# Patient Record
Sex: Female | Born: 1980
Health system: Southern US, Community
[De-identification: ages and names within clinical notes are randomized; demographics above are authoritative.]

## PROBLEM LIST (undated history)

## (undated) DIAGNOSIS — R519 Headache, unspecified: Secondary | ICD-10-CM

## (undated) DIAGNOSIS — F329 Major depressive disorder, single episode, unspecified: Secondary | ICD-10-CM

## (undated) DIAGNOSIS — F419 Anxiety disorder, unspecified: Secondary | ICD-10-CM

## (undated) DIAGNOSIS — N83209 Unspecified ovarian cyst, unspecified side: Secondary | ICD-10-CM

## (undated) DIAGNOSIS — K602 Anal fissure, unspecified: Secondary | ICD-10-CM

## (undated) DIAGNOSIS — R112 Nausea with vomiting, unspecified: Secondary | ICD-10-CM

## (undated) DIAGNOSIS — I7300 Raynaud's syndrome without gangrene: Secondary | ICD-10-CM

## (undated) DIAGNOSIS — Z9889 Other specified postprocedural states: Secondary | ICD-10-CM

## (undated) DIAGNOSIS — K219 Gastro-esophageal reflux disease without esophagitis: Secondary | ICD-10-CM

## (undated) DIAGNOSIS — R51 Headache: Secondary | ICD-10-CM

## (undated) DIAGNOSIS — K922 Gastrointestinal hemorrhage, unspecified: Secondary | ICD-10-CM

## (undated) DIAGNOSIS — D759 Disease of blood and blood-forming organs, unspecified: Secondary | ICD-10-CM

## (undated) DIAGNOSIS — F32A Depression, unspecified: Secondary | ICD-10-CM

## (undated) DIAGNOSIS — U071 COVID-19: Secondary | ICD-10-CM

## (undated) DIAGNOSIS — D649 Anemia, unspecified: Secondary | ICD-10-CM

## (undated) DIAGNOSIS — K5090 Crohn's disease, unspecified, without complications: Secondary | ICD-10-CM

## (undated) DIAGNOSIS — Z8489 Family history of other specified conditions: Secondary | ICD-10-CM

## (undated) DIAGNOSIS — D6859 Other primary thrombophilia: Secondary | ICD-10-CM

## (undated) HISTORY — PX: ILEOCECETOMY: SHX5857

## (undated) HISTORY — PX: ESOPHAGOGASTRODUODENOSCOPY ENDOSCOPY: SHX5814

## (undated) HISTORY — DX: Anemia, unspecified: D64.9

## (undated) HISTORY — DX: Major depressive disorder, single episode, unspecified: F32.9

## (undated) HISTORY — DX: Depression, unspecified: F32.A

## (undated) HISTORY — DX: Crohn's disease, unspecified, without complications: K50.90

## (undated) HISTORY — PX: WISDOM TOOTH EXTRACTION: SHX21

## (undated) HISTORY — DX: COVID-19: U07.1

## (undated) HISTORY — DX: Raynaud's syndrome without gangrene: I73.00

## (undated) HISTORY — DX: Anal fissure, unspecified: K60.2

## (undated) HISTORY — DX: Anxiety disorder, unspecified: F41.9

## (undated) HISTORY — DX: Gastrointestinal hemorrhage, unspecified: K92.2

## (undated) HISTORY — PX: COLONOSCOPY: SHX174

## (undated) HISTORY — DX: Unspecified ovarian cyst, unspecified side: N83.209

---

## 2002-04-15 ENCOUNTER — Emergency Department (HOSPITAL_COMMUNITY): Admission: EM | Admit: 2002-04-15 | Discharge: 2002-04-15 | Payer: Self-pay | Admitting: Emergency Medicine

## 2004-03-18 ENCOUNTER — Other Ambulatory Visit: Admission: RE | Admit: 2004-03-18 | Discharge: 2004-03-18 | Payer: Self-pay | Admitting: Obstetrics and Gynecology

## 2004-08-02 ENCOUNTER — Emergency Department (HOSPITAL_COMMUNITY): Admission: EM | Admit: 2004-08-02 | Discharge: 2004-08-02 | Payer: Self-pay | Admitting: Emergency Medicine

## 2005-03-04 ENCOUNTER — Encounter: Admission: RE | Admit: 2005-03-04 | Discharge: 2005-03-04 | Payer: Self-pay | Admitting: Internal Medicine

## 2005-03-17 ENCOUNTER — Emergency Department (HOSPITAL_COMMUNITY): Admission: EM | Admit: 2005-03-17 | Discharge: 2005-03-17 | Payer: Self-pay | Admitting: Emergency Medicine

## 2005-03-31 ENCOUNTER — Ambulatory Visit: Payer: Self-pay | Admitting: Internal Medicine

## 2005-04-19 ENCOUNTER — Ambulatory Visit: Payer: Self-pay | Admitting: Internal Medicine

## 2005-04-19 ENCOUNTER — Encounter (INDEPENDENT_AMBULATORY_CARE_PROVIDER_SITE_OTHER): Payer: Self-pay | Admitting: Specialist

## 2005-04-20 ENCOUNTER — Ambulatory Visit (HOSPITAL_COMMUNITY): Admission: RE | Admit: 2005-04-20 | Discharge: 2005-04-20 | Payer: Self-pay | Admitting: Internal Medicine

## 2005-05-05 ENCOUNTER — Ambulatory Visit: Payer: Self-pay | Admitting: Internal Medicine

## 2005-05-17 ENCOUNTER — Other Ambulatory Visit: Admission: RE | Admit: 2005-05-17 | Discharge: 2005-05-17 | Payer: Self-pay | Admitting: Obstetrics and Gynecology

## 2005-06-01 ENCOUNTER — Ambulatory Visit: Payer: Self-pay | Admitting: Internal Medicine

## 2005-07-13 ENCOUNTER — Ambulatory Visit: Payer: Self-pay | Admitting: Internal Medicine

## 2005-08-02 ENCOUNTER — Encounter (HOSPITAL_COMMUNITY): Admission: RE | Admit: 2005-08-02 | Discharge: 2005-09-08 | Payer: Self-pay | Admitting: Internal Medicine

## 2005-12-13 ENCOUNTER — Emergency Department (HOSPITAL_COMMUNITY): Admission: EM | Admit: 2005-12-13 | Discharge: 2005-12-14 | Payer: Self-pay | Admitting: Emergency Medicine

## 2006-06-23 ENCOUNTER — Inpatient Hospital Stay (HOSPITAL_COMMUNITY): Admission: AD | Admit: 2006-06-23 | Discharge: 2006-06-23 | Payer: Self-pay | Admitting: Obstetrics and Gynecology

## 2006-07-07 ENCOUNTER — Inpatient Hospital Stay (HOSPITAL_COMMUNITY): Admission: AD | Admit: 2006-07-07 | Discharge: 2006-07-07 | Payer: Self-pay | Admitting: Obstetrics and Gynecology

## 2006-07-11 ENCOUNTER — Inpatient Hospital Stay (HOSPITAL_COMMUNITY): Admission: AD | Admit: 2006-07-11 | Discharge: 2006-07-11 | Payer: Self-pay | Admitting: Obstetrics and Gynecology

## 2006-07-26 ENCOUNTER — Inpatient Hospital Stay (HOSPITAL_COMMUNITY): Admission: AD | Admit: 2006-07-26 | Discharge: 2006-07-26 | Payer: Self-pay | Admitting: *Deleted

## 2006-07-29 ENCOUNTER — Inpatient Hospital Stay (HOSPITAL_COMMUNITY): Admission: AD | Admit: 2006-07-29 | Discharge: 2006-07-29 | Payer: Self-pay | Admitting: *Deleted

## 2006-07-31 ENCOUNTER — Inpatient Hospital Stay (HOSPITAL_COMMUNITY): Admission: AD | Admit: 2006-07-31 | Discharge: 2006-07-31 | Payer: Self-pay | Admitting: Obstetrics and Gynecology

## 2006-08-08 ENCOUNTER — Inpatient Hospital Stay (HOSPITAL_COMMUNITY): Admission: AD | Admit: 2006-08-08 | Discharge: 2006-08-12 | Payer: Self-pay | Admitting: Obstetrics and Gynecology

## 2006-08-10 ENCOUNTER — Encounter (INDEPENDENT_AMBULATORY_CARE_PROVIDER_SITE_OTHER): Payer: Self-pay | Admitting: *Deleted

## 2006-11-22 ENCOUNTER — Ambulatory Visit: Payer: Self-pay | Admitting: Internal Medicine

## 2006-11-22 LAB — CONVERTED CEMR LAB
ALT: 22 units/L (ref 0–35)
AST: 18 units/L (ref 0–37)
Albumin: 3.5 g/dL (ref 3.5–5.2)
Alkaline Phosphatase: 86 units/L (ref 39–117)
BUN: 9 mg/dL (ref 6–23)
Basophils Absolute: 0.1 10*3/uL (ref 0.0–0.1)
Basophils Relative: 0.7 % (ref 0.0–1.0)
Bilirubin, Direct: 0.1 mg/dL (ref 0.0–0.3)
CO2: 31 meq/L (ref 19–32)
Calcium: 8.7 mg/dL (ref 8.4–10.5)
Chloride: 100 meq/L (ref 96–112)
Creatinine, Ser: 0.9 mg/dL (ref 0.4–1.2)
Eosinophils Absolute: 0.3 10*3/uL (ref 0.0–0.6)
Eosinophils Relative: 4.1 % (ref 0.0–5.0)
GFR calc Af Amer: 98 mL/min
GFR calc non Af Amer: 81 mL/min
Glucose, Bld: 83 mg/dL (ref 70–99)
HCT: 40.6 % (ref 36.0–46.0)
Hemoglobin: 13.6 g/dL (ref 12.0–15.0)
Lymphocytes Relative: 35.3 % (ref 12.0–46.0)
MCHC: 33.6 g/dL (ref 30.0–36.0)
MCV: 80.3 fL (ref 78.0–100.0)
Monocytes Absolute: 0.6 10*3/uL (ref 0.2–0.7)
Monocytes Relative: 7.1 % (ref 3.0–11.0)
Neutro Abs: 4.4 10*3/uL (ref 1.4–7.7)
Neutrophils Relative %: 52.8 % (ref 43.0–77.0)
Platelets: 363 10*3/uL (ref 150–400)
Potassium: 4.3 meq/L (ref 3.5–5.1)
RBC: 5.05 M/uL (ref 3.87–5.11)
RDW: 13.7 % (ref 11.5–14.6)
Sed Rate: 18 mm/hr (ref 0–25)
Sodium: 142 meq/L (ref 135–145)
Total Bilirubin: 0.5 mg/dL (ref 0.3–1.2)
Total Protein: 6.7 g/dL (ref 6.0–8.3)
WBC: 8.3 10*3/uL (ref 4.5–10.5)

## 2007-01-15 ENCOUNTER — Ambulatory Visit: Payer: Self-pay | Admitting: Internal Medicine

## 2007-01-19 ENCOUNTER — Encounter: Admission: RE | Admit: 2007-01-19 | Discharge: 2007-01-19 | Payer: Self-pay | Admitting: Internal Medicine

## 2007-09-03 DIAGNOSIS — Z8719 Personal history of other diseases of the digestive system: Secondary | ICD-10-CM | POA: Insufficient documentation

## 2007-09-03 DIAGNOSIS — D649 Anemia, unspecified: Secondary | ICD-10-CM | POA: Insufficient documentation

## 2007-09-03 DIAGNOSIS — F3289 Other specified depressive episodes: Secondary | ICD-10-CM | POA: Insufficient documentation

## 2007-09-03 DIAGNOSIS — N83209 Unspecified ovarian cyst, unspecified side: Secondary | ICD-10-CM | POA: Insufficient documentation

## 2007-09-03 DIAGNOSIS — F329 Major depressive disorder, single episode, unspecified: Secondary | ICD-10-CM | POA: Insufficient documentation

## 2007-09-03 DIAGNOSIS — J45909 Unspecified asthma, uncomplicated: Secondary | ICD-10-CM | POA: Insufficient documentation

## 2007-10-01 ENCOUNTER — Emergency Department (HOSPITAL_COMMUNITY): Admission: EM | Admit: 2007-10-01 | Discharge: 2007-10-01 | Payer: Self-pay | Admitting: Emergency Medicine

## 2007-11-26 ENCOUNTER — Telehealth: Payer: Self-pay | Admitting: Internal Medicine

## 2007-12-17 DIAGNOSIS — R197 Diarrhea, unspecified: Secondary | ICD-10-CM | POA: Insufficient documentation

## 2007-12-20 ENCOUNTER — Ambulatory Visit: Payer: Self-pay | Admitting: Internal Medicine

## 2007-12-21 LAB — CONVERTED CEMR LAB
Basophils Absolute: 0 10*3/uL (ref 0.0–0.1)
Basophils Relative: 0.5 % (ref 0.0–3.0)
Eosinophils Absolute: 0.1 10*3/uL (ref 0.0–0.7)
Eosinophils Relative: 1 % (ref 0.0–5.0)
HCT: 36.5 % (ref 36.0–46.0)
Hemoglobin: 12.5 g/dL (ref 12.0–15.0)
Iron: 20 ug/dL — ABNORMAL LOW (ref 42–145)
Lymphocytes Relative: 31.2 % (ref 12.0–46.0)
MCHC: 34.1 g/dL (ref 30.0–36.0)
MCV: 77.6 fL — ABNORMAL LOW (ref 78.0–100.0)
Monocytes Absolute: 0.9 10*3/uL (ref 0.1–1.0)
Monocytes Relative: 10.3 % (ref 3.0–12.0)
Neutro Abs: 4.8 10*3/uL (ref 1.4–7.7)
Neutrophils Relative %: 57 % (ref 43.0–77.0)
Platelets: 423 10*3/uL — ABNORMAL HIGH (ref 150–400)
RBC: 4.71 M/uL (ref 3.87–5.11)
RDW: 14 % (ref 11.5–14.6)
Saturation Ratios: 5.2 % — ABNORMAL LOW (ref 20.0–50.0)
Sed Rate: 37 mm/hr — ABNORMAL HIGH (ref 0–22)
Transferrin: 275.4 mg/dL (ref >=212.0)
Vitamin B-12: 229 pg/mL (ref 211–911)
WBC: 8.4 10*3/uL (ref 4.5–10.5)

## 2007-12-25 ENCOUNTER — Ambulatory Visit: Payer: Self-pay | Admitting: Internal Medicine

## 2008-01-03 ENCOUNTER — Telehealth: Payer: Self-pay | Admitting: Internal Medicine

## 2008-01-04 ENCOUNTER — Telehealth: Payer: Self-pay | Admitting: Internal Medicine

## 2008-01-25 ENCOUNTER — Ambulatory Visit: Payer: Self-pay | Admitting: Internal Medicine

## 2008-02-25 ENCOUNTER — Ambulatory Visit: Payer: Self-pay | Admitting: Internal Medicine

## 2008-03-26 ENCOUNTER — Ambulatory Visit: Payer: Self-pay | Admitting: Internal Medicine

## 2008-03-27 ENCOUNTER — Telehealth: Payer: Self-pay | Admitting: Internal Medicine

## 2008-04-28 ENCOUNTER — Ambulatory Visit: Payer: Self-pay | Admitting: Internal Medicine

## 2008-05-28 ENCOUNTER — Ambulatory Visit: Payer: Self-pay | Admitting: Internal Medicine

## 2008-06-09 ENCOUNTER — Ambulatory Visit: Payer: Self-pay | Admitting: Internal Medicine

## 2008-06-10 LAB — CONVERTED CEMR LAB
Basophils Absolute: 0 10*3/uL (ref 0.0–0.1)
Basophils Relative: 0.4 % (ref 0.0–3.0)
Eosinophils Absolute: 0.1 10*3/uL (ref 0.0–0.7)
Eosinophils Relative: 2.6 % (ref 0.0–5.0)
HCT: 37.8 % (ref 36.0–46.0)
Hemoglobin: 12.9 g/dL (ref 12.0–15.0)
Iron: 30 ug/dL — ABNORMAL LOW (ref 42–145)
Lymphocytes Relative: 29.6 % (ref 12.0–46.0)
MCHC: 34 g/dL (ref 30.0–36.0)
MCV: 79.5 fL (ref 78.0–100.0)
Monocytes Absolute: 0.6 10*3/uL (ref 0.1–1.0)
Monocytes Relative: 10 % (ref 3.0–12.0)
Neutro Abs: 3.2 10*3/uL (ref 1.4–7.7)
Neutrophils Relative %: 57.4 % (ref 43.0–77.0)
Platelets: 264 10*3/uL (ref 150–400)
RBC: 4.76 M/uL (ref 3.87–5.11)
RDW: 15.1 % — ABNORMAL HIGH (ref 11.5–14.6)
Saturation Ratios: 7.9 % — ABNORMAL LOW (ref 20.0–50.0)
Sed Rate: 20 mm/hr (ref 0–22)
Transferrin: 270.6 mg/dL (ref >=212.0)
Vitamin B-12: 380 pg/mL (ref 211–911)
WBC: 5.6 10*3/uL (ref 4.5–10.5)

## 2008-06-11 ENCOUNTER — Telehealth: Payer: Self-pay | Admitting: Internal Medicine

## 2008-06-18 ENCOUNTER — Ambulatory Visit: Payer: Self-pay | Admitting: Internal Medicine

## 2008-09-17 ENCOUNTER — Telehealth: Payer: Self-pay | Admitting: Internal Medicine

## 2009-01-01 ENCOUNTER — Encounter: Payer: Self-pay | Admitting: Internal Medicine

## 2009-06-19 ENCOUNTER — Telehealth: Payer: Self-pay | Admitting: Internal Medicine

## 2009-06-22 ENCOUNTER — Ambulatory Visit: Payer: Self-pay | Admitting: Internal Medicine

## 2009-06-22 DIAGNOSIS — K602 Anal fissure, unspecified: Secondary | ICD-10-CM | POA: Insufficient documentation

## 2009-06-22 LAB — CONVERTED CEMR LAB
ALT: 17 units/L (ref 0–35)
AST: 18 units/L (ref 0–37)
Albumin: 3.7 g/dL (ref 3.5–5.2)
Alkaline Phosphatase: 78 units/L (ref 39–117)
BUN: 4 mg/dL — ABNORMAL LOW (ref 6–23)
Basophils Absolute: 0 10*3/uL (ref 0.0–0.1)
Basophils Relative: 0.4 % (ref 0.0–3.0)
CO2: 27 meq/L (ref 19–32)
Calcium: 8.9 mg/dL (ref 8.4–10.5)
Chloride: 105 meq/L (ref 96–112)
Creatinine, Ser: 0.7 mg/dL (ref 0.4–1.2)
Eosinophils Absolute: 0.1 10*3/uL (ref 0.0–0.7)
Eosinophils Relative: 1.6 % (ref 0.0–5.0)
GFR calc non Af Amer: 105.75 mL/min (ref >60)
Glucose, Bld: 84 mg/dL (ref 70–99)
HCT: 40.5 % (ref 36.0–46.0)
Hemoglobin: 13.4 g/dL (ref 12.0–15.0)
Iron: 29 ug/dL — ABNORMAL LOW (ref 42–145)
Lymphocytes Relative: 25.4 % (ref 12.0–46.0)
Lymphs Abs: 1.9 10*3/uL (ref 0.7–4.0)
MCHC: 33.1 g/dL (ref 30.0–36.0)
MCV: 80.1 fL (ref 78.0–100.0)
Monocytes Absolute: 0.5 10*3/uL (ref 0.1–1.0)
Monocytes Relative: 6.5 % (ref 3.0–12.0)
Neutro Abs: 4.8 10*3/uL (ref 1.4–7.7)
Neutrophils Relative %: 66.1 % (ref 43.0–77.0)
Platelets: 335 10*3/uL (ref 150.0–400.0)
Potassium: 3.8 meq/L (ref 3.5–5.1)
RBC: 5.05 M/uL (ref 3.87–5.11)
RDW: 14.5 % (ref 11.5–14.6)
Saturation Ratios: 8.5 % — ABNORMAL LOW (ref 20.0–50.0)
Sed Rate: 39 mm/hr — ABNORMAL HIGH (ref 0–22)
Sodium: 139 meq/L (ref 135–145)
Total Bilirubin: 0.6 mg/dL (ref 0.3–1.2)
Total Protein: 7.7 g/dL (ref 6.0–8.3)
Transferrin: 242.6 mg/dL (ref 212.0–360.0)
Vitamin B-12: 333 pg/mL (ref 211–911)
WBC: 7.3 10*3/uL (ref 4.5–10.5)

## 2009-07-07 ENCOUNTER — Telehealth: Payer: Self-pay | Admitting: Internal Medicine

## 2009-07-20 ENCOUNTER — Telehealth: Payer: Self-pay | Admitting: Internal Medicine

## 2009-08-18 ENCOUNTER — Telehealth: Payer: Self-pay | Admitting: Internal Medicine

## 2009-09-24 ENCOUNTER — Telehealth: Payer: Self-pay | Admitting: Internal Medicine

## 2010-04-24 ENCOUNTER — Inpatient Hospital Stay (HOSPITAL_COMMUNITY): Admission: AD | Admit: 2010-04-24 | Discharge: 2010-04-25 | Payer: Self-pay | Admitting: Obstetrics and Gynecology

## 2010-04-25 ENCOUNTER — Inpatient Hospital Stay (HOSPITAL_COMMUNITY)
Admission: AD | Admit: 2010-04-25 | Discharge: 2010-04-26 | Payer: Self-pay | Source: Home / Self Care | Admitting: Obstetrics and Gynecology

## 2010-06-19 ENCOUNTER — Encounter: Payer: Self-pay | Admitting: Internal Medicine

## 2010-06-29 NOTE — Assessment & Plan Note (Signed)
Summary: LAB F/U.Marland KitchenMarland KitchenAS.       (11:15AM APPT)    History of Present Illness Visit Type: follow up Primary GI MD: [name] MD Primary Provider: Martha Clan, M.D. Chief Complaint: Rectal bleeding, BRB after BM History of Present Illness:   This is a 30 year old white female with Crohn's colitis diagnosed in 2006. Her last colonoscopy in November 2006 showed  active Crohn's disease at the ileocecal valve. Random biopsies of the normal-appearing colon showed granulomas. Patient has had a recent flare up of rectal bleeding and diarrhea which has now responded to Entocort 9 mg a day. She has been on it for one week and the bleeding and diarrhea have improved. In the past, the patient was reluctant to go on 6 MP because of potential side effects. She continues on Pentasa 250 mg 8 tablets a day. She has severe iron deficiency with an iron saturation of 7% but a normal hemoglobin of 12.9 and hematocrit 37.8 on 06/09/08. Patient is intolerant to oral iron and can take only prenatal vitamins and iron. She had an allergic reaction to her iron dextran infusion.   GI Review of Systems    Reports nausea.      Denies abdominal pain, acid reflux, belching, bloating, chest pain, dysphagia with liquids, dysphagia with solids, heartburn, loss of appetite, vomiting, vomiting blood, weight loss, and  weight gain.      Reports diarrhea, hemorrhoids, and  rectal bleeding.     Denies anal fissure, black tarry stools, change in bowel habit, constipation, diverticulosis, fecal incontinence, heme positive stool, irritable bowel syndrome, jaundice, light color stool, liver problems, and  rectal pain.     Prior Medications Reviewed Using: Patient Recall  Updated Prior Medication List: ENTOCORT EC 3 MG  CP24 (BUDESONIDE) Take 3 capsules by mouth once daily PENTASA 250 MG  CR-CAPS (MESALAMINE) 8 tablets by mouth once daily BENTYL 10 MG  CAPS (DICYCLOMINE HCL) 1 tablet up to 3 once daily as needed  Current Allergies  (reviewed today): ! SULFA ! IRON DEXTRAN COMPLEX (IRON DEXTRAN)  Past Medical History:    Reviewed history from 09/03/2007 and no changes required:       Current Problems:        OVARIAN CYST (ICD-620.2)       ANEMIA (ICD-285.9)       ASTHMA (ICD-493.90)       DEPRESSION (ICD-311)       CROHN'S DISEASE, HX OF (ICD-V12.70)         Past Surgical History:    Reviewed history from 12/17/2007 and no changes required:       unremarkable   Family History:    Reviewed history from 12/17/2007 and no changes required:       Family History of Colon Cancer: Uncle       Family History of Heart Disease: Grandfather  Social History:    Reviewed history from 12/20/2007 and no changes required:       Occupation: Librarian, academic       Alcohol Use - no       Illicit Drug Use - no       Patient has never smoked.        Daily Caffeine Use    Review of Systems       Pertinent positive and negative review of systems were noted in the above HPI. All other ROS was otherwise negative.    Vital Signs:  Patient Profile:   30 Years Old Female Height:  62 inches Weight:      118 pounds BMI:     21.66 BSA:     1.53 Pulse rate:   68 / minute Pulse rhythm:   regular BP sitting:   102 / 74  (left arm) Cuff size:   regular  Vitals Entered By: Teresita Madura CMA (June 18, 2008 11:23 AM)                  Physical Exam  General:     alert, oriented. in no distress. Lungs:     Clear throughout to auscultation. Heart:     Regular rate and rhythm; no murmurs, rubs or bruits. Abdomen:     soft abdomen with no tenderness, normoactive bowel sounds. she has an umbilical ring and  tattoo in the right lower quadrant. Rectal:     normal rectal tone. Anoscopic exam shows normal rectal mucosa and no hemorrhoids. Stool is Hemoccult negative and there is no obvious source of bleeding.    Impression & Recommendations:  Problem # 1:  CROHN'S DISEASE, HX OF (ICD-V12.70) Crohn's disease  of the ileocecal valve and the colon responding to Entocort 9 mg a day. Patient seems to be steroid-dependent. She will continue on 9 mg a day for 8 weeks, 6 mg a day for a week, 30 mg a day for 8 weeks. She will be finished by July 2010. We will then consider starting her on 6 mercaptopurine. Another option would be biologicals. She needs to continue her Pentasa and iron supplements.  Problem # 2:  ANEMIA (ICD-285.9) 7% iron saturation. Patient is intolerant to oral and intravenous iron. She will double up on her prenatal vitamins. I would like her to consider birth-control pills for menorrhagia  to minimize the blood loss. She is a patient at  [facility].   Patient Instructions: 1)  patient will ask her gynecologist to consider treating her menorrhagia 2)  Continue prenatal vitamins twice a day. 3)  Continue Pentasa 250 mg 1 g twice a day. 4)  Continue on Entocort 9 mg a day and taper as per schedule. 5)  Office visit 6 months. 6)  Consider 6-mercaptopurine. 7)  Copy Sent To:Rocco Pauls, Dr. Cyndie Mull

## 2010-06-29 NOTE — Assessment & Plan Note (Signed)
Summary: B12 INJECTION...AS.  Nurse Visit    Prior Medications: ENTOCORT EC 3 MG  CP24 (BUDESONIDE) Take 3 capsules by mouth once daily PENTASA 250 MG  CR-CAPS (MESALAMINE) 8 tablets by mouth once daily BENTYL 10 MG  CAPS (DICYCLOMINE HCL) 1 tablet up to 3 once daily as needed PREDNISONE 10 MG  TABS (PREDNISONE) take three tabs by mouth once daily for two weeks METRONIDAZOLE 250 MG  TABS (METRONIDAZOLE) take one by mouth three times a day Current Allergies: ! SULFA ! IRON DEXTRAN COMPLEX (IRON DEXTRAN)    Medication Administration  Injection # 1:    Medication: Vit B12 1000 mcg    Diagnosis: ANEMIA (ICD-285.9)    Route: IM    Site: L deltoid    Exp Date: 10/28/2009    Lot #: [id number]    Mfr: [employer] Regent    Comments: Next B12 scheduled for 03/26/08 at 8:30am    Patient tolerated injection without complications    Given by: Christie Nottingham CMA (February 25, 2008 8:37 AM)  Orders Added: 1)  Vit B12 1000 mcg [email or web address]    ]

## 2010-06-29 NOTE — Progress Notes (Signed)
Summary: TRIAGE-Abdominal Pain   Phone Note Call from Patient Call back at Home Phone [phone]   Caller: Patient Summary of Call: patient complains of right side abdominal pain that started yesterday. She is also preg. but she does not feel that it is related to her preg she is trying to decide if she should to the ER. She just saw the OB this AM and told her about the pains.  Initial call taken by: Harlow Mares CMA Duncan Dull),  September 24, 2009 12:13 PM  Follow-up for Phone Call        Since yesterday pt. c/o intermittent RUQ pain. Pain is worse when she eats. Some nausea/vomited this morning, thinks this may be from pregnancy. Cold/shaking chills. Bentyl helps somewhat. OBGYN seen this morning, no new orders.  Pt. also c/o severe constipation from the Zofran.  DR.Angeletta Goelz PLEASE ADVISE  Follow-up by: Laureen Ochs LPN,  September 24, 2009 12:21 PM  Additional Follow-up for Phone Call Additional follow up Details #1::        Spoke with the pt, she left work, RLQ pain since yesteday. Has been on more fiber for the "baby" , [redacted] wks pregnant, also constipated from Zofran. We discussed it, ? Entecort, will restart 9 mg/day. She has some at home. Additional Follow-up by: Hart Carwin MD,  September 24, 2009 1:19 PM

## 2010-06-29 NOTE — Assessment & Plan Note (Signed)
Summary: b12inj  Nurse Visit    Medication Administration  Injection # 1:    Medication: Vit B12 1000 mcg    Diagnosis: ANEMIA (ICD-285.9)    Route: IM    Site: R deltoid    Exp Date: 10/2009    Lot #: [id number]    Mfr: [employer] Regent    Comments: Next injection scheduled for 02/25/08    Patient tolerated injection without complications    Given by: Francee Piccolo CMA (January 25, 2008 8:45 AM)  Orders Added: 1)  Vit B12 1000 mcg [email or web address]    ]

## 2010-06-29 NOTE — Progress Notes (Signed)
Summary: TRIAGE--Abd Pain   Phone Note Call from Patient Call back at Home Phone [phone] Call back at Work Phone [phone] Call back at Try work first please   Call For: Dr Juanda Chance Summary of Call: Really bad stomach pains x3days not getting better with the medicines she has. Gets nauseaus with everything she eats. Initial call taken by: Leanor Kail [facility],  September 17, 2008 8:06 AM  Follow-up for Phone Call        Pt. having abd. pain, "my entire stomache cramps up" Usually happens during or following a meal. Also severe nausea, may last up to 2 hours. Denies fever, constipation, diarrhea, blood,black stools. Takes Pentasa 8 tabs daily, Bentyl 10mg  three times a day. Had tapered off Entocort, but resumed it yesterday. Pt. states her Mother and sister had these same symptoms and ended up having their gallbladders removed. DR.Christyl Osentoski PLEASE ADVISE Follow-up by: Laureen Ochs LPN,  September 17, 2008 8:37 AM  Additional Follow-up for Phone Call Additional follow up Details #1::        Upper abd. sono 12/2006 was negative, so I don't think it is her gall bladder. I agree with Entecort 3 mg daily. for several weeks      Appended Document: TRIAGE--Abd Pain Above MD order reviewed w/pt.  She will continue above meds., follow a soft,bland diet and callback as needed.

## 2010-06-29 NOTE — Progress Notes (Signed)
Summary: TRIAGE  Medications Added ANUSOL-HC 2.5 %  CREA (HYDROCORTISONE) Apply to rectum up to 4 times daily-as needed.       Phone Note Call from Patient Call back at Home Phone [phone]   Caller: Patient Call For: Juanda Chance Reason for Call: Talk to Nurse Summary of Call: Patient states that she has hemorroids and would like an rx called in to [facility] [address] in [place]. Initial call taken by: Tawni Levy,  June 19, 2009 8:07 AM  Follow-up for Phone Call        Message left for patient to callback.Laureen Ochs LPN  June 19, 2009 9:01 AM   Pt. c/o  external hemorrhoids for three days, they are painful, bleeding. States this is not a Crohn's flare up.   1) Anusol HC cream to rectum QID as needed 2) Sitz bath QID 3)See Dr.Cristalle Rohm on 06-22-09 ar 10:15am 4) If symptoms become worse call back immediately or go to ER.  Follow-up by: Laureen Ochs LPN,  June 19, 2009 2:48 PM  Additional Follow-up for Phone Call Additional follow up Details #1::        reviewed and agree Additional Follow-up by: Hart Carwin MD,  June 19, 2009 9:40 PM    New/Updated Medications: ANUSOL-HC 2.5 %  CREA (HYDROCORTISONE) Apply to rectum up to 4 times daily-as needed. Prescriptions: ANUSOL-HC 2.5 %  CREA (HYDROCORTISONE) Apply to rectum up to 4 times daily-as needed.  #1 x 1   Entered by:   Laureen Ochs LPN   Authorized by:   Hart Carwin MD   Signed by:   Laureen Ochs LPN on 09/81/1914   Method used:   Electronically to        [facility] [address] (retail)       [address] [place], [place]  [address]       Ph: [phone]       Fax: [phone]   RxID:   [id number]

## 2010-06-29 NOTE — Procedures (Signed)
Summary: COLON PATH   Colonoscopy  Procedure date:  04/24/2005  Findings:      Location:  [facility].    SP Surgical Pathology - STATUS: Final             By: SMIR MD , Jessica Priest           Perform Date: 21Nov06 00:01  Ordered By: Juanda Chance MD , Ulysees Robarts M            Ordered Date: 21Nov06 21:36  Facility: [facility]                               Department: CPATH  Service Report Text  [facility]   [address] [address]   [place], [place] [address]   Telephone [phone] or [phone] Fax [phone]    REPORT OF SURGICAL PATHOLOGY    Case #: [id number]   Patient Name: Sophia Beard, Sophia Beard   PID: [id number]   Pathologist: Havery Moros, MD   DOB/Age [birth year] (Age: 30) Gender: F   Date Taken: 04/19/2005   Date Received: 04/19/2005    FINAL DIAGNOSIS    ***MICROSCOPIC EXAMINATION AND DIAGNOSIS***    1. ILEOCECAL VALVE, BIOPSY: ACTIVE INFLAMMATION, SEE COMMENT    2. COLON, RANDOM BIOPSIES: BENIGN COLONIC MUCOSA WITH A SMALL   GRANULOMA, SEE COMMENT    COMMENT   1. Sections of the ileocecal valve show active inflammation in   the form of increased numbers of neutrophils in the lamina   propria and epithelium associated with cryptitis and focal crypt   abscesses. There is a small focus displaying multinucleated giant   cell possibly representing a small granuloma.    2. The sections show benign colonic mucosa with lack of   significant inflammation. Nonetheless there is a small focus of   epithelioid granuloma present in the lamina propria. Other areas   display loose aggregates of histiocytes.    Given the combined features, the findings favor Crohn' s disease.   However, clinical and endoscopic correlation is strongly   recommended. (BNS:kcv 04-20-05)    kv   Date Reported: 04/20/2005 Havery Moros, MD   *** Electronically Signed Out By BNS ***    Clinical information   (+) heme in stools / abdominal pain   1,2. R/O Crohn' s disease  (tw)    specimen(s) obtained   1: Ileocecal valve, biopsy   2: Colon, biopsy, random    Gross Description   1. Received in formalin are tan, soft tissue fragments that are   submitted in toto. Number: multiple   Size: 0.1 to 0.4 cm, 1 block submitted.    2. Received in formalin are tan, soft tissue fragments that are   submitted in toto. Number: multiple   Size: 0.4 to 1.1 cm, 1 block submitted. (lc:tw 04/19/05)

## 2010-06-29 NOTE — Progress Notes (Signed)
Summary: ABD PAIN   Phone Note Call from Patient Call back at Home Phone [phone]   Call For: BR Doc Mandala Reason for Call: Talk to Nurse Summary of Call: HAVING RALLY BAD ABD PAIN X4 DAYS GETTING WORSE. TAKING ALL HER MEDS-PENTASA ENTICORT DICICLOMINE NOT REALLY HELPING  Follow-up for Phone Call        last couple days really bad cramping all the way across after eating, taking all meds and not helping. Rectal pain when BMS, alot of diarrhea 3-4 times a day. blood on tissue when wiping. Pt states that she is taking Bentyl three times a day but it is not helping at all. no fever alot of cramping in mid abd from left to right. Follow-up by: Harlow Mares CMA,  January 03, 2008 11:15 AM  Additional Follow-up for Phone Call Additional follow up Details #1::        Stop Entecort, and start Prednisone 30mg /day for 2 weeks, then call, Please, also call in Flagyl 250 mg three times a day x 1 week,# 21 Additional Follow-up by: Hart Carwin MD,  January 03, 2008 5:32 PM        Appended Document: ABD PAIN see next phone note.Marland Kitchen all addressed with the pt.

## 2010-06-29 NOTE — Assessment & Plan Note (Signed)
Summary: b12 injection  Nurse Visit    Prior Medications: ENTOCORT EC 3 MG  CP24 (BUDESONIDE) Take 3 capsules by mouth once daily PENTASA 250 MG  CR-CAPS (MESALAMINE) 8 tablets by mouth once daily BENTYL 10 MG  CAPS (DICYCLOMINE HCL) 1 tablet up to 3 once daily as needed PREDNISONE 10 MG  TABS (PREDNISONE) take three tabs by mouth once daily for two weeks METRONIDAZOLE 250 MG  TABS (METRONIDAZOLE) take one by mouth three times a day Current Allergies: ! SULFA ! IRON DEXTRAN COMPLEX (IRON DEXTRAN)    Medication Administration  Injection # 1:    Medication: Vit B12 1000 mcg    Diagnosis: ANEMIA (ICD-285.9)    Route: IM    Site: R deltoid    Exp Date: 8/11    Lot #: [id number]    Mfr: [employer] Regent    Patient tolerated injection without complications    Given by: Hortense Ramal CMA (May 28, 2008 10:34 AM)  Orders Added: 1)  Vit B12 1000 mcg [email or web address]    ]

## 2010-06-29 NOTE — Assessment & Plan Note (Signed)
Summary: PAINFUL,BLEEDING EXTERNAL HEMORRHOIDS             DEBORAH    History of Present Illness Visit Type: Follow-up Visit Primary GI MD: [name] MD Primary Provider: Martha Clan, M.D. Chief Complaint: Patient complains of rectal pain and BRB associated with hemorroids. She has been having this problems for about one week. She has also been having some diarrhea. Patient stopped Pentasa about 6 months ago.   History of Present Illness:   This is a 30 year old white female with Crohn's colitis diagnosed in 2006. Her last colonoscopy in November 2006 showed active Crohn's disease at the ileocecal valve. Random biopsies of the normal-appearing colon showed granulomas. She has severe iron deficiency with her last iron saturation being 7% with a normal hemoglobin of 12.9 and hematocrit 37.8 on 06/09/08. Patient is intolerant to oral iron and can take only prenatal vitamins and iron. She had an allergic reaction to her iron dextran infusion. She has been having multiple arthralgias involving her wrists, elbows, knees and ankles. She has an appointment with her rheumatologist in March. There has been increasing diarrhea and crampy abdominal pain. She denies any rectal bleeding. Her last flareup of Crohn's disease was in April 2010. Her last colonoscopy was in 2006 and showed active Crohn's disease at the ileocecal valve and granulomas throughout the colon. She is interested in getting pregnant again and is asking for approval as far as her medications are concerned.   GI Review of Systems      Denies abdominal pain, acid reflux, belching, bloating, chest pain, dysphagia with liquids, dysphagia with solids, heartburn, loss of appetite, nausea, vomiting, vomiting blood, weight loss, and  weight gain.      Reports diarrhea, hemorrhoids, rectal bleeding, and  rectal pain.     Denies anal fissure, black tarry stools, change in bowel habit, constipation, diverticulosis, fecal incontinence, heme positive  stool, irritable bowel syndrome, jaundice, light color stool, and  liver problems.    Current Medications (verified): 1)  Bentyl 10 Mg  Caps (Dicyclomine Hcl) .Marland Kitchen.. 1 Tablet Up To 3 Once Daily As Needed 2)  Anusol-Hc 2.5 %  Crea (Hydrocortisone) .... Apply To Rectum Up To 4 Times Daily-As Needed. 3)  Prenatal Plus/iron 27-1 Mg Tabs (Prenatal Vit-Fe Fumarate-Fa) .... Take One By Mouth Once Daily 4)  Ibuprofen 200 Mg Tabs (Ibuprofen) .... Take One By Mouth As Needed  Allergies (verified): 1)  ! Sulfa 2)  ! Iron Dextran Complex (Iron Dextran)  Past History:  Past Medical History: Reviewed history from 09/03/2007 and no changes required. Current Problems:  OVARIAN CYST (ICD-620.2) ANEMIA (ICD-285.9) ASTHMA (ICD-493.90) DEPRESSION (ICD-311) CROHN'S DISEASE, HX OF (ICD-V12.70)  Past Surgical History: Reviewed history from 12/17/2007 and no changes required. unremarkable  Family History: Family History of Colon Cancer: paternal Uncle Family History of Heart Disease: paternal Grandfather  Social History: Occupation: Librarian, academic Alcohol Use - no Illicit Drug Use - no Patient has never smoked.  Daily Caffeine Use 2 cans per day  Review of Systems       The patient complains of arthritis/joint pain, menstrual pain, muscle pains/cramps, skin rash, and swelling of feet/legs.  The patient denies allergy/sinus, anemia, anxiety-new, back pain, blood in urine, breast changes/lumps, change in vision, confusion, cough, coughing up blood, depression-new, fainting, fatigue, fever, headaches-new, hearing problems, heart murmur, heart rhythm changes, itching, night sweats, nosebleeds, pregnancy symptoms, shortness of breath, sleeping problems, sore throat, swollen lymph glands, thirst - excessive , urination - excessive ,  urination changes/pain, urine leakage, vision changes, and voice change.         Pertinent positive and negative review of systems were noted in the above HPI. All other ROS  was otherwise negative.   Vital Signs:  Patient profile:   30 year old female Height:      62 inches Weight:      120.8 pounds BMI:     22.17 Pulse rate:   89 / minute Pulse rhythm:   regular BP sitting:   110 / 66  (left arm) Cuff size:   regular  Vitals Entered By: Harlow Mares CMA Duncan Dull) (June 22, 2009 10:47 AM)  Physical Exam  General:  Well developed, well nourished, no acute distress. Eyes:  PERRLA, no icterus. Mouth:  healed aphthous ulcers. Neck:  Supple; no masses or thyromegaly. Lungs:  Clear throughout to auscultation. Heart:  Regular rate and rhythm; no murmurs, rubs,  or bruits. Abdomen:  soft abdomen but epigastric tenderness and normoactive bowel sounds. No distention. The right lower quadrant, left lower quadrant on normal. Rectal:  rectal and anoscopic exam reveals short  anal fissure at  3 o'clock, 1 cm mlong  with no active bleeding. Small internal hemorrhoids not active. Stool is Hemoccult negative. Extremities:  no edema no abnormalities of the joints. Skin:  Intact without significant lesions or rashes. Psych:  Alert and cooperative. Normal mood and affect.   Impression & Recommendations:  Problem # 1:  CROHN'S DISEASE, HX OF (ICD-V12.70) Patient hs Crohn's colitis of the ileocecal valve confirmed by a colonoscopy in 2006. Now, she is having an exacerbation of arthritis. She has had GI symptoms as well as aphthous stomatitis. We will restart Entocort 9 mg daily and consider adding Pentasa. We will obtain lab tests today as well. She has an appointment with DR Peggye Form  in March. In terms of her getting pregnant, none of the medications that she is taking are contraindicated for conception. I will try to taper down her steroids if she does become pregnant. Orders: TLB-CBC Platelet - w/Differential (85025-CBCD) TLB-CMP (Comprehensive Metabolic Pnl) (80053-COMP) TLB-Sedimentation Rate (ESR) (85652-ESR) TLB-IBC Pnl (Iron/FE;Transferrin)  (83550-IBC) TLB-B12, Serum-Total ONLY (98119-J47)  Problem # 2:  ANAL FISSURE (ICD-565.0) This may be part of her Crohn's disease. We will continue Anusol 2.5% cream 2-3 times a day.  Problem # 3:  ANEMIA (ICD-285.9) check CBC today Orders: TLB-IBC Pnl (Iron/FE;Transferrin) (83550-IBC) TLB-B12, Serum-Total ONLY (82956-O13)  Patient Instructions: 1)  refill Bentyl 10 mg 3 times a day. 2)  Entocort 9 mg daily x 4 weeks 6 mg daily x 4 weeks then 30 mg daily x 4 weeks. 3)  CBC, iron studies, B12, sedimentation rate and metabolic panel. 4)  Consider adding Pentasa. 5)  Copy sent to : Dr D.Clelia Croft Prescriptions: BENTYL 10 MG  CAPS (DICYCLOMINE HCL) 1 tablet up to 3 tablets by mouth daily as needed  #90 x 1   Entered by:   Hortense Ramal CMA (AAMA)   Authorized by:   Hart Carwin MD   Signed by:   Hortense Ramal CMA (AAMA) on 06/22/2009   Method used:   Electronically to        [facility] [phone][address] (retail)       [address] [place], [place]  [address]       Ph: [phone]       Fax: [phone]   RxID:   [id number] ENTOCORT EC 3 MG XR24H-CAP (BUDESONIDE) Take  3 tablets po qd x 4 weeks. Then, take 2 tablets po qd x 4 weeks. Then, take 1 tablet po once qd x 4 weeks.  #180 x 1   Entered by:   Hortense Ramal CMA (AAMA)   Authorized by:   Hart Carwin MD   Signed by:   Hortense Ramal CMA (AAMA) on 06/22/2009   Method used:   Electronically to        [facility] [phone] (retail)       [address] [place], [place]  [address]       Ph: [phone]       Fax: [phone]   RxID:   [id number]

## 2010-06-29 NOTE — Progress Notes (Signed)
Summary: TRIAGE-Diarrhea   Phone Note Call from Patient Call back at Home Phone [phone]   Caller: Patient Reason for Call: Acute Illness Details for Reason: prob Summary of Call: HAVING PROBLEMS WITH DIARRHEA. IMMODIUM NOT WORKING. -PLEASE ADVISE. Initial call taken by: Zackery Barefoot,  November 26, 2007 3:29 PM  Follow-up for Phone Call        Mesage left for patient to callback. Laureen Ochs LPN  November 26, 2007 3:42 PM           Appended Document: TRIAGE-Diarrhea (Pt. has Crohn's.)  X3 weeks--watery stools,frequency, urgency.occ. abd.pain.  No blood, no mucus, no fever. Using Immodium 2-3 x daily, Bentyl on occasion.Had stopped Pentasa, restarted it "a couple days ago", now taking it #4 tabs two times a day. 1) Soft,bland diet. No spicy,greasy,fried foods. Avoid salads and raw fruit/veggies. Advance diet as tolerated. 2) take Pentasa exactly as directed by Dr.Fabyan Loughmiller 3) take Bentyl three times a day for 5 days, then back to as needed. 4) Immodium 2 QAM and as needed throughout the day. 5) I will call pt., if new orders, after MD reviews.   Appended Document: TRIAGE-Diarrhea I have spoken to the patient, she has been having diarrhea now for several wks, even at night. She has been back on Pentasa 8/day and antispasmodis.    Please call in Entecort 3mg , # 90 3 by mouth once daily x 4 weeks Please make appointm about 4 wkks she will call if not better in 2 weeks  Appended Document: TRIAGE-Diarrhea Med. to pharmacy. Appt. scheduled for 12-20-07 at 2pm. Pt. will callback if no better.

## 2010-06-29 NOTE — Procedures (Signed)
Summary: COLON  Gastroenterology   Imported By: Donneta Romberg 09/04/2007 16:15:28  _____________________________________________________________________  External Attachment:    Type:   Image     Comment:   External Document

## 2010-06-29 NOTE — Progress Notes (Signed)
Summary: f-up  Medications Added PREDNISONE 10 MG  TABS (PREDNISONE) take three tabs by mouth once daily for two weeks METRONIDAZOLE 250 MG  TABS (METRONIDAZOLE) take one by mouth three times a day       Phone Note Call from Patient Call back at Home Phone [phone]   Caller: Patient Call For: Talecia Sherlin Reason for Call: Talk to Nurse Details for Reason: f-up Summary of Call: AWAITING on call back from nurse re: yesterdays ph conversation Initial call taken by: Guadlupe [facility],  January 04, 2008 8:16 AM  Follow-up for Phone Call        rxs sent pt aware, she will call back if not better Follow-up by: Harlow Mares CMA,  January 04, 2008 11:45 AM    New/Updated Medications: PREDNISONE 10 MG  TABS (PREDNISONE) take three tabs by mouth once daily for two weeks METRONIDAZOLE 250 MG  TABS (METRONIDAZOLE) take one by mouth three times a day   Prescriptions: METRONIDAZOLE 250 MG  TABS (METRONIDAZOLE) take one by mouth three times a day  #21 x 0   Entered by:   Harlow Mares CMA   Authorized by:   Hart Carwin MD   Signed by:   Harlow Mares CMA on 01/04/2008   Method used:   Electronically sent to ...       [facility] [address] [phone]*       [address]       [place], [place]  [address]       Ph: [phone] or [phone]       Fax: [phone]   RxID:   [id number] PREDNISONE 10 MG  TABS (PREDNISONE) take three tabs by mouth once daily for two weeks  #50 x 0   Entered by:   Harlow Mares CMA   Authorized by:   Hart Carwin MD   Signed by:   Harlow Mares CMA on 01/04/2008   Method used:   Electronically sent to ...       [facility] [address] [phone]*       [address]       [place], [place]  [address]       Ph: [phone] or [phone]       Fax: [phone]   RxID:   [id number]

## 2010-06-29 NOTE — Assessment & Plan Note (Signed)
Summary: #1 of 6 B12 injections. Pt. will schedule next one at this ap...  Nurse Visit    Prior Medications: ENTOCORT EC 3 MG  CP24 (BUDESONIDE) Take 3 capsules by mouth once daily PENTASA 250 MG  CR-CAPS (MESALAMINE) 8 tablets by mouth once daily BENTYL 10 MG  CAPS (DICYCLOMINE HCL) 1 tablet up to 3 once daily as needed Current Allergies: ! SULFA ! IRON DEXTRAN COMPLEX (IRON DEXTRAN)    Medication Administration  Injection # 1:    Medication: Vit B12 1000 mcg    Diagnosis: ANEMIA (ICD-285.9)    Route: IM    Site: L deltoid    Exp Date: 05/30/2009    Lot #: [id number]    Mfr: American Regent    Patient tolerated injection without complications    Given by: Chales Abrahams CMA (December 25, 2007 8:40 AM)  Orders Added: 1)  Vit B12 1000 mcg [email or web address]    ]

## 2010-06-29 NOTE — Progress Notes (Signed)
Summary: RECTAL BLEEDING   Phone Note Outgoing Call   Call placed by: Laureen Ochs LPN,  June 11, 2008 8:14 AM Call placed to: Patient Summary of Call: I called pt. to advise her to keep her appointmnet with Dr.Cathy Ropp on 06-25-08. She now mentions that she has rectal bleeding daily, even when she just voids. Also c/o mild rectal pain. Denies diarrhea,abd.pain,fever,n/v. I have moved her appointment up to 06-18-08 at 11:15am. DR.Refujio Haymer  PLEASE ADVISE. Initial call taken by: Laureen Ochs LPN,  June 11, 2008 8:17 AM  Follow-up for Phone Call        Please start Entecort 9 mg/day. Follow-up by: Hart Carwin MD,  June 11, 2008 9:05 PM  Additional Follow-up for Phone Call Additional follow up Details #1::        Above MD order reviewed with Morrie Sheldon. She will keep her appointment and c/b as needed. Additional Follow-up by: Laureen Ochs LPN,  June 12, 2008 8:21 AM      Prescriptions: ENTOCORT EC 3 MG  CP24 (BUDESONIDE) Take 3 capsules by mouth once daily  #90 x 3   Entered by:   Laureen Ochs LPN   Authorized by:   Hart Carwin MD   Signed by:   Laureen Ochs LPN on 16/02/9603   Method used:   Electronically to        [facility] [address] [phone]* (retail)       [address]       [place], [place]  [address]       Ph: [phone] or [phone]       Fax: [phone]   RxID:   [id number]

## 2010-06-29 NOTE — Assessment & Plan Note (Signed)
Summary: (2PM APPT) F/U FROM TRIAGE ON 11-26-07   [facility]  Medications Added PENTASA 250 MG  CR-CAPS (MESALAMINE) 8 tablets by mouth once daily BENTYL 10 MG  CAPS (DICYCLOMINE HCL) 1 tablet up to 3 once daily as needed        History of Present Illness Visit Type: follow up Primary GI MD: [name] MD Primary Provider: Martha Clan, M.D. Chief Complaint: f/u from excessive diarrhea 2 wks ago  History of Present Illness:   30 year old white female with Crohn's disease of the terminal ileum diagnosed in 1996, positive IBD markers. History of B12 deficiency and iron deficiency. She had a mild exacerbation of her Crohn's disease which started about 4 weeks ago. We have started her on Entocort 9 mg a day and increased her Pentasa to 1g twice a day. She reports being about 80% improved. She denies rectal bleeding, diarrhea or abdominal pain. Last colonoscopy November 06 establishes the diagnosis of Crohn's disease.   GI Review of Systems    Reports weight loss.   Weight loss of 4 pounds over 6 wks.   Denies abdominal pain, acid reflux, belching, bloating, chest pain, dysphagia with liquids, dysphagia with solids, heartburn, loss of appetite, nausea, vomiting, vomiting blood, and  weight gain.      Reports diarrhea.     Denies anal fissure, black tarry stools, change in bowel habit, constipation, diverticulosis, fecal incontinence, heme positive stool, hemorrhoids, irritable bowel syndrome, jaundice, light color stool, liver problems, rectal bleeding, and  rectal pain.     Prior Medications Reviewed Using: Patient Recall  Updated Prior Medication List: ENTOCORT EC 3 MG  CP24 (BUDESONIDE) Take 3 capsules by mouth once daily PENTASA 250 MG  CR-CAPS (MESALAMINE) 8 tablets by mouth once daily BENTYL 10 MG  CAPS (DICYCLOMINE HCL) 1 tablet up to 3 once daily as needed  Current Allergies (reviewed today): ! SULFA ! IRON DEXTRAN COMPLEX (IRON DEXTRAN)  Past Medical History:    Reviewed  history from 09/03/2007 and no changes required:       Current Problems:        OVARIAN CYST (ICD-620.2)       ANEMIA (ICD-285.9)       ASTHMA (ICD-493.90)       DEPRESSION (ICD-311)       CROHN'S DISEASE, HX OF (ICD-V12.70)         Past Surgical History:    Reviewed history from 12/17/2007 and no changes required:       unremarkable   Family History:    Reviewed history from 12/17/2007 and no changes required:       Family History of Colon Cancer: Uncle       Family History of Heart Disease: Grandfather  Social History:    Reviewed history from 12/17/2007 and no changes required:       Occupation: Librarian, academic       Alcohol Use - no       Illicit Drug Use - no       Patient has never smoked.        Daily Caffeine Use   Risk Factors:  Tobacco use:  never Caffeine use:  3 drinks per day   Review of Systems       The patient complains of cough and muscle pains/cramps.     Vital Signs:  Patient Profile:   30 Years Old Female Height:     62 inches Weight:      112 pounds BMI:  20.56 BSA:     1.50 Pulse rate:   100 / minute Pulse rhythm:   regular BP sitting:   112 / 74  (right arm)  Vitals Entered By: Milford Cage CMA (December 20, 2007 1:58 PM)                  Physical Exam  General:     Well developed, well nourished, no acute distress. Neck:     Supple; no masses or thyromegaly. Lungs:     Clear throughout to auscultation. Heart:     Regular rate and rhythm; no murmurs, rubs,  or bruits. Abdomen:     abdomen was soft flat and relaxed with mild tenderness in right lower quadrant and right middle quadrant without palpable mass, bowel sounds were normal active, left lower and upper quadrants were unremarkable Rectal:     normal perianal area normal rectal tone showed Hemoccult negative stool Extremities:     No clubbing, cyanosis, edema or deformities noted. Neurologic:     Alert and  oriented x4;  grossly normal  neurologically.    Impression & Recommendations:  Problem # 1:  CROHN'S DISEASE, HX OF (ICD-V12.70) Crohn's disease of the  terminal ileum since  6/ 2006 status post recent exacerbation with improvement  as a result of Entocort and increased dose of Pentasa. We will continue the current regimen for another 8 weeks and then slowly taper of the Entocort to 6 mg a day for 4 weeks 3 mg a day for 4 weeks and then discontinue., we will obtain  blood tests today, CBC, sed.rate,B12,Iron studies  Other Orders: TLB-CBC Platelet - w/Differential (85025-CBCD) TLB-IBC Pnl (Iron/FE;Transferrin) (83550-IBC) TLB-B12, Serum-Total ONLY (44034-V42) TLB-Sedimentation Rate (ESR) (85651-ESR)   Patient Instructions: 1)  continue Entocort 9 mg p.o. q.d. until end of September, 6 mg p.o. q.d. until end of October of, Entocort 3 mg p.o. q.d. until end of November 2)  Today CBC sedimentation rate iron studies and B12 3)  Continue Pentasa 2 g a day in divided doses 4)  Office visit 3 months 5)  Copy Sent To:Dr Doug  .Clelia Croft    ]

## 2010-06-29 NOTE — Assessment & Plan Note (Signed)
Summary: monthly b12/lk  Nurse Visit    Prior Medications: ENTOCORT EC 3 MG  CP24 (BUDESONIDE) Take 3 capsules by mouth once daily PENTASA 250 MG  CR-CAPS (MESALAMINE) 8 tablets by mouth once daily BENTYL 10 MG  CAPS (DICYCLOMINE HCL) 1 tablet up to 3 once daily as needed PREDNISONE 10 MG  TABS (PREDNISONE) take three tabs by mouth once daily for two weeks METRONIDAZOLE 250 MG  TABS (METRONIDAZOLE) take one by mouth three times a day Current Allergies: ! SULFA ! IRON DEXTRAN COMPLEX (IRON DEXTRAN)    Medication Administration  Injection # 1:    Medication: Vit B12 1000 mcg    Diagnosis: ANEMIA (ICD-285.9)    Route: IM    Site: L deltoid    Exp Date: 10/28/2009    Lot #: [id number]    Mfr: American Regent    Patient tolerated injection without complications    Given by: Milford Cage CMA (April 28, 2008 8:36 AM)  Orders Added: 1)  Vit B12 1000 mcg [email or web address]    ]

## 2010-06-29 NOTE — Progress Notes (Signed)
Summary: Triage   Phone Note Call from Patient Call back at Home Phone [phone]   Caller: Patient Call For: Dr. Juanda Chance Reason for Call: Talk to Nurse Summary of Call: Pt is calling about decreasing her Entocort. She just found out she is pregnant. Initial call taken by: Karna Christmas,  August 18, 2009 4:39 PM  Follow-up for Phone Call        DR.Massimo Hartland PLEASE ADVISE  Follow-up by: Laureen Ochs LPN,  August 18, 2009 4:42 PM  Additional Follow-up for Phone Call Additional follow up Details #1::        spoke with pt. She will reduce Entecort to 6mg  once daily x 1 week, then 3 mg once daily x 1 week. Will call us as needed. Additional Follow-up by: Hart Carwin MD,  August 18, 2009 5:39 PM

## 2010-06-29 NOTE — Assessment & Plan Note (Signed)
Summary: monthly b12  Nurse Visit    Prior Medications: ENTOCORT EC 3 MG  CP24 (BUDESONIDE) Take 3 capsules by mouth once daily PENTASA 250 MG  CR-CAPS (MESALAMINE) 8 tablets by mouth once daily BENTYL 10 MG  CAPS (DICYCLOMINE HCL) 1 tablet up to 3 once daily as needed PREDNISONE 10 MG  TABS (PREDNISONE) take three tabs by mouth once daily for two weeks METRONIDAZOLE 250 MG  TABS (METRONIDAZOLE) take one by mouth three times a day Current Allergies: ! SULFA ! IRON DEXTRAN COMPLEX (IRON DEXTRAN)    Medication Administration  Injection # 1:    Medication: Vit B12 1000 mcg    Diagnosis: ANEMIA (ICD-285.9)    Route: IM    Site: R deltoid    Exp Date: 12/28/2009    Lot #: [id number]    Mfr: American Regent    Patient tolerated injection without complications    Given by: Harlow Mares CMA (March 26, 2008 8:35 AM)  Orders Added: 1)  Vit B12 1000 mcg [email or web address]    ]

## 2010-06-29 NOTE — Progress Notes (Signed)
Summary: TRIAGE-Abd.pain, bleeding  Medications Added PREDNISONE 10 MG  TABS (PREDNISONE) Take 3 tabs (30mg ) by mouth one time daily for 14 days, then decrease to 2 1/2 tabs (25mg ) daily for 14 days. VICODIN 5-500 MG TABS (HYDROCODONE-ACETAMINOPHEN) Take 1/2 to 1 tab by mouth every 6 hours as needed for pain.       Phone Note Call from Patient Call back at [facility] Phone [phone]   Caller: Patient Call For: Dr. Juanda Chance Reason for Call: Talk to Nurse Summary of Call: Pt. is having bad abdominal pain. Pt is taking Bentyl and it does not seem to be helping. Initial call taken by: Karna Christmas,  July 07, 2009 10:54 AM  Follow-up for Phone Call        Pt. has Crohn's Colitis.  She began with constant lower  abd. pain yesterday, is getting worse. Increased rectal bleeding. Bentyl three times a day is not helping. She also takes Pentasa 4 two times a day and Entocort 9mg  daily. Denies fever, n/v.  DR.Curtina Grills PLEASE ADVISE  Follow-up by: Laureen Ochs LPN,  July 07, 2009 11:31 AM  Additional Follow-up for Phone Call Additional follow up Details #1::        I have spoken to the pt. She is having a flare up of colitis. Please send to her Pharmacy Prednisone 10 mg, # 100, 3 by mouth once daily x t weeks then2 1/2 by mouth dq x 2 weeks. She will stop her Entecort. and continue Pentasa 8/day. Please send Vicodin # 20, 1/2 or 1 tab  by mouth q 6 hrs as needed adb.pain,no refill, OV 3-4 weeks. Additional Follow-up by: Hart Carwin MD,  July 07, 2009 1:06 PM    Additional Follow-up for Phone Call Additional follow up Details #2::    Above MD orders reviewed with patient. Meds to pharmacy. OV scheduled for 07-28-09 at 10:15am. Pt. instructed to call back as needed.  Follow-up by: Laureen Ochs LPN,  July 07, 2009 1:16 PM  New/Updated Medications: PREDNISONE 10 MG  TABS (PREDNISONE) Take 3 tabs (30mg ) by mouth one time daily for 14 days, then decrease to 2 1/2 tabs (25mg )  daily for 14 days. VICODIN 5-500 MG TABS (HYDROCODONE-ACETAMINOPHEN) Take 1/2 to 1 tab by mouth every 6 hours as needed for pain. Prescriptions: VICODIN 5-500 MG TABS (HYDROCODONE-ACETAMINOPHEN) Take 1/2 to 1 tab by mouth every 6 hours as needed for pain.  #20 x 0   Entered by:   Laureen Ochs LPN   Authorized by:   Hart Carwin MD   Signed by:   Laureen Ochs LPN on 09/81/1914   Method used:   Telephoned to ...       [facility] [address]* (retail)       [address] [place], [place]  [address]       Ph: [phone]       Fax: [phone]   RxID:   [id number] PREDNISONE 10 MG  TABS (PREDNISONE) Take 3 tabs (30mg ) by mouth one time daily for 14 days, then decrease to 2 1/2 tabs (25mg ) daily for 14 days.  #100 x 1   Entered by:   Laureen Ochs LPN   Authorized by:   Hart Carwin MD   Signed by:   Laureen Ochs LPN on 40/34/7425   Method used:   Electronically to        [facility] [address] [phone]* (retail)  [address] [place], [place]  [address]       Ph: [phone]       Fax: [phone]   RxID:   [id number]

## 2010-06-29 NOTE — Progress Notes (Signed)
Summary: TRIAGE   Phone Note Call from Patient Call back at Home Phone [phone] Call back at Work Phone [phone]   Call For: Dr Juanda Chance Summary of Call:  Prednisone: was not able to take it so she is back on Enticort.  Initial call taken by: Leanor Kail [facility],  July 20, 2009 12:40 PM  Follow-up for Phone Call        See triage note dated 07-07-09. Pt. has stopped Prednisone, she couldn't handle the side effects--hot flashes and "It made me crazy, I was fussing at everybody at work and my husband"  She restarted Entocort at 9mg  daily on Saturday. Pain has decreased, sees blood in every other BM, has approx. 4 BM's daily, stools are loose. Denies fever or n/v.  Does she need to stay on Entocort or does Dr.Lux Skilton want her to try something else? Follow-up by: Laureen Ochs LPN,  July 20, 2009 3:58 PM  Additional Follow-up for Phone Call Additional follow up Details #1::        I left a message for the pt to call me back, I prefer for her to take prednisone 20 mg once daily rather  than the Entecort which works only in the right colon.Cosider Imuran, Methotrexate, but not possible if she wants to get pregnant Additional Follow-up by: Hart Carwin MD,  July 20, 2009 7:56 PM    Additional Follow-up for Phone Call Additional follow up Details #2::    Above MD orders reviewed with patient. Pt. wants to speak with Dr.Phylliss Strege.  DR.Elnoria Livingston--Please call her at [phone].   Follow-up by: Laureen Ochs LPN,  July 21, 2009 8:26 AM  Additional Follow-up for Phone Call Additional follow up Details #3:: Details for Additional Follow-up Action Taken: I have spoken to Troy. She is back on Entecort and actually feeling better. She will continue 9mg /dayx 4 weeks then call with update.Shr will reschedule from March 2 to end of March

## 2010-06-29 NOTE — Progress Notes (Signed)
Summary: TRIAGE-ABD. PAIN/DIARRHEA   Phone Note Call from Patient Call back at Home Phone [phone]   Call For: Dr Juanda Chance Reason for Call: Talk to Nurse Summary of Call: 2 hrs. ago pt. ate at [employer]. The bread was moldy, she had about 3 bites before she noticed. Now she has diarrhea and severe abd. pain.  Follow-up for Phone Call        Pt. felt fine until she ate part of a moldy sandwich 2 hours ago. Now she c/o watery stools, #3 since lunch, severe mid-abd. pain and nausea. Has taken a Bentyl, some relief from the pain. 1) clear Liquids for 24 hours 2) Continue Bentyl as directed 3) Immodium as needed 4) If symptoms become worse call back immediately or go to ER. 5) I will call pt., if new orders, after MD reviews.   Follow-up by: Laureen Ochs LPN,  March 27, 2008 2:44 PM  Additional Follow-up for Phone Call Additional follow up Details #1::        I agree, no new recomm. Additional Follow-up by: Hart Carwin MD,  March 27, 2008 11:28 PM

## 2010-08-10 LAB — CBC
HCT: 36 % (ref 36.0–46.0)
HCT: 39.3 % (ref 36.0–46.0)
Hemoglobin: 11.7 g/dL — ABNORMAL LOW (ref 12.0–15.0)
Hemoglobin: 12.9 g/dL (ref 12.0–15.0)
MCH: 26.8 pg (ref 26.0–34.0)
MCH: 27.1 pg (ref 26.0–34.0)
MCHC: 32.4 g/dL (ref 30.0–36.0)
MCHC: 32.7 g/dL (ref 30.0–36.0)
MCV: 82.1 fL (ref 78.0–100.0)
MCV: 83.5 fL (ref 78.0–100.0)
Platelets: 222 10*3/uL (ref 150–400)
Platelets: 228 10*3/uL (ref 150–400)
RBC: 4.32 MIL/uL (ref 3.87–5.11)
RBC: 4.79 MIL/uL (ref 3.87–5.11)
RDW: 15.8 % — ABNORMAL HIGH (ref 11.5–15.5)
RDW: 16.3 % — ABNORMAL HIGH (ref 11.5–15.5)
WBC: 10.9 10*3/uL — ABNORMAL HIGH (ref 4.0–10.5)
WBC: 8.4 10*3/uL (ref 4.0–10.5)

## 2010-08-10 LAB — RPR: RPR Ser Ql: NONREACTIVE

## 2010-10-12 NOTE — Assessment & Plan Note (Signed)
[facility]                         GASTROENTEROLOGY OFFICE NOTE   ASHAYLA, SUBIA                        MRN:          [id number]  DATE:01/15/2007                            DOB:          07-Dec-1980    Ms. Killion is a very nice 30 year old white female with history of Crohn's  disease, status post delivery of her first child 5 months ago.  She had  exacerbation of abdominal pain shortly after delivery.  We have  increased her to take Entocort 9 mg daily and Pentasa 1 gram twice a  day.  She was having abdominal post-prandial pain until about a week ago  associated with occasional diarrhea.  The pain subsided about a week  ago.  It has responded to bland diet.  She has been intentionally losing  weight, trying to get to her pre-pregnancy weight which was 125 pounds.  She has managed to lose additional 6 pounds since last visit in June to  currently 132 pounds.  She denies any fever or rectal bleeding.   MEDICATIONS:  1. Pentasa 250 mg four tablets p.o. b.i.d.  2. Entocort 3 mg three tablets p.o. daily.  3. Dicyclomine 10 mg p.o. b.i.d., patient has not taken any for the      past 10 days.   PHYSICAL EXAMINATION:  Blood pressure 116/68, pulse 82, weight 132  pounds.  She was alert, oriented, no distress.  LUNGS:  Clear to auscultation.  COR:  With normal S1, normal S2.  ABDOMEN:  Soft, very tender under the right costal margin at the  gallbladder area.  Left upper quadrant and both lower quadrants were  unremarkable.  There was no distention, no CVA tenderness.   Patient gives family history of gallbladder disease in her sister who  had cholecystectomy at age of 56 and her mother who also had a  cholecystectomy in her 71s.   IMPRESSION:  A 30 year old female with Crohn's disease, now with right  upper quadrant abdominal tenderness suggestive of biliary dysfunction.  She has a family history of gallbladder disease.  Rule out exacerbation  of  Crohn's disease, which actually seems to be doing better now on  Entocort and Pentasa.   PLAN:  1. Low-fat diet.  2. Upper abdominal ultrasound.  3. Decrease Entocort to 6 mg p.o. daily for 4 weeks, then 3 mg p.o.      daily x4 weeks, then discontinue.  4. Continue Pentasa as ordered.  Depending on the results of the      ultrasound we will make final disposition.     Hedwig Morton. Juanda Chance, MD  Electronically Signed    DMB/MedQ  DD: 01/15/2007  DT: 01/15/2007  Job #: [id number]   cc:   Kari Baars, M.D.

## 2010-10-12 NOTE — Assessment & Plan Note (Signed)
[facility]                         GASTROENTEROLOGY OFFICE NOTE   FALON, HUESCA                        MRN:          [id number]  DATE:11/22/2006                            DOB:          [birth year]    Ms. Sophia Beard is a 30 year old white female with Crohn's disease of the  terminal ileum.  She delivered through normal vaginal delivery 15 weeks  ago.  Her pregnancy was unremarkable but within weeks of delivering her  symptoms of Crohn's disease returned, mostly crampy abdominal pain,  diarrhea, and mostly simply bleeding.  She is having about two or three  loose stools a day, one or two a night.  Her weight has decreased but  she is not yet at the prepregnancy weight.  She has been eating small  meals because of urgent diarrhea following meals.  Her prepregnancy  medications included 6-mercaptopurine, Pentasa eight tablets a day, and  Entocort 9 mg daily.  Her medications were discontinued at the beginning  of the pregnancy and she really has not resumed them until 2 weeks ago  when we called her in some Pentasa and she is taking 2 g in two divided  doses a day.  She ran out of her dicyclomine.  She is here today to  followup on her Crohn's disease.   PHYSICAL EXAMINATION:  VITAL SIGNS:  Blood pressure 108/78, pulse 80 and  weight 138 pounds.  GENERAL:  She was alert, oriented, no distress.  LUNGS:  Clear to auscultation.  CORONARY:  Somewhat rapid, S1, S2.  ABDOMEN:  Soft but tender in the right lower quadrant.  There was no  distention, no mass, no rebound.  RECTAL:  Normal rectal tone with no tenderness on digital exam.  Stool  was hemoccult negative.  The patient was on her period.   IMPRESSION:  A 30 year old white female with exacerbation of Crohn's  disease following vaginal delivery.  She has mild symptoms of mostly  crampy abdominal pain and rectal bleeding.   PLAN:  1. Continue Pentasa at 1 g twice a day.  2. Entocort 9 mg daily.  3.  Dicyclomine 10 mg three times a day p.r.n. cramps.  4. CBC, sed rate, and CMET today.   I will see her in 6-8 weeks.  Consider resuming 6-MP.  Consider  increasing her Pentasa to 4 g a day.     Sophia Morton. Juanda Chance, MD  Electronically Signed   DMB/MedQ  DD: 11/22/2006  DT: 11/23/2006  Job #: [id number]   cc:   Kari Baars, M.D.

## 2010-11-01 ENCOUNTER — Ambulatory Visit (INDEPENDENT_AMBULATORY_CARE_PROVIDER_SITE_OTHER): Payer: BC Managed Care – PPO | Admitting: Internal Medicine

## 2010-11-01 ENCOUNTER — Encounter: Payer: Self-pay | Admitting: Internal Medicine

## 2010-11-01 ENCOUNTER — Telehealth: Payer: Self-pay | Admitting: Internal Medicine

## 2010-11-01 ENCOUNTER — Emergency Department (HOSPITAL_COMMUNITY)
Admission: EM | Admit: 2010-11-01 | Discharge: 2010-11-01 | Disposition: A | Discharge status: Home / Self Care | Payer: BC Managed Care – PPO | Attending: Emergency Medicine | Admitting: Emergency Medicine

## 2010-11-01 VITALS — BP 110/74 | HR 72 | Ht 62.0 in | Wt 128.2 lb

## 2010-11-01 DIAGNOSIS — R110 Nausea: Secondary | ICD-10-CM | POA: Insufficient documentation

## 2010-11-01 DIAGNOSIS — R1011 Right upper quadrant pain: Secondary | ICD-10-CM

## 2010-11-01 DIAGNOSIS — K5090 Crohn's disease, unspecified, without complications: Secondary | ICD-10-CM

## 2010-11-01 DIAGNOSIS — R10819 Abdominal tenderness, unspecified site: Secondary | ICD-10-CM | POA: Insufficient documentation

## 2010-11-01 DIAGNOSIS — R1031 Right lower quadrant pain: Secondary | ICD-10-CM | POA: Insufficient documentation

## 2010-11-01 DIAGNOSIS — J45909 Unspecified asthma, uncomplicated: Secondary | ICD-10-CM | POA: Insufficient documentation

## 2010-11-01 LAB — URINALYSIS, ROUTINE W REFLEX MICROSCOPIC
Bilirubin Urine: NEGATIVE
Glucose, UA: NEGATIVE mg/dL
Hgb urine dipstick: NEGATIVE
Ketones, ur: NEGATIVE mg/dL
Nitrite: NEGATIVE
Protein, ur: NEGATIVE mg/dL
Specific Gravity, Urine: 1.004 — ABNORMAL LOW (ref 1.005–1.030)
Urobilinogen, UA: 0.2 mg/dL (ref 0.0–1.0)
pH: 6 (ref 5.0–8.0)

## 2010-11-01 LAB — CBC
HCT: 40.1 % (ref 36.0–46.0)
Hemoglobin: 13.4 g/dL (ref 12.0–15.0)
MCH: 28.2 pg (ref 26.0–34.0)
MCHC: 33.4 g/dL (ref 30.0–36.0)
MCV: 84.2 fL (ref 78.0–100.0)
Platelets: 350 10*3/uL (ref 150–400)
RBC: 4.76 MIL/uL (ref 3.87–5.11)
RDW: 12.4 % (ref 11.5–15.5)
WBC: 6.1 10*3/uL (ref 4.0–10.5)

## 2010-11-01 LAB — DIFFERENTIAL
Basophils Absolute: 0 10*3/uL (ref 0.0–0.1)
Basophils Relative: 1 % (ref 0–1)
Eosinophils Absolute: 0.2 10*3/uL (ref 0.0–0.7)
Eosinophils Relative: 2 % (ref 0–5)
Lymphocytes Relative: 32 % (ref 12–46)
Lymphs Abs: 2 10*3/uL (ref 0.7–4.0)
Monocytes Absolute: 0.4 10*3/uL (ref 0.1–1.0)
Monocytes Relative: 6 % (ref 3–12)
Neutro Abs: 3.6 10*3/uL (ref 1.7–7.7)
Neutrophils Relative %: 59 % (ref 43–77)

## 2010-11-01 LAB — COMPREHENSIVE METABOLIC PANEL
ALT: 20 U/L (ref 0–35)
AST: 20 U/L (ref 0–37)
Albumin: 3 g/dL — ABNORMAL LOW (ref 3.5–5.2)
Alkaline Phosphatase: 95 U/L (ref 39–117)
BUN: 7 mg/dL (ref 6–23)
CO2: 27 mEq/L (ref 19–32)
Calcium: 8.5 mg/dL (ref 8.4–10.5)
Chloride: 104 mEq/L (ref 96–112)
Creatinine, Ser: 0.7 mg/dL (ref 0.4–1.2)
GFR calc Af Amer: >60 mL/min (ref >60)
GFR calc non Af Amer: >60 mL/min (ref >60)
Glucose, Bld: 95 mg/dL (ref 70–99)
Potassium: 3.6 mEq/L (ref 3.5–5.1)
Sodium: 138 mEq/L (ref 135–145)
Total Bilirubin: 0.1 mg/dL — ABNORMAL LOW (ref 0.3–1.2)
Total Protein: 6.5 g/dL (ref 6.0–8.3)

## 2010-11-01 LAB — POCT PREGNANCY, URINE: Preg Test, Ur: NEGATIVE

## 2010-11-01 LAB — LIPASE, BLOOD: Lipase: 29 U/L (ref 11–59)

## 2010-11-01 MED ORDER — CIPROFLOXACIN HCL 250 MG PO TABS: 250.0000 mg | ORAL_TABLET | Freq: Two times a day (BID) | ORAL | Status: AC

## 2010-11-01 NOTE — Telephone Encounter (Signed)
Patient calling to report severe right upper abdominal pain last night. States she had right pain above the belly button below her ribs. The pain lasted for 4-5 hours and patient went to the ER. She thought it was her gallbladder because it did not feel like her Crohns. She states they just ran labs and told her to call Dr. Juanda Chance. Denies bleeding. States she does have some mouth sores but no other problems. Patient scheduled to see Dr. Juanda Chance at 3:00 PM today.

## 2010-11-01 NOTE — Patient Instructions (Addendum)
You have been scheduled for an abdominal ultrasound at [facility] on 11/18/10 at 9:15 am. Please arrive 15 minutes prior to your appointment time for registration. Make certain not to have anything to eat or drink 6 hours prior to your test. Should your abdominal ultrasound be negative, you will have a HIDA scan with CCK. We have sent a prescription to your pharmacy for Cipro 250 mg. You should take 1 tablet by mouth twice daily x 5 days.

## 2010-11-01 NOTE — Progress Notes (Signed)
Sophia Beard 12-Feb-1981 MRN [id number]     History of Present Illness:  This is a 30 year old white female who is status post her second childbirth 7 months ago. She has a history of Crohn's disease diagnosed in 2006. Her last colonoscopy in November 2006 showed active Crohn's disease at the ileal cecal valve. Random biopsies of the normal appearing colon showed granulomas. She has a history of iron deficiency and an allergy to oral Iron. She was seen in the emergency room last night with acute right upper quadrant abdominal pain. An upper abdominal ultrasound for a similar attack in 2008 was normal. She  denies having diarrhea until today when she had 3 loose stools. She took Zantac.  In the emergency room, her sedimentation rate was 39 and her liver function tests were normal. There is a family history of gallbladder disease in her sister and her mother who had a calculus cholecystitis. In the past, her Crohn's disease has been treated with Entocort 9 mg daily and was tapered later.   Past Medical History  Diagnosis Date  . Ovarian cyst   . Anemia   . Asthma   . Depression   . Crohn disease   . Anal fissure    History reviewed. No pertinent past surgical history.  reports that she has never smoked. She does not have any smokeless tobacco history on file. She reports that she does not drink alcohol or use illicit drugs. family history includes Colon cancer in her paternal uncle and Heart disease in her paternal grandfather. Allergies  Allergen Reactions  . Iron Dextran   . Sulfonamide Derivatives         Review of Systems: Admit stool nausea but no vomiting. No dysphagia or odynophagia. 3 loose stools today, negative for rectal bleeding or weight loss  The remainder of the 10  point ROS is negative except as outlined in H&P   Physical Exam: General appearance  Well developed, in no distress. Eyes- non icteric. HEENT nontraumatic, normocephalic. Mouth no lesions, tongue  papillated, no cheilosis. Neck supple without adenopathy, thyroid not enlarged, no carotid bruits, no JVD. Lungs Clear to auscultation bilaterally. Cor normal S1 normal S2, regular rhythm , no murmur,  quiet precordium. Abdomen tender right upper quadrant in right middle quadrant. No rebound. No CVA tenderness. Liver edge at costal margin. Bowel sounds are normal. There is no distention.  Rectal: Stool Hemoccult negative. Extremities no pedal edema. Skin no lesions. Neurological alert and oriented x 3. Psychological normal mood and affect.  Assessment and Plan:  Problem #1 Acute right upper quadrant abdominal pain of unclear etiology. This could be a biliary attack or flareup of Crohn's disease. Biliary colic is more likely because of the location and the sudden onset of it. She has a family history of gallbladder disease. The tenderness is mostly in the right upper quadrant. We will obtain an upper abdominal ultrasound and HIDA scan with CCK if necessary. She will start a low-fat diet.  Problem #2 Crohn's disease at the ileo- cecal valve, as of last colonoscopy 2006. We will complete biliary studies first. If they are negative, we will consider restarting Entocort. We will start her on Cipro 250 mg by mouth twice a day for 5 days.   11/01/2010 [name]

## 2010-11-18 ENCOUNTER — Encounter (HOSPITAL_COMMUNITY): Payer: Self-pay

## 2010-11-18 ENCOUNTER — Encounter (HOSPITAL_COMMUNITY)
Admission: RE | Admit: 2010-11-18 | Discharge: 2010-11-18 | Disposition: A | Discharge status: Home / Self Care | Payer: BC Managed Care – PPO | Source: Ambulatory Visit | Attending: Internal Medicine | Admitting: Internal Medicine

## 2010-11-18 ENCOUNTER — Telehealth: Payer: Self-pay | Admitting: *Deleted

## 2010-11-18 DIAGNOSIS — R110 Nausea: Secondary | ICD-10-CM | POA: Insufficient documentation

## 2010-11-18 DIAGNOSIS — K5090 Crohn's disease, unspecified, without complications: Secondary | ICD-10-CM | POA: Insufficient documentation

## 2010-11-18 DIAGNOSIS — R1011 Right upper quadrant pain: Secondary | ICD-10-CM

## 2010-11-18 MED ORDER — TECHNETIUM TC 99M MEBROFENIN IV KIT
5.3000 | PACK | Freq: Once | INTRAVENOUS | Status: AC | PRN
  Administered 2010-11-18: 5.3 via INTRAVENOUS

## 2010-11-18 MED ORDER — SINCALIDE 5 MCG IJ SOLR: 1.1000 ug | Freq: Once | INTRAMUSCULAR | Status: DC

## 2010-11-18 NOTE — Telephone Encounter (Signed)
Message copied by Daphine Deutscher on Thu Nov 18, 2010  3:05 PM ------      Message from: Hart Carwin      Created: Thu Nov 18, 2010  2:34 PM       Please call pt with normal abd. Ultrasound of the liver and gall bladder, the HIDA scan is lower limits of norma EF 37% ( normal> 30% ). I don't think it was ger gall bladder, hope she is doing better.

## 2010-11-18 NOTE — Telephone Encounter (Signed)
Patient given results as per Dr. Juanda Chance. States she is doing much better.

## 2010-11-18 NOTE — Telephone Encounter (Signed)
Left a message for patient to call me. 

## 2010-11-25 ENCOUNTER — Telehealth: Payer: Self-pay | Admitting: *Deleted

## 2010-11-25 NOTE — Telephone Encounter (Signed)
Message copied by Richardson Chiquito on Thu Nov 25, 2010  1:42 PM ------      Message from: Hart Carwin      Created: Thu Nov 25, 2010 12:44 PM      Regarding: keep appoingtment       She ought to keep her appointment      ----- Message -----         From: Vernia Buff, CMA         Sent: 11/25/2010  10:51 AM           To: Hart Carwin, MD            Dr Juanda ChanceMorrie Sheldon is on your schedule for 12/02/10 for abdominal pain. I called to ask her if this was an old appointment (as she scheduled the visit on 11/01/10 but we saw her in the office that day). Patient states that it was an old appointment but she thought Dr Juanda Chance may want to see her again so she didn't know if she should cancel or not. She states that she is doing somewhat better although she is still not well. She has started her steroids again and wanted you to be aware of that. Do you want to see her in the office again or is she okay to cancel?

## 2010-12-02 ENCOUNTER — Encounter: Payer: Self-pay | Admitting: Internal Medicine

## 2010-12-02 ENCOUNTER — Ambulatory Visit (INDEPENDENT_AMBULATORY_CARE_PROVIDER_SITE_OTHER): Payer: BC Managed Care – PPO | Admitting: Internal Medicine

## 2010-12-02 VITALS — BP 98/66 | HR 80 | Ht 62.0 in | Wt 127.0 lb

## 2010-12-02 DIAGNOSIS — R1031 Right lower quadrant pain: Secondary | ICD-10-CM

## 2010-12-02 DIAGNOSIS — K5000 Crohn's disease of small intestine without complications: Secondary | ICD-10-CM

## 2010-12-02 DIAGNOSIS — K5090 Crohn's disease, unspecified, without complications: Secondary | ICD-10-CM

## 2010-12-02 MED ORDER — DICYCLOMINE HCL 10 MG PO CAPS: 10.0000 mg | ORAL_CAPSULE | Freq: Three times a day (TID) | ORAL | Status: DC | PRN

## 2010-12-02 NOTE — Patient Instructions (Signed)
We have sent the following medications to your pharmacy for you to pick up at your convenience: Bentyl 10 mg. Take 1 tablet by mouth three times daily as needed. Follow up with Dr Juanda Chance in 4-6 weeks. Dr Buren Kos

## 2010-12-02 NOTE — Progress Notes (Signed)
Sophia Beard 1980-07-15 MRN [id number]    History of Present Illness:  This is a 30 year old white female 7 months post delivery of her second child. She has a flareup of Crohn's disease. The initial diagnosis was made in 2006. Her last colonoscopy in November 2006 showed active Crohn's colitis at the ileocecal valve. Random biopsies of the normal-appearing colon mucosa showed granulomas. She has been having right upper and right lower quadrant abdominal pain which is only slightly improved with Entocort 9 mg daily x 4 weeks. She has a history of iron deficiency anemia and an allergy to oral iron. An upper abdominal ultrasound for evaluation of right upper quadrant pain in June 2012 was normal. A HIDA scan showed a normal ejection fraction at 37% which is at the lower limits of normal. Patient experienced pain with injection of CCK. On ultrasound, her gallbladder wall was 1.9 mm and the common bile duct was 5 mm. She does have a 10 cm spleen. She denies having diarrhea or rectal bleeding. She has had increased gastroesophageal reflux.   Past Medical History  Diagnosis Date  . Ovarian cyst   . Anemia   . Asthma   . Depression   . Crohn disease   . Anal fissure    No past surgical history on file.  reports that she has never smoked. She does not have any smokeless tobacco history on file. She reports that she does not drink alcohol or use illicit drugs. family history includes Colon cancer in her paternal uncle and Heart disease in her paternal grandfather. Allergies  Allergen Reactions  . Iron Dextran   . Sulfonamide Derivatives         Review of Systems: Positive for acid reflux. Positive for abdominal pain and occasional diarrhea. Negative for rectal bleeding  The remainder of the 10  point ROS is negative except as outlined in H&P   Physical Exam: General appearance  Well developed, in no distress. Eyes- non icteric. HEENT nontraumatic, normocephalic. Mouth no lesions,  tongue papillated, no cheilosis. Neck supple without adenopathy, thyroid not enlarged, no carotid bruits, no JVD. Lungs Clear to auscultation bilaterally. Cor normal S1 normal S2, regular rhythm , no murmur,  quiet precordium. Abdomen soft, very tender in right upper middle and lower quadrant. No rebound or mass. The rest of the abdomen was normal. Rectal: Not done. Extremities no pedal edema. Skin no lesions. Neurological alert and oriented x 3. Psychological normal mood and affect.  Assessment and Plan:  Problem #1 Ongoing abdominal pain which has somewhat improved on Entocort, most likely related to active Crohn's disease at the ileocecal valve. A HIDA scan shows a normal ejection fraction of 37% which is at the lower limits of normal. There is a family history of gallbladder disease. We may consider a surgical consultation. However, I would first treat her for an additional 4 weeks with Entocort 9 mg daily and if symptoms continue, proceed with CT scan of the abdomen to visualize the terminal ileum and rule out partial small bowel obstruction. I would also consider switching to systemic steroids. She is reluctant to use prednisone due to side effects. We may also need to consider repeating a colonoscopy to assess for activity of the Crohn's disease. I will see her in 4-6 weeks.   12/02/2010 [name]

## 2011-01-03 ENCOUNTER — Encounter: Payer: Self-pay | Admitting: Internal Medicine

## 2011-01-03 ENCOUNTER — Ambulatory Visit (INDEPENDENT_AMBULATORY_CARE_PROVIDER_SITE_OTHER)
Admission: RE | Admit: 2011-01-03 | Discharge: 2011-01-03 | Disposition: A | Discharge status: Home / Self Care | Payer: BC Managed Care – PPO | Source: Ambulatory Visit | Attending: Internal Medicine | Admitting: Internal Medicine

## 2011-01-03 ENCOUNTER — Ambulatory Visit (INDEPENDENT_AMBULATORY_CARE_PROVIDER_SITE_OTHER): Payer: BC Managed Care – PPO | Admitting: Internal Medicine

## 2011-01-03 VITALS — BP 110/76 | HR 88 | Ht 62.0 in | Wt 126.0 lb

## 2011-01-03 DIAGNOSIS — K5000 Crohn's disease of small intestine without complications: Secondary | ICD-10-CM

## 2011-01-03 DIAGNOSIS — R197 Diarrhea, unspecified: Secondary | ICD-10-CM

## 2011-01-03 DIAGNOSIS — K5010 Crohn's disease of large intestine without complications: Secondary | ICD-10-CM

## 2011-01-03 MED ORDER — IOHEXOL 300 MG/ML  SOLN
80.0000 mL | Freq: Once | INTRAMUSCULAR | Status: AC | PRN
  Administered 2011-01-03: 80 mL via INTRAVENOUS

## 2011-01-03 NOTE — Patient Instructions (Addendum)
You have been scheduled for a CT scan of the abdomen and pelvis at [facility] ([address]---this is in the same building as [facility]).   You are scheduled on  01/03/11 at 3:30 pm. You should arrive 15 minutes prior to your appointment time for registration. Please follow the written instructions below on the day of your exam:  1) Do not eat or drink anything after 11;30 am (4 hours prior to your test) 2) You have been given 2 bottles of oral contrast to drink. The solution may taste better if refrigerated, but do NOT add ice or any other liquid to this solution. Shake             well before drinking.  Drink 1 bottle of contrast @ 1:30 pm (2 hours prior to your exam)  Drink 1 bottle of contrast @ 2:30 pm (1 hour prior to your exam)  You may take any medications as prescribed with a small amount of water except for the following: Metformin, Glucophage, Glucovance, Avandamet, Riomet, Fortamet, Actoplus Met, Janumet, Glumetza or Metaglip. The above medications must be held the day of the exam and 48 hours after the exam.  The purpose of you drinking the oral contrast is to aid in the visualization of your intestinal tract. The contrast solution may cause some diarrhea. Before your exam is started, you will be given a small amount of fluid to drink. Depending on your individual set of symptoms, you may also receive an intravenous injection of x-ray contrast/dye.  If you have any questions regarding your exam or if you need to reschedule, you may call the CT department at [phone] between the hours of 8:00 am and 5:00 pm, Monday-Friday.  ________________________________________________________________________ We have sent the following medications to your pharmacy for you to pick up at your convenience: Asacol H. Take 2 tablets by mouth twice daily. Prednisone 10 mg. Take 3 tablets by mouth once daily. Please follow up with Dr Juanda Chance in 4 weeks.   Dr Shelda Altes

## 2011-01-03 NOTE — Progress Notes (Signed)
Sophia Beard 07-27-1980 MRN [id number]     History of Present Illness:  This is a 30 year old white female with Crohn's disease of the terminal ileum and colon. She had an exacerbation after the delivery of her second child in January 2012. The flareup started about 2 months after the delivery. Her last colonoscopy in November 2006 showed active Crohn's disease of the ileocecal valve. Random biopsies of the normal appearing colon showed granulomas. She has had right lower quadrant abdominal pain and also has had upper abdominal pain. She has not responded to Entocort 9 mg daily for the past 4 weeks. She has iron deficiency anemia and intolerance to oral iron. An upper abdominal ultrasound in June 2012 showed a normal common bile duct and normal 10 cm spleen. A HIDA scan showed 37% ejection fraction. She had pain with infusion of CCK. Her mother and sister had gallbladder disease. She describes diarrhea and abdominal pain within an hour of eating.   Past Medical History  Diagnosis Date  . Ovarian cyst   . Anemia   . Asthma   . Depression   . Crohn disease   . Anal fissure    No past surgical history on file.  reports that she has never smoked. She has never used smokeless tobacco. She reports that she does not drink alcohol or use illicit drugs. family history includes Colon cancer in her paternal uncle and Heart disease in her paternal grandfather. Allergies  Allergen Reactions  . Iron Dextran   . Sulfonamide Derivatives         Review of Systems: Denies acid reflux. Shortness of breath or chest pain. Weight has been stable  The remainder of the 10  point ROS is negative except as outlined in H&P   Physical Exam: General appearance  Well developed, in no distress. Eyes- non icteric. HEENT nontraumatic, normocephalic. Mouth no lesions, tongue papillated, no cheilosis. Neck supple without adenopathy, thyroid not enlarged, no carotid bruits, no JVD. Lungs Clear to auscultation  bilaterally. Cor normal S1 normal S2, regular rhythm , no murmur,  quiet precordium. Abdomen diffusely tender abdomen mostly in the epigastrium and left upper quadrant and across the transverse colon. Mild tenderness in right lower quadrant. No tenderness in left lower quadrant. Rectal: Not done. Extremities no pedal edema. Skin no lesions. Neurological alert and oriented x 3. Psychological normal mood and affect.  Assessment and Plan:  Problem #1 Crohn's disease of the ileocecal valve and colon not responsive to Entocort. We will start prednisone 30 mg daily and Asacol HD 1600 mg twice a day. She will stay on a low-residue diet. We will obtain a CT scan of the abdomen and pelvis; she will also continue on dicyclomine. I will see her in 4 weeks. She will calll [place] in one week with an update. We have discussed the possibility of biologicals, or immunomodulators. We have discussed possibly doing a colonoscopy as well but opted for a CT scan first.   01/03/2011 [name]

## 2011-01-04 ENCOUNTER — Telehealth: Payer: Self-pay | Admitting: *Deleted

## 2011-01-04 NOTE — Telephone Encounter (Signed)
Message copied by Daphine Deutscher on Tue Jan 04, 2011  9:36 AM ------      Message from: Hart Carwin      Created: Mon Jan 03, 2011 10:39 PM       Please call pt with results which show active Crohn's disease in TI, she needs to take her Prednisone as we decided today.  Let her call us with update in 2 weeks.

## 2011-01-04 NOTE — Telephone Encounter (Signed)
Patient given results and recommendations. She will call with an update in 2 weeks

## 2011-01-04 NOTE — Telephone Encounter (Signed)
LMTCB

## 2011-01-17 ENCOUNTER — Telehealth: Payer: Self-pay | Admitting: Internal Medicine

## 2011-01-17 MED ORDER — MESALAMINE 800 MG PO TBEC: DELAYED_RELEASE_TABLET | ORAL | Status: DC

## 2011-01-17 NOTE — Telephone Encounter (Signed)
rx sent to pharmacy. I have left a message for patient and have apologized for the inconvenience.

## 2011-02-04 ENCOUNTER — Ambulatory Visit: Payer: BC Managed Care – PPO | Admitting: Internal Medicine

## 2012-03-06 IMAGING — US US ABDOMEN COMPLETE
1 series · 13 of 25 positions shown · non-contrast
Comparison: 01/19/2007 study.

CLINICAL DATA: History of abdominal pain.  History of Crohn's
disease.  History of nausea.

ABDOMINAL ULTRASOUND COMPLETE

[Series 1: us abdomen complete · 0.30mm/px · 13 of 87 slices shown]
[im 1/87]
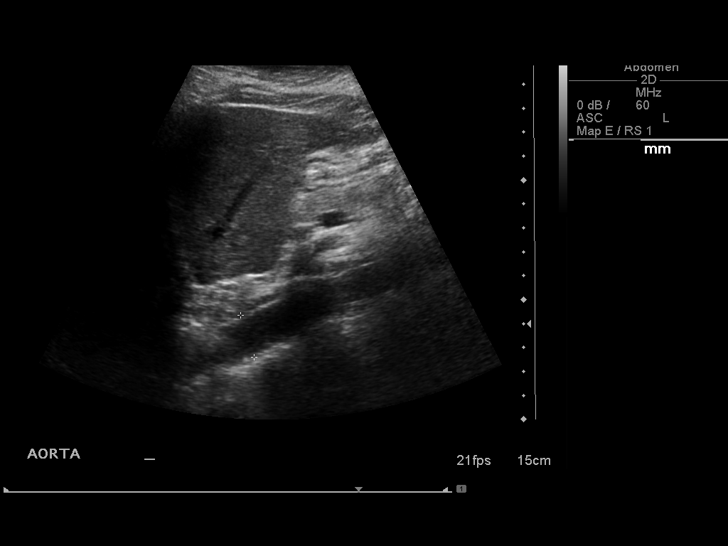
[im 8/87]
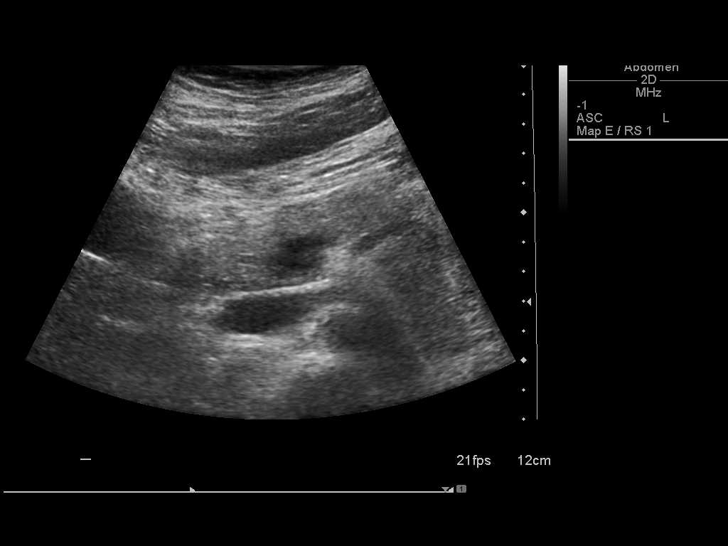
[im 15/87]
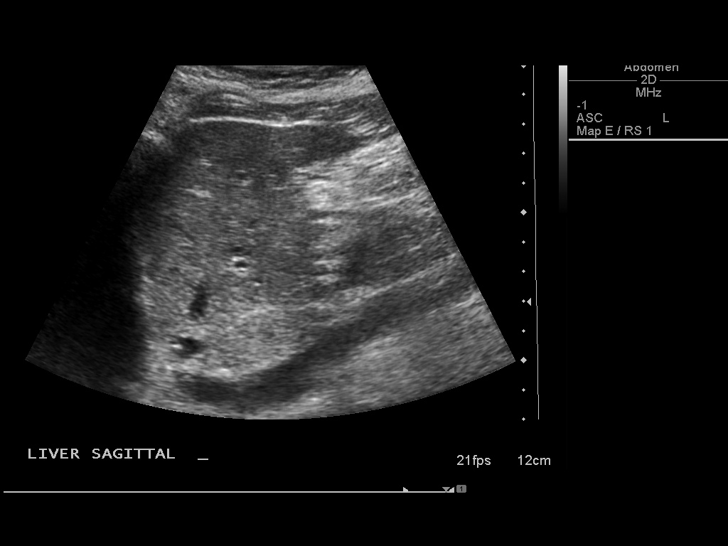
[im 22/87]
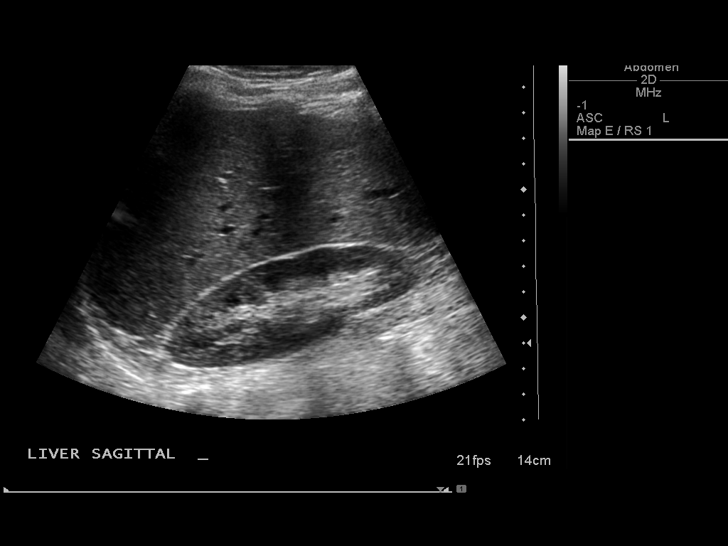
[im 29/87]
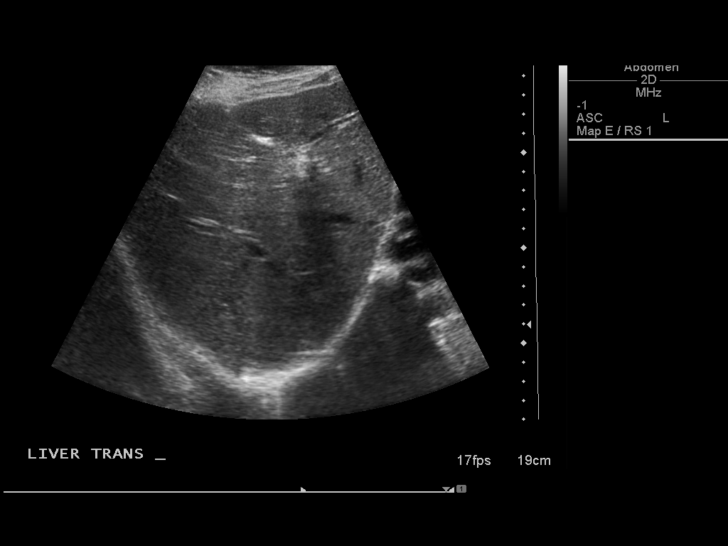
[im 36/87]
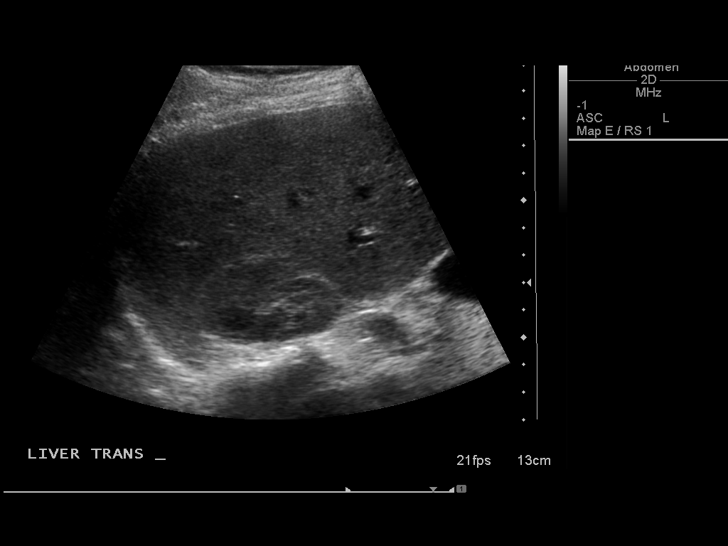
[im 44/87]
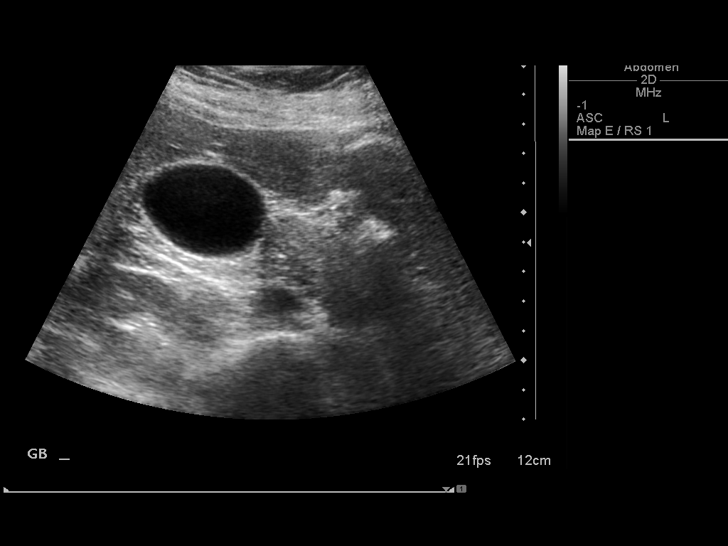
[im 51/87]
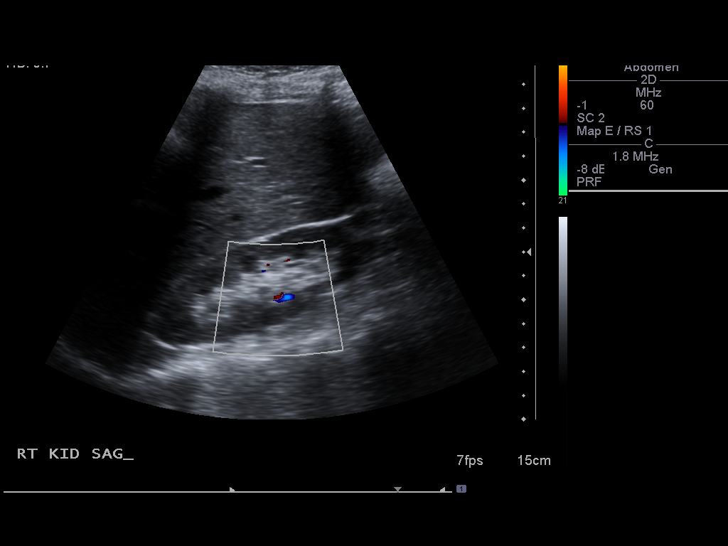
[im 58/87]
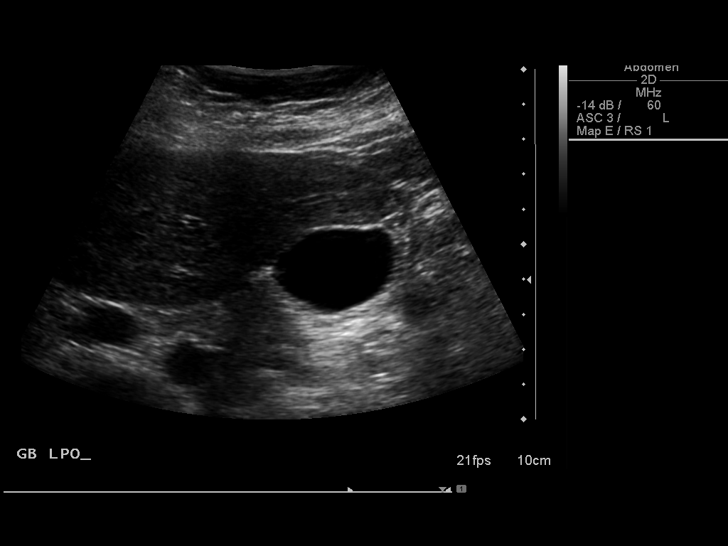
[im 65/87]
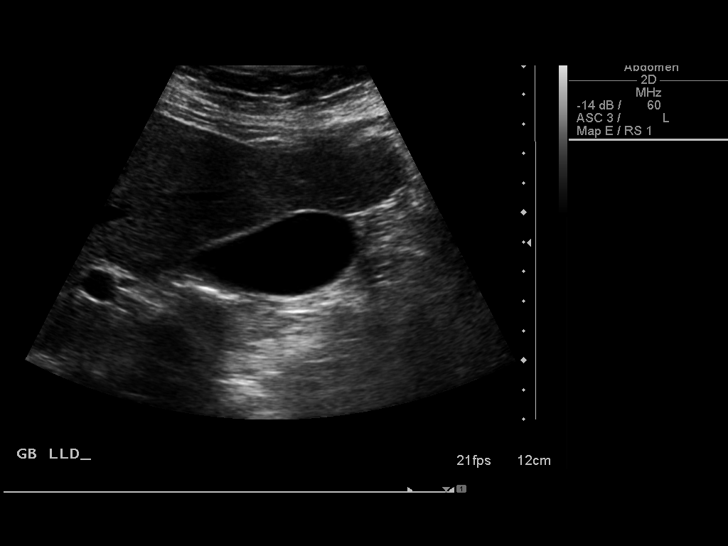
[im 72/87]
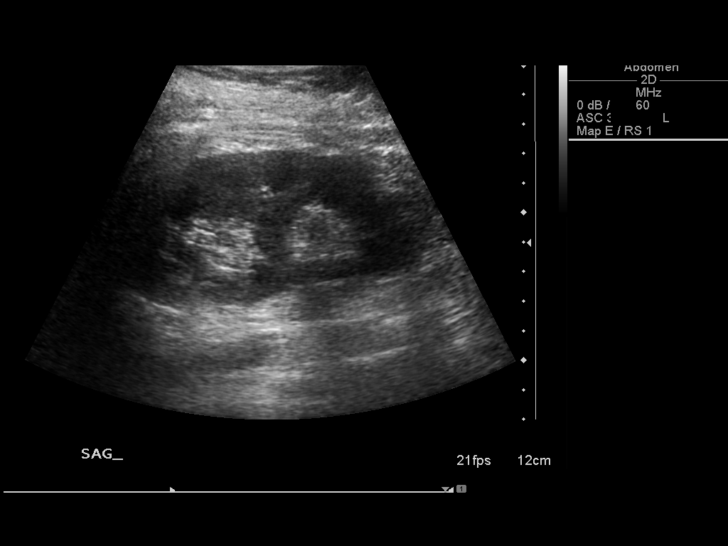
[im 79/87]
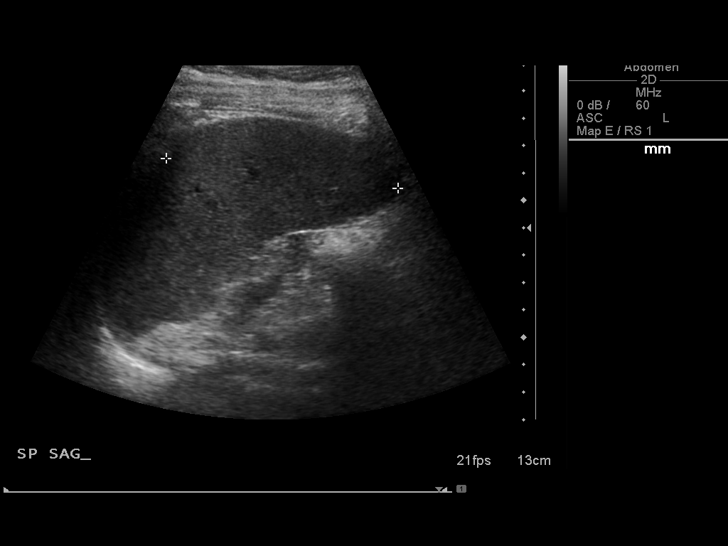
[im 87/87]
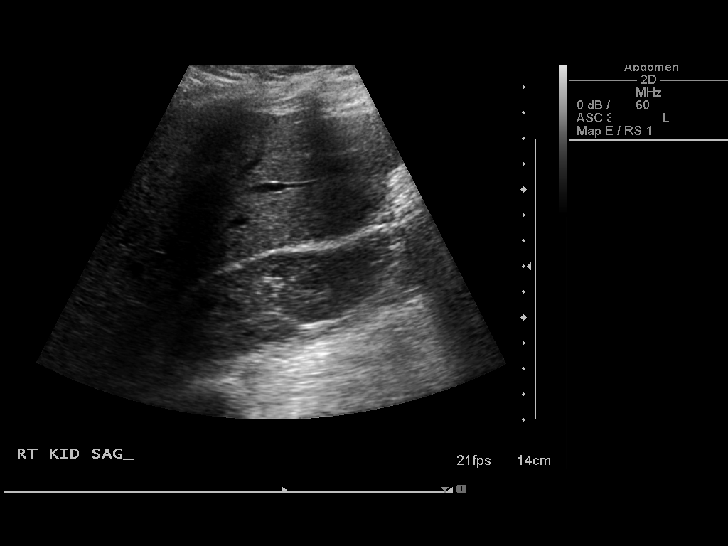

[13 of 25 positions shown; findings below may reference images not displayed]

FINDINGS: Gallbladder: No shadowing gallstones or echogenic sludge. No
gallbladder wall thickening or pericholecystic fluid. The
gallbladder wall thickness measured 1.9 mm. No sonographic Murphy's
sign according to the ultrasound technologist.

CBD: Normal in caliber measuring 5 mm. No choledocholithiasis is
evident.

Liver:  Normal size and echotexture without focal parenchymal
abnormality. Hepatic veins appear patent.  Portal vein is patent.
There is normal hepatopetal flow in the portal vein by color
Doppler imaging.

IVC:  Patent throughout its visualized course in the abdomen.

Pancreas:  Although the pancreas is difficult to visualize in its
entirety, no focal pancreatic abnormality is identified.

Spleen:  Normal size and echotexture without focal abnormality.
Length is 10 cm.

Right kidney:  No hydronephrosis.  Well-preserved cortex.  Normal
parenchymal echotexture without focal abnormalities.  Right renal
length is 10.5 cm.

Left kidney:  No hydronephrosis.  Well-preserved cortex.  Normal
parenchymal echotexture without focal abnormalities.  Left renal
length is 10.2 cm.

Aorta:  Maximum diameter is 1.8 cm.  No aneurysm is evident.

Ascites:  None.
IMPRESSION: No abdominal pathology was demonstrated.

## 2013-05-30 HISTORY — PX: ILEOCECETOMY: SHX5857

## 2014-01-20 ENCOUNTER — Other Ambulatory Visit (INDEPENDENT_AMBULATORY_CARE_PROVIDER_SITE_OTHER): Payer: BC Managed Care – PPO

## 2014-01-20 ENCOUNTER — Encounter: Payer: Self-pay | Admitting: Physician Assistant

## 2014-01-20 ENCOUNTER — Telehealth: Payer: Self-pay | Admitting: Internal Medicine

## 2014-01-20 ENCOUNTER — Ambulatory Visit (INDEPENDENT_AMBULATORY_CARE_PROVIDER_SITE_OTHER): Payer: BC Managed Care – PPO | Admitting: Physician Assistant

## 2014-01-20 VITALS — BP 100/60 | HR 62 | Ht 62.0 in | Wt 118.8 lb

## 2014-01-20 DIAGNOSIS — R197 Diarrhea, unspecified: Secondary | ICD-10-CM

## 2014-01-20 DIAGNOSIS — K5000 Crohn's disease of small intestine without complications: Secondary | ICD-10-CM

## 2014-01-20 DIAGNOSIS — Z8719 Personal history of other diseases of the digestive system: Secondary | ICD-10-CM

## 2014-01-20 LAB — CBC WITH DIFFERENTIAL/PLATELET
Basophils Absolute: 0.1 10*3/uL (ref 0.0–0.1)
Basophils Relative: 0.5 % (ref 0.0–3.0)
Eosinophils Absolute: 0.1 10*3/uL (ref 0.0–0.7)
Eosinophils Relative: 0.8 % (ref 0.0–5.0)
HCT: 38.9 % (ref 36.0–46.0)
Hemoglobin: 12.6 g/dL (ref 12.0–15.0)
Lymphocytes Relative: 20.6 % (ref 12.0–46.0)
Lymphs Abs: 2.1 10*3/uL (ref 0.7–4.0)
MCHC: 32.3 g/dL (ref 30.0–36.0)
MCV: 73.7 fl — ABNORMAL LOW (ref 78.0–100.0)
Monocytes Absolute: 0.6 10*3/uL (ref 0.1–1.0)
Monocytes Relative: 5.6 % (ref 3.0–12.0)
Neutro Abs: 7.5 10*3/uL (ref 1.4–7.7)
Neutrophils Relative %: 72.5 % (ref 43.0–77.0)
Platelets: 567 10*3/uL — ABNORMAL HIGH (ref 150.0–400.0)
RBC: 5.28 Mil/uL — ABNORMAL HIGH (ref 3.87–5.11)
RDW: 15 % (ref 11.5–15.5)
WBC: 10.3 10*3/uL (ref 4.0–10.5)

## 2014-01-20 LAB — HIGH SENSITIVITY CRP: CRP, High Sensitivity: 15.67 mg/L — ABNORMAL HIGH (ref 0.000–5.000)

## 2014-01-20 MED ORDER — BUDESONIDE 3 MG PO CP24: 3.0000 mg | ORAL_CAPSULE | Freq: Every day | ORAL | Status: DC

## 2014-01-20 MED ORDER — MESALAMINE ER 500 MG PO CPCR: ORAL_CAPSULE | ORAL | Status: DC

## 2014-01-20 NOTE — Progress Notes (Signed)
Reviewed and agree with Pentasa, Ente cort, Labs, then follow up.

## 2014-01-20 NOTE — Patient Instructions (Signed)
Please go to the basement level to have your labs drawn.   We sent prescriptions [facility], [place]. 1. Entorcort 3 mg 2. Pentesa 500 mg  We made you a follow up appointment with Dr. Juanda Chance on 02-25-14 at 1:45 PM .

## 2014-01-20 NOTE — Progress Notes (Signed)
Subjective:    Patient ID: Sophia Beard, female    DOB: February 17, 1981, 33 y.o.   MRN: [id number]  HPI Sophia Beard is a pleasant 33 year old white female known to Dr. [name] with history of Crohn's ileocolitis on colonoscopy in 2006. She had imaging done with CT scan of the abdomen and pelvis in 2012 when she had an exacerbation and at that time had no evidence of colonic inflammation but did have active ileitis. She has been treated with Entocort in the past for flares and says this has been helpful however with her last flare in 2012 she apparently did not respond well to Entocort and and eventually was given a course of prednisone. We have not seen her over the past couple of years and she says she had been doing very well until about a month ago when she started having intermittent abdominal cramping and stomachaches. She says her symptoms have been gradually worsening over the past couple of weeks. She's also been having bad menstrual cramps and says she has been having menstrual period of over the past 2 weeks which is quite abnormal for her. She says she has been having lower abdominal cramping after eating, nausea without vomiting ,decrease in her appetite and weight loss of about 2 pounds since onset of symptoms. No vomiting, no fever, or chills. She is having 2-3 loose bowel movements per day with air and intermittent red blood noted in her stools. She says all that her symptoms are consistent with her Crohn's exacerbations except for the prolonged menses. She's been taking a lot of Aleve and Midol to help menstrual cramps and wonders if that's making her stomach pain worse. She has not stayed on any maintenance medication not had done well with Pentasa in the past.    Review of Systems  Constitutional: Positive for fatigue and unexpected weight change.  HENT: Negative.   Eyes: Negative.   Respiratory: Negative.   Cardiovascular: Negative.   Gastrointestinal: Positive for nausea, abdominal  pain, diarrhea and blood in stool.  Endocrine: Negative.   Musculoskeletal: Negative.   Skin: Negative.   Allergic/Immunologic: Negative.   Neurological: Negative.   Hematological: Negative.   Psychiatric/Behavioral: Negative.    Outpatient Prescriptions Prior to Visit  Medication Sig Dispense Refill  . dicyclomine (BENTYL) 10 MG capsule Take 1 capsule (10 mg total) by mouth 3 (three) times daily as needed.  90 capsule  2  . Prenatal Vit-Fe Fumarate-FA (PRENATAL PLUS/IRON) 27-1 MG TABS Take one tablet by mouth couple times a week.      . budesonide (ENTOCORT EC) 3 MG 24 hr capsule Take 9 mg by mouth every morning.       . Mesalamine (ASACOL HD) 800 MG TBEC Take 2 tablets by mouth twice daily.  120 tablet  3  . ranitidine (ZANTAC) 150 MG tablet Take 150 mg by mouth as needed.         No facility-administered medications prior to visit.   Allergies  Allergen Reactions  . Iron Dextran   . Sulfonamide Derivatives    Patient Active Problem List   Diagnosis Date Noted  . ANAL FISSURE 06/22/2009  . DIARRHEA 12/17/2007  . ANEMIA 09/03/2007  . DEPRESSION 09/03/2007  . ASTHMA 09/03/2007  . OVARIAN CYST 09/03/2007  . CROHN'S DISEASE, HX OF 09/03/2007   History  Substance Use Topics  . Smoking status: Never Smoker   . Smokeless tobacco: Never Used  . Alcohol Use: No  family history includes Colon cancer in  her paternal uncle; Heart disease in her paternal grandfather; Irritable bowel syndrome in her sister.     Objective:   Physical Exam  well-developed young white female in no acute distress, pleasant blood pressure 100/60 pulse 62 height 5 foot 2 weight 118. HEENT; nontraumatic normocephalic EOMI PERRLA sclera anicteric, Supple; no JVD, Cardiovascular; regular rate rhythm with S1-S2 no murmur or gallop, Pulm; clear bilaterally, Abdomen ;is soft mildly tender bilaterally in the lower quadrants right greater than left no guarding or rebound no palpable mass or hepatosplenomegaly bowel  sounds are present, Rectal;exam not done, Ext;no clubbing cyanosis or edema, she does have a reddish purplish hue to  her toes when dependent and has history of Raynaud's, Neuro/ psych; mood and affect normal and appropriate        Assessment & Plan:  #11  33 year old female with known history of Crohn's disease with active ileitis at the time of last imaging in 2012 now presenting with 2-3 week history of abdominal pain cramping looser stools intermittent hematochezia and nausea all consistent with exacerbation of Crohn's disease #2 menorrhagia- etiology not clear-patient to followup with GYN  Plan; Check CBC with differential and CRP Start Entocort 9 mg by mouth every morning Start Pentasa 500 mg 2 tablets 3 times daily Bentyl 10 mg every 6 hours as needed for cramping Patient is asked to stop Aleve and Midol and use Tylenol as needed for pain Patient is advised to make an appointment with her gynecologist She is also advised that if she's not seen any improvement in her symptoms in the next one to 2 weeks- to call back and we can put her on prednisone if necessary. Otherwise she will continue Entocort and followup with Dr. Juanda Chance in 4-6 weeks

## 2014-01-20 NOTE — Telephone Encounter (Signed)
Spoke with patient and she states she is having problems with stomach pain and cramping. She is unable to eat without pain. States she is only eating bland, soft foods. States she thinks it is a Crohn's flare. She is not on any medications currently and has not been seen in several years. She also states she has been talking to her GYN due to a 2 week long period. Scheduled with Mike Gip, PA today at 2:30 PM.

## 2014-02-25 ENCOUNTER — Encounter: Payer: Self-pay | Admitting: Internal Medicine

## 2014-02-25 ENCOUNTER — Ambulatory Visit (INDEPENDENT_AMBULATORY_CARE_PROVIDER_SITE_OTHER): Payer: BC Managed Care – PPO | Admitting: Internal Medicine

## 2014-02-25 VITALS — BP 102/68 | HR 84 | Ht 62.0 in | Wt 118.0 lb

## 2014-02-25 DIAGNOSIS — K5000 Crohn's disease of small intestine without complications: Secondary | ICD-10-CM

## 2014-02-25 MED ORDER — OMEPRAZOLE 20 MG PO CPDR
20.0000 mg | DELAYED_RELEASE_CAPSULE | Freq: Every day | ORAL | Status: DC
Start: 2014-02-25 — End: 2014-03-21

## 2014-02-25 MED ORDER — PREDNISONE 10 MG PO TABS: ORAL_TABLET | ORAL | Status: DC

## 2014-02-25 NOTE — Progress Notes (Signed)
Sophia Beard February 11, 1981 [id number]  Note: This dictation was prepared with Dragon digital system. Any transcriptional errors that result from this procedure are unintentional.   History of Present Illness:  This is a 33 year old white female with Crohn's disease of the terminal ileum since 2006. It has been localized to the  last 13 cm of the distal ileum. Her last CT scan of the abdomen in 2012 showed ileitis. A prior CT scan in 2006 again showed terminal ileum involvement. She saw Sophia Gip, PA-C in August 2015 and was started on Entocort 9 mg daily as well as Pentasa 500 mg 2-3 times a day. She has not improved significantly. She is having diffuse abdominal pain which starts shortly after she starts to eat. There has been no vomiting but she is having increased acid reflux. Her CRP is elevated at 15.6. She in the past responded to Entocort as well as prednisone but is reluctant to use high dose prednisone over extended time because of its side effects. She has lost several pounds since her last appointment.    Past Medical History  Diagnosis Date  . Ovarian cyst   . Anemia   . Asthma   . Depression   . Crohn disease   . Anal fissure   . Anxiety   . GI bleed   . Raynaud's disease     Past Surgical History  Procedure Laterality Date  . Neg hx      Allergies  Allergen Reactions  . Iron Dextran   . Sulfonamide Derivatives     Family history and social history have been reviewed.  Review of Systems: Denies nausea vomiting  The remainder of the 10 point ROS is negative except as outlined in the H&P  Physical Exam: General Appearance Well developed, in no distress Eyes  Non icteric  HEENT  Non traumatic, normocephalic  Mouth No lesion, tongue papillated, no cheilosis Neck Supple without adenopathy, thyroid not enlarged, no carotid bruits, no JVD Lungs Clear to auscultation bilaterally COR Normal S1, normal S2, regular rhythm, no murmur, quiet precordium Abdomen  diffusely tender in all quadrants mostly around the umbilicus. Normal active bowel sounds. No distention Rectal soft trace Hemoccult-positive stool Extremities  No pedal edema Skin No lesions Neurological Alert and oriented x 3 Psychological Normal mood and affect  Assessment and Plan:   Problem #58 33 year old white female with Crohn's disease of the small bowel, not responding to Entocort. She is reluctant to take long-term steroids. We will proceed with a CT scan of the abdomen and pelvis to assess the activity and extent of the disease. Some of the symptoms may be due to partial obstruction. She will start Prilosec 20 mg daily and a low-residue diet. We will add prednisone 30 mg for 2 days, gradually decreased by 5 mg every 2 days. We have discussed extensively the use of biologicals which may have to be started in place of systemic steroids. I discussed the various biologicals, the side effects, the administration and the expense. I will await results of the CT scan before making a final decision about using biologicals.    [name] 02/25/2014

## 2014-02-25 NOTE — Patient Instructions (Signed)
We have sent the following medications to your pharmacy for you to pick up at your convenience: Prilosec 20 mg daily. Prednisone  Please continue Entocort.  Dr Olevia Perches has advised that you be on a prednisone taper. The taper instructions are as follows: 30 mg (3 tablets) x 2 days 20 mg (2 tablets) x 2 days 15 mg (1.5 tablets) x 2 days 10 mg (1 tablet) x 2 days 5 mg (1/2 tablet) x 2 days Discontinue  You have been scheduled for a CT scan of the abdomen and pelvis at [facility] ([address]---this is in the same building as Press [facility]).   You are scheduled on Thursday 02/27/14 at 2:00 pm. You should arrive 15 minutes prior to your appointment time for registration. Please follow the written instructions below on the day of your exam:  WARNING: IF YOU ARE ALLERGIC TO IODINE/X-RAY DYE, PLEASE NOTIFY RADIOLOGY IMMEDIATELY AT [phone]! YOU WILL BE GIVEN A 13 HOUR PREMEDICATION PREP.  1) Do not eat or drink anything after 10:00 am (4 hours prior to your test) 2) You have been given 2 bottles of oral contrast to drink. The solution may taste better if refrigerated, but do NOT add ice or any other liquid to this solution. Shake well before drinking.    Drink 1 bottle of contrast @ 12:00 pm (2 hours prior to your exam)  Drink 1 bottle of contrast @ 1:00 pm (1 hour prior to your exam)  You may take any medications as prescribed with a small amount of water except for the following: Metformin, Glucophage, Glucovance, Avandamet, Riomet, Fortamet, Actoplus Met, Janumet, Glumetza or Metaglip. The above medications must be held the day of the exam AND 48 hours after the exam.  The purpose of you drinking the oral contrast is to aid in the visualization of your intestinal tract. The contrast solution may cause some diarrhea. Before your exam is started, you will be given a small amount of fluid to drink. Depending on your individual set of symptoms, you may also receive an  intravenous injection of x-ray contrast/dye. Plan on being at [facility] for 30 minutes or long, depending on the type of exam you are having performed.  This test typically takes 30-45 minutes to complete.  If you have any questions regarding your exam or if you need to reschedule, you may call the CT department at [phone] between the hours of 8:00 am and 5:00 pm, Monday-Friday.  ________________________________________________________________________ CC:Dr Marton Redwood

## 2014-02-27 ENCOUNTER — Ambulatory Visit (INDEPENDENT_AMBULATORY_CARE_PROVIDER_SITE_OTHER)
Admission: RE | Admit: 2014-02-27 | Discharge: 2014-02-27 | Disposition: A | Discharge status: Home / Self Care | Payer: BC Managed Care – PPO | Source: Ambulatory Visit | Attending: Internal Medicine | Admitting: Internal Medicine

## 2014-02-27 DIAGNOSIS — K5000 Crohn's disease of small intestine without complications: Secondary | ICD-10-CM

## 2014-02-27 MED ORDER — IOHEXOL 300 MG/ML  SOLN
100.0000 mL | Freq: Once | INTRAMUSCULAR | Status: AC | PRN
  Administered 2014-02-27: 100 mL via INTRAVENOUS

## 2014-02-28 ENCOUNTER — Telehealth: Payer: Self-pay | Admitting: Internal Medicine

## 2014-02-28 MED ORDER — BUDESONIDE 3 MG PO CP24: 9.0000 mg | ORAL_CAPSULE | Freq: Every day | ORAL | Status: DC

## 2014-02-28 MED ORDER — METRONIDAZOLE 250 MG PO TABS: 250.0000 mg | ORAL_TABLET | Freq: Three times a day (TID) | ORAL | Status: DC

## 2014-02-28 MED ORDER — CIPROFLOXACIN HCL 500 MG PO TABS: 500.0000 mg | ORAL_TABLET | Freq: Two times a day (BID) | ORAL | Status: DC

## 2014-02-28 NOTE — Telephone Encounter (Signed)
Patient notified of CT results

## 2014-03-10 ENCOUNTER — Telehealth: Payer: Self-pay | Admitting: Internal Medicine

## 2014-03-10 NOTE — Telephone Encounter (Signed)
Left a message for patient to call back. 

## 2014-03-10 NOTE — Telephone Encounter (Signed)
Spoke with patient and she states she is not improved. She has completed the Prednisone and will finish her antibiotics tonight. She still is having sever pain when she eats or sometimes just randomly. She is worse in the mornings. Please, advise.

## 2014-03-10 NOTE — Telephone Encounter (Signed)
I have left a message for the pt on her mobile device: obviously the short term steroids were not enough. She will need higher steroids for longer period of time ( months or so).She can think about it and let you know tomorrow. The alternative may be biologicals  Or surgery. She should try steroids first. She will call back. I would keep her on Prednisone  30 mg po at least 2-4 weeks before going down.

## 2014-03-11 MED ORDER — ALPRAZOLAM 0.25 MG PO TABS: ORAL_TABLET | ORAL | Status: DC

## 2014-03-11 MED ORDER — PREDNISONE 10 MG PO TABS: ORAL_TABLET | ORAL | Status: DC

## 2014-03-11 NOTE — Telephone Encounter (Signed)
Patient called back and she would like to do the steroids.

## 2014-03-11 NOTE — Telephone Encounter (Signed)
Please send Xanax .25 mg, 1/2 or 1 tab po q 6-8 hour prn anxiety. #30, 1 refill.

## 2014-03-11 NOTE — Telephone Encounter (Signed)
Spoke with patient and gave her recommendations. She would like Xanax to have on hand while on Prednisone. Please, advise.

## 2014-03-11 NOTE — Telephone Encounter (Signed)
Please start Prednisone 10mg , #100, 2 refills, start  3 po qd x 4 weeks, then call with an update to see if we can start going down. Does she want Xanax or Ativan while on steroids?

## 2014-03-11 NOTE — Telephone Encounter (Signed)
Faxed rx

## 2014-03-18 ENCOUNTER — Ambulatory Visit: Payer: BC Managed Care – PPO | Admitting: Internal Medicine

## 2014-03-21 ENCOUNTER — Encounter (HOSPITAL_COMMUNITY): Payer: Self-pay | Admitting: *Deleted

## 2014-03-21 ENCOUNTER — Telehealth: Payer: Self-pay | Admitting: Internal Medicine

## 2014-03-21 ENCOUNTER — Encounter: Payer: Self-pay | Admitting: Physician Assistant

## 2014-03-21 ENCOUNTER — Inpatient Hospital Stay (HOSPITAL_COMMUNITY)
Admission: AD | Admit: 2014-03-21 | Discharge: 2014-03-23 | DRG: 387 | Disposition: A | Discharge status: Home / Self Care | Payer: BC Managed Care – PPO | Source: Ambulatory Visit | Attending: Internal Medicine | Admitting: Internal Medicine

## 2014-03-21 ENCOUNTER — Inpatient Hospital Stay (HOSPITAL_COMMUNITY): Payer: BC Managed Care – PPO

## 2014-03-21 ENCOUNTER — Ambulatory Visit (INDEPENDENT_AMBULATORY_CARE_PROVIDER_SITE_OTHER): Payer: BC Managed Care – PPO | Admitting: Physician Assistant

## 2014-03-21 VITALS — BP 104/66 | HR 88 | Ht 62.0 in | Wt 114.0 lb

## 2014-03-21 DIAGNOSIS — R109 Unspecified abdominal pain: Secondary | ICD-10-CM

## 2014-03-21 DIAGNOSIS — K602 Anal fissure, unspecified: Secondary | ICD-10-CM | POA: Diagnosis present

## 2014-03-21 DIAGNOSIS — F419 Anxiety disorder, unspecified: Secondary | ICD-10-CM | POA: Diagnosis present

## 2014-03-21 DIAGNOSIS — Z882 Allergy status to sulfonamides status: Secondary | ICD-10-CM

## 2014-03-21 DIAGNOSIS — N8320 Unspecified ovarian cysts: Secondary | ICD-10-CM | POA: Diagnosis present

## 2014-03-21 DIAGNOSIS — J45909 Unspecified asthma, uncomplicated: Secondary | ICD-10-CM | POA: Diagnosis present

## 2014-03-21 DIAGNOSIS — Z7952 Long term (current) use of systemic steroids: Secondary | ICD-10-CM

## 2014-03-21 DIAGNOSIS — I7300 Raynaud's syndrome without gangrene: Secondary | ICD-10-CM | POA: Diagnosis present

## 2014-03-21 DIAGNOSIS — Z79899 Other long term (current) drug therapy: Secondary | ICD-10-CM

## 2014-03-21 DIAGNOSIS — Z888 Allergy status to other drugs, medicaments and biological substances status: Secondary | ICD-10-CM

## 2014-03-21 DIAGNOSIS — K5000 Crohn's disease of small intestine without complications: Secondary | ICD-10-CM | POA: Insufficient documentation

## 2014-03-21 DIAGNOSIS — D649 Anemia, unspecified: Secondary | ICD-10-CM | POA: Diagnosis present

## 2014-03-21 DIAGNOSIS — F329 Major depressive disorder, single episode, unspecified: Secondary | ICD-10-CM | POA: Diagnosis present

## 2014-03-21 DIAGNOSIS — K50018 Crohn's disease of small intestine with other complication: Secondary | ICD-10-CM

## 2014-03-21 LAB — CBC
HCT: 38.2 % (ref 36.0–46.0)
Hemoglobin: 12 g/dL (ref 12.0–15.0)
MCH: 22.8 pg — ABNORMAL LOW (ref 26.0–34.0)
MCHC: 31.4 g/dL (ref 30.0–36.0)
MCV: 72.6 fL — ABNORMAL LOW (ref 78.0–100.0)
Platelets: 520 10*3/uL — ABNORMAL HIGH (ref 150–400)
RBC: 5.26 MIL/uL — ABNORMAL HIGH (ref 3.87–5.11)
RDW: 14.8 % (ref 11.5–15.5)
WBC: 14.6 10*3/uL — ABNORMAL HIGH (ref 4.0–10.5)

## 2014-03-21 LAB — COMPREHENSIVE METABOLIC PANEL
ALT: <5 U/L (ref 0–35)
AST: 8 U/L (ref 0–37)
Albumin: 3.9 g/dL (ref 3.5–5.2)
Alkaline Phosphatase: 76 U/L (ref 39–117)
Anion gap: 17 — ABNORMAL HIGH (ref 5–15)
BUN: 8 mg/dL (ref 6–23)
CO2: 23 mEq/L (ref 19–32)
Calcium: 9.4 mg/dL (ref 8.4–10.5)
Chloride: 97 mEq/L (ref 96–112)
Creatinine, Ser: 0.9 mg/dL (ref 0.50–1.10)
GFR calc Af Amer: >90 mL/min (ref >90)
GFR calc non Af Amer: 84 mL/min — ABNORMAL LOW (ref >90)
Glucose, Bld: 76 mg/dL (ref 70–99)
Potassium: 4.1 mEq/L (ref 3.7–5.3)
Sodium: 137 mEq/L (ref 137–147)
Total Bilirubin: 0.4 mg/dL (ref 0.3–1.2)
Total Protein: 7.9 g/dL (ref 6.0–8.3)

## 2014-03-21 LAB — PROTIME-INR
INR: 1.03 (ref 0.00–1.49)
Prothrombin Time: 13.6 seconds (ref 11.6–15.2)

## 2014-03-21 MED ORDER — SODIUM CHLORIDE 0.9 % IV SOLN: 250.0000 mL | INTRAVENOUS | Status: DC | PRN

## 2014-03-21 MED ORDER — ACETAMINOPHEN 650 MG RE SUPP: 650.0000 mg | Freq: Four times a day (QID) | RECTAL | Status: DC | PRN

## 2014-03-21 MED ORDER — METHYLPREDNISOLONE SODIUM SUCC 40 MG IJ SOLR
40.0000 mg | Freq: Every day | INTRAMUSCULAR | Status: DC
  Administered 2014-03-21 – 2014-03-23 (×3): 40 mg via INTRAVENOUS
  Filled 2014-03-21 (×3): qty 1

## 2014-03-21 MED ORDER — SODIUM CHLORIDE 0.9 % IJ SOLN
3.0000 mL | Freq: Two times a day (BID) | INTRAMUSCULAR | Status: DC
  Administered 2014-03-21 – 2014-03-23 (×2): 3 mL via INTRAVENOUS

## 2014-03-21 MED ORDER — IOHEXOL 300 MG/ML  SOLN
25.0000 mL | INTRAMUSCULAR | Status: AC
  Administered 2014-03-21 (×2): 25 mL via ORAL

## 2014-03-21 MED ORDER — ONDANSETRON HCL 4 MG/2ML IJ SOLN: 4.0000 mg | Freq: Four times a day (QID) | INTRAMUSCULAR | Status: DC | PRN

## 2014-03-21 MED ORDER — IOHEXOL 300 MG/ML  SOLN
100.0000 mL | Freq: Once | INTRAMUSCULAR | Status: AC | PRN
  Administered 2014-03-21: 100 mL via INTRAVENOUS

## 2014-03-21 MED ORDER — SODIUM CHLORIDE 0.9 % IJ SOLN: 3.0000 mL | INTRAMUSCULAR | Status: DC | PRN

## 2014-03-21 MED ORDER — DICYCLOMINE HCL 10 MG PO CAPS
10.0000 mg | ORAL_CAPSULE | Freq: Three times a day (TID) | ORAL | Status: DC | PRN
  Filled 2014-03-21: qty 1

## 2014-03-21 MED ORDER — HYDROMORPHONE HCL 1 MG/ML IJ SOLN: 1.0000 mg | INTRAMUSCULAR | Status: DC | PRN

## 2014-03-21 MED ORDER — BUPROPION HCL ER (XL) 150 MG PO TB24
150.0000 mg | ORAL_TABLET | Freq: Every day | ORAL | Status: DC
Start: 2014-03-21 — End: 2014-03-23
  Administered 2014-03-22 – 2014-03-23 (×2): 150 mg via ORAL
  Filled 2014-03-21 (×3): qty 1

## 2014-03-21 MED ORDER — ACETAMINOPHEN 325 MG PO TABS
650.0000 mg | ORAL_TABLET | Freq: Four times a day (QID) | ORAL | Status: DC | PRN
  Administered 2014-03-22: 650 mg via ORAL
  Filled 2014-03-21: qty 2

## 2014-03-21 MED ORDER — HYDROCODONE-ACETAMINOPHEN 5-325 MG PO TABS: 1.0000 | ORAL_TABLET | ORAL | Status: DC | PRN

## 2014-03-21 MED ORDER — ONDANSETRON HCL 4 MG PO TABS: 4.0000 mg | ORAL_TABLET | Freq: Four times a day (QID) | ORAL | Status: DC | PRN

## 2014-03-21 MED ORDER — SODIUM CHLORIDE 0.9 % IV SOLN
INTRAVENOUS | Status: DC
  Administered 2014-03-21 – 2014-03-22 (×2): via INTRAVENOUS

## 2014-03-21 MED ORDER — ALPRAZOLAM 0.25 MG PO TABS: 0.2500 mg | ORAL_TABLET | Freq: Three times a day (TID) | ORAL | Status: DC | PRN

## 2014-03-21 NOTE — Patient Instructions (Signed)
Please go to the admitting department at [facility] for admission.

## 2014-03-21 NOTE — H&P (Signed)
[facility] Admission Note   Primary Care Physician:  Martha Clan, MD Primary Gastroenterologist:  Dr. Juanda Chance  CHIEF COMPLAINT:  Abdominal pain, nausea  HPI: Sophia Beard is a 33 y.o. female with a history of Crohn's disease of the terminal ileum since 2006 she had had a CT scan of the abdomen in 2012 that showed ileitis a prior CT in 2006 showed terminal ileum involvement as well she was seen by Mike Gip, PAC in August of 2015 and was started on Entocort 9 mg daily and Pentasa 500 mg 3 times daily. She was then seen in September by Dr. Juanda Chance at which time she was feeling worse. She was sent for an abdominal and pelvic CT scan that showed a focally thickened and inflamed loop of terminal ileum most compatible with Crohn's. There was a 9 mm outpouching along the posterior aspect of the focally thickened loop of bowel containing a small focus of gas which was felt to possibly represent a small contained perforation or diverticulum. Patient was placed on Cipro and Flagyl and returns today she is feeling worse. At her 9/29 visit, she was also started on prednisone, and her course of prednisone was extended on 10/12 as the pt still did not feel well after completing her course of antibiotics. Although she is not vomiting she is constantly nauseous and is afraid to eat or drink anything. Her abdominal pain is worse and is fairly constant her pain is exacerbated by ingestion of any food or drink and she rates her pain as an * on a scale of 1-10. She has not had any fever or chills she has had a small amount of diarrhea. She has lost approximately 8 pounds in the past month    Past Medical History  Diagnosis Date  . Ovarian cyst   . Anemia   . Asthma   . Depression   . Crohn disease   . Anal fissure   . Anxiety   . GI bleed   . Raynaud's disease     Past Surgical History  Procedure Laterality Date  . Neg hx      Prior to  Admission medications   Medication Sig Start Date End Date Taking? Authorizing Provider  buPROPion (WELLBUTRIN XL) 150 MG 24 hr tablet Take 150 mg by mouth daily.   Yes Historical Provider, MD  dicyclomine (BENTYL) 10 MG capsule Take 10 mg by mouth 3 (three) times daily as needed for spasms (stomach spasms).   Yes Historical Provider, MD  predniSONE (DELTASONE) 10 MG tablet Take 30 mg by mouth daily with breakfast.   Yes Historical Provider, MD  Prenatal Vit-Fe Fumarate-FA (PRENATAL PLUS/IRON) 27-1 MG TABS Take 1 tablet by mouth every 30 (thirty) days. Take one tablet by mouth couple times a week.   Yes Historical Provider, MD    Current Facility-Administered Medications  Medication Dose Route Frequency Provider Last Rate Last Dose  . 0.9 %  sodium chloride infusion  250 mL Intravenous PRN Soniya Ashraf P Aryianna Earwood, PA-C      . acetaminophen (TYLENOL) tablet 650 mg  650 mg Oral Q6H PRN Zyeir Dymek P Rosalba Totty, PA-C       Or  . acetaminophen (TYLENOL) suppository 650 mg  650 mg Rectal Q6H PRN Keyarah Mcroy P Yordin Rhoda, PA-C      . ALPRAZolam (XANAX) tablet 0.25 mg  0.25 mg Oral TID PRN Shanta Dorvil P Maytte Jacot, PA-C      . buPROPion (WELLBUTRIN XL) 24 hr tablet 150 mg  150  mg Oral Daily Cheynne Virden P Takuya Lariccia, PA-C      . dicyclomine (BENTYL) capsule 10 mg  10 mg Oral TID PRN Lorah Kalina P Harith Mccadden, PA-C      . HYDROcodone-acetaminophen (NORCO/VICODIN) 5-325 MG per tablet 1-2 tablet  1-2 tablet Oral Q4H PRN Colter Magowan P Ourania Hamler, PA-C      . HYDROmorphone (DILAUDID) injection 1 mg  1 mg Intravenous Q4H PRN Hazen Brumett P Lynsay Fesperman, PA-C      . methylPREDNISolone sodium succinate (SOLU-MEDROL) 40 mg/mL injection 40 mg  40 mg Intravenous Daily Marvel Mcphillips P Elenore Wanninger, PA-C      . ondansetron (ZOFRAN) tablet 4 mg  4 mg Oral Q6H PRN Gabriella Guile P Helmuth Recupero, PA-C       Or  . ondansetron (ZOFRAN) injection 4 mg  4 mg Intravenous Q6H PRN Danyle Boening P Jekhi Bolin, PA-C      . sodium chloride 0.9 % injection 3 mL  3 mL Intravenous Q12H Marlee Armenteros P Peola Joynt, PA-C      . sodium chloride  0.9 % injection 3 mL  3 mL Intravenous PRN Arsh Feutz P Lillianah Swartzentruber, PA-C        Allergies as of 03/21/2014 - Review Complete 03/21/2014  Allergen Reaction Noted  . Iron dextran  12/20/2007  . Sulfonamide derivatives  12/17/2007    Family History  Problem Relation Age of Onset  . Colon cancer Paternal Uncle   . Heart disease Paternal Grandfather   . Irritable bowel syndrome Sister     History   Social History  . Marital Status: Married    Spouse Name: N/A    Number of Children: 2  . Years of Education: N/A   Occupational History  . Librarian, academic   .  [employer]   Social History Main Topics  . Smoking status: Never Smoker   . Smokeless tobacco: Never Used  . Alcohol Use: No  . Drug Use: No  . Sexual Activity: Not on file   Other Topics Concern  . Not on file   Social History Narrative  . No narrative on file    REVIEW OF SYSTEMS: Constitutional: No fevr, chills, night sweats ENT:  No nosebleeds, no hearing loss, no sore throat Pulm:  No cough, no SOB CV: no chest pain, non palpitations GU:  No dysuria GI:  Admits to nausea, vomiting and intermittent loose stools Heme:  No abnormal bleeding or bruising Neuro:  No headazches, dizziness, weakness Derm:  No rashes Endocrine:  No complaints I  PHYSICAL EXAM:  Temp:  [98 F (36.7 C)] 98 F (36.7 C) (10/23 1606) Pulse Rate:  [88-98] 98 (10/23 1606) Resp:  [20] 20 (10/23 1606) BP: (104-144)/(66-99) 144/99 mmHg (10/23 1606) SpO2:  [97 %] 97 % (10/23 1606) Weight:  [114 lb (51.71 kg)] 114 lb (51.71 kg) (10/23 1342) Last BM Date: 03/20/14   General: Pleasant, well developed , pale female in no acute distress  Head: Normocephalic and atraumatic  Eyes: sclerae anicteric, conjunctiva pink  Ears: Normal auditory acuity  Lungs: Clear throughout to auscultation  Heart: Regular rate and rhythm  Abdomen: Soft, non distended, mild to moderate tenderness right lower quadrant. No masses, no hepatomegaly.  hypoactivebowel sounds  Rectal: deferred  Musculoskeletal: Symmetrical with no gross deformities  Extremities: No edema  Neurological: Alert oriented x 4, grossly nonfocal  Psychological: Alert and cooperative. Normal mood and affect   LAB RESULTS: CBC on 01/20/2014 had a platelet count of 567,000 hemoglobin 12.6 hematocrit 38.9, white blood cell count was 10.3.  CRP on  01/20/2014 was 15.67.   RADIOLOGY STUDIES: Ct Abdomen Pelvis W Contrast  02/27/2014 CLINICAL DATA: Patient with history of Crohn's ileitis. Six weeks of diffuse abdominal pain. EXAM: CT ABDOMEN AND PELVIS WITH CONTRAST TECHNIQUE: Multidetector CT imaging of the abdomen and pelvis was performed using the standard protocol following bolus administration of intravenous contrast. CONTRAST: 100mL OMNIPAQUE IOHEXOL 300 MG/ML SOLN COMPARISON: CT 01/03/2011 FINDINGS: Lower chest: No consolidative or nodular pulmonary opacities. No pleural effusion. Normal heart size. Hepatobiliary: Normal in size and contour without focal hepatic lesion identified. Gallbladder is unremarkable. No intrahepatic or extrahepatic biliary ductal dilatation. Pancreas: Unremarkable Spleen: Unremarkable Adrenals/Urinary Tract: Symmetric renal enhancement. No hydronephrosis. Normal adrenal glands. Urinary bladder is unremarkable. Stomach/Bowel: Oral contrast material is demonstrated throughout the majority of the small bowel. There is a short segment of distal ileum with circumferential wall thickening and mucosal hyperenhancement. There is surrounding fat stranding. Off the posterior aspect of the inflamed loop of bowel there is a 9 mm outpouching which has a small foci of gas centrally, potentially representing a small focally contained perforation or small diverticulum. The appendix is visualized within the right lower quadrant. The distal aspect of the appendix is prominent measuring up to 6 mm without surrounding inflammatory change. Vascular/Lymphatic: Multiple  prominent right lower quadrant lymph nodes are identified. Normal caliber abdominal aorta. Reproductive: Re- demonstrated 3.2 cm right adnexal cyst. The uterus and left ovary are unremarkable Other: No free intraperitoneal air or large amount of free intraperitoneal fluid. Musculoskeletal: No aggressive or acute appearing osseous lesions. IMPRESSION: Focally thickened and inflamed loop of terminal ileum most compatible with Crohn's. There is a 9 mm outpouching along the posterior aspect of the focally thickened loop of bowel containing a small foci of gas which may represent a small contained perforation or diverticulum. Electronically Signed By: Annia Beltrew Davis M.D. On: 02/27/2014 14:57    IMPRESSION/PLAN:   33 year old white female with Crohn's disease of the small bowel not responding to prednisone. She has not responded to a course of antibiotics as well. She states her abdominal pain is getting worse and she has been having worsening nausea. The patient has been reviewed with Dr. Christella HartiganJacobs and is to be admitted for IV hydration, IV steroids ,and pain control. She will have a repeat CT of the abdomen and pelvis. The patient has in the past discussed possible biologic therapy with Dr. Dickie LaBrody and the patient states she is interested in pursuing this is she is found to be an appropriate candidate. Further recommendations will be made pending the findings of her CT scan.    LOS: 0 days   Tykwon Fera, Tollie PizzaLori P  03/21/2014, 4:33 PM Pager [phone]

## 2014-03-21 NOTE — Progress Notes (Signed)
________________________________________________________________________  [facility] MD note:  I personally examined the patient, reviewed the data and agree with the assessment and plan described above.  She needs in patient care; repeat CT scan, hydration, likely iv steroids.   Rob Bunting, MD [facility] Pager [phone]

## 2014-03-21 NOTE — Telephone Encounter (Signed)
Patient calling to report last night she almost went to ED last night due to severe abdominal pain. States the pain is above belly button in center of abdomen. States she had not eaten much yesterday. States she is not having much of bowel movement. Feels like she has an air pocket in abdomen and is bloated. Denies nausea, vomiting or bleeding. She is taking Prednisone 30 mg daily since Oct. 12. She reports she is not taking Pentasa because it is $800 to fill it and she cannot do this. Please, advise.

## 2014-03-21 NOTE — Telephone Encounter (Signed)
Spoke with Dr. Juanda Chance and scheduled OV with Lawson Fiscal Hvozdovic, PA-C today at 1:45 PM. Patient aware.

## 2014-03-21 NOTE — Progress Notes (Addendum)
Patient ID: Sophia Beard, female   DOB: Apr 08, 1981, 33 y.o.   MRN: [id number]     History of Present Illness: Patient is a 33 year old white female with a history of Crohn's disease of the terminal ileum since 2006 she had had a CT scan of the abdomen in 2012 that showed ileitis a prior CT in 2006 showed terminal ileum involvement as well she was seen by Mike Gip, PAC in August of 2015 and was started on Entocort 9 mg daily and Pentasa 500 mg 3 times daily. She was then seen in September by Dr. Elton Sin at which time she was feeling worse. She was sent for an abdominal and pelvic CT scan that showed a focally thickened and inflamed loop of terminal ileum most compatible with Crohn's. There was a 9 mm outpouching along the posterior aspect of the focally thickened loop of bowel containing a small focus of gas which was felt to possibly represent a small contained perforation or diverticulum. Patient was placed on Cipro and Flagyl and returns today she is feeling worse .Although she is not vomiting she is constantly nauseous and is afraid to eat or drink anything. Her abdominal pain is worse and is fairly constant her pain is exacerbated by ingestion of any food or drink. She rates her pain as an 8 on a scale of 1-10. She has not had any fever or chills she has had a small amount of diarrhea. She has lost approximately 8 pounds in the past month   Past Medical History  Diagnosis Date  . Ovarian cyst   . Anemia   . Asthma   . Depression   . Crohn disease   . Anal fissure   . Anxiety   . GI bleed   . Raynaud's disease     Past Surgical History  Procedure Laterality Date  . Neg hx     Family History  Problem Relation Age of Onset  . Colon cancer Paternal Uncle   . Heart disease Paternal Grandfather   . Irritable bowel syndrome Sister    History  Substance Use Topics  . Smoking status: Never Smoker   . Smokeless tobacco: Never Used  . Alcohol Use: No   Current Outpatient  Prescriptions  Medication Sig Dispense Refill  . ALPRAZolam (XANAX) 0.25 MG tablet Take 1/2 or 1 tablet po every 6-8 hours prn anxiety.  30 tablet  1  . buPROPion (WELLBUTRIN XL) 150 MG 24 hr tablet Take 150 mg by mouth daily.      Marland Kitchen dicyclomine (BENTYL) 10 MG capsule Take 1 capsule (10 mg total) by mouth 3 (three) times daily as needed.  90 capsule  2  . predniSONE (DELTASONE) 10 MG tablet Take 3 tablets po daily x 4 weeks, then call office for instructions on taper.  100 tablet  2  . Prenatal Vit-Fe Fumarate-FA (PRENATAL PLUS/IRON) 27-1 MG TABS Take one tablet by mouth couple times a week.       No current facility-administered medications for this visit.   Allergies  Allergen Reactions  . Iron Dextran   . Sulfonamide Derivatives       Review of Systems: Gen: Denies any fever, chills, sweats, anorexia, fatigue, weakness, malaise, weight loss, and sleep disorder CV: Denies chest pain, angina, palpitations, syncope, orthopnea, PND, peripheral edema, and claudication. Resp: Denies dyspnea at rest, dyspnea with exercise, cough, sputum, wheezing, coughing up blood, and pleurisy. GI: Denies vomiting blood, jaundice, and fecal incontinence.   Denies dysphagia or odynophagia.Admits  to nausea and abdominal pain. GU : Denies urinary burning, blood in urine, urinary frequency, urinary hesitancy, nocturnal urination, and urinary incontinence. MS: Denies joint pain, limitation of movement, and swelling, stiffness, low back pain, extremity pain. Denies muscle weakness, cramps, atrophy.  Derm: Denies rash, itching, dry skin, hives, moles, warts, or unhealing ulcers.  Psych: Denies depression, anxiety, memory loss, suicidal ideation, hallucinations, paranoia, and confusion. Heme: Denies bruising, bleeding, and enlarged lymph nodes. Neuro:  Denies any headaches, dizziness, paresthesia Endo:  Denies any problems with DM, thyroid, adrenal   LABS: CBC on 01/20/2014 had a platelet count of 567,000  hemoglobin 12.6 hematocrit 38.9, white blood cell count was 10.3. CRP on 01/20/2014 was 15.67. Studies:   Ct Abdomen Pelvis W Contrast  02/27/2014   CLINICAL DATA:  Patient with history of Crohn's ileitis. Six weeks of diffuse abdominal pain.  EXAM: CT ABDOMEN AND PELVIS WITH CONTRAST  TECHNIQUE: Multidetector CT imaging of the abdomen and pelvis was performed using the standard protocol following bolus administration of intravenous contrast.  CONTRAST:  100mL OMNIPAQUE IOHEXOL 300 MG/ML  SOLN  COMPARISON:  CT 01/03/2011  FINDINGS: Lower chest: No consolidative or nodular pulmonary opacities. No pleural effusion. Normal heart size.  Hepatobiliary: Normal in size and contour without focal hepatic lesion identified. Gallbladder is unremarkable. No intrahepatic or extrahepatic biliary ductal dilatation.  Pancreas: Unremarkable  Spleen: Unremarkable  Adrenals/Urinary Tract: Symmetric renal enhancement. No hydronephrosis. Normal adrenal glands. Urinary bladder is unremarkable.  Stomach/Bowel: Oral contrast material is demonstrated throughout the majority of the small bowel. There is a short segment of distal ileum with circumferential wall thickening and mucosal hyperenhancement. There is surrounding fat stranding. Off the posterior aspect of the inflamed loop of bowel there is a 9 mm outpouching which has a small foci of gas centrally, potentially representing a small focally contained perforation or small diverticulum. The appendix is visualized within the right lower quadrant. The distal aspect of the appendix is prominent measuring up to 6 mm without surrounding inflammatory change.  Vascular/Lymphatic: Multiple prominent right lower quadrant lymph nodes are identified. Normal caliber abdominal aorta.  Reproductive: Re- demonstrated 3.2 cm right adnexal cyst. The uterus and left ovary are unremarkable  Other: No free intraperitoneal air or large amount of free intraperitoneal fluid.  Musculoskeletal: No  aggressive or acute appearing osseous lesions.  IMPRESSION: Focally thickened and inflamed loop of terminal ileum most compatible with Crohn's. There is a 9 mm outpouching along the posterior aspect of the focally thickened loop of bowel containing a small foci of gas which may represent a small contained perforation or diverticulum.   Electronically Signed   By: Annia Beltrew  Davis M.D.   On: 02/27/2014 14:57     Physical Exam General: Pleasant, well developed , pale female in no acute distress Head: Normocephalic and atraumatic Eyes:  sclerae anicteric, conjunctiva pink  Ears: Normal auditory acuity Lungs: Clear throughout to auscultation Heart: Regular rate and rhythm Abdomen: Soft, non distended, mild to moderate tenderness right lower quadrant. No masses, no hepatomegaly.  hypoactivebowel sounds Rectal: deferred Musculoskeletal: Symmetrical with no gross deformities  Extremities: No edema  Neurological: Alert oriented x 4, grossly nonfocal Psychological:  Alert and cooperative. Normal mood and affect  Assessment and Recommendations: 33 year old white female with Crohn's disease of the small bowel not responding to prednisone. She has not responded to a course of antibiotics as well. She states her abdominal pain is getting worse and she has been having worsening nausea. The patient has been reviewed with  Dr. Christella Hartigan and is to be admitted for IV hydration, IV steroids, and pain control. She will have a repeat CT of the abdomen and pelvis. The patient has in the past discussed possible biologic therapy with Dr. Dickie La and the patient states she is interested in pursuing this is she is found to be an appropriate candidate. Further recommendations will be made pending the findings of her CT scan.        Darol Cush, Moise Boring 03/21/2014, Pager [phone]

## 2014-03-21 NOTE — Progress Notes (Signed)
Utilization Review completed.  Monette Omara RN CM  

## 2014-03-22 LAB — HEPATITIS B SURFACE ANTIBODY,QUALITATIVE: Hep B S Ab: POSITIVE — AB

## 2014-03-22 LAB — HEPATITIS B SURFACE ANTIGEN: Hepatitis B Surface Ag: NEGATIVE

## 2014-03-22 NOTE — Progress Notes (Signed)
Cross cover [facility] Subjective: Notes reviewed. CT reviewed and discussed with patient. She claims she is feeling much better since she was admitted and IV steroids seem to be helping. She is interested in starting biologics. The abdominal pain she was having has completely resolved. She wants to go home as soon as possible.   Objective: Vital signs in last 24 hours: Temp:  [97.3 F (36.3 C)-98.2 F (36.8 C)] 97.3 F (36.3 C) (10/24 0537) Pulse Rate:  [81-98] 86 (10/24 0537) Resp:  [16-20] 16 (10/24 0537) BP: (104-144)/(66-99) 118/81 mmHg (10/24 0537) SpO2:  [97 %-100 %] 100 % (10/24 0537) Weight:  [51.71 kg (114 lb)] 51.71 kg (114 lb) (10/23 1342) Last BM Date: 03/20/14  Intake/Output from previous day: 10/23 0701 - 10/24 0700 In: 706.7 [I.V.:706.7] Out: -  Intake/Output this shift:   General appearance: alert, cooperative, appears stated age and no distress Resp: clear to auscultation bilaterally Cardio: regular rate and rhythm, S1, S2 normal, no murmur, click, rub or gallop GI: soft, non-tender; bowel sounds normal; no masses,  no organomegaly Extremities: extremities normal, atraumatic, no cyanosis or edema  Lab Results:  Recent Labs  03/21/14 1721  WBC 14.6*  HGB 12.0  HCT 38.2  PLT 520*   BMET  Recent Labs  03/21/14 1721  NA 137  K 4.1  CL 97  CO2 23  GLUCOSE 76  BUN 8  CREATININE 0.90  CALCIUM 9.4   LFT  Recent Labs  03/21/14 1721  PROT 7.9  ALBUMIN 3.9  AST 8  ALT <5  ALKPHOS 76  BILITOT 0.4   PT/INR  Recent Labs  03/21/14 1721  LABPROT 13.6  INR 1.03   Hepatitis Panel  Recent Labs  03/21/14 1719  HEPBSAG NEGATIVE   Studies/Results: Ct Abdomen Pelvis W Contrast  03/21/2014   CLINICAL DATA:  Mid to lower abdominal pain for 1 week. History of Crohn's disease since 2006. No prior surgeries.  EXAM: CT ABDOMEN AND PELVIS WITH CONTRAST  TECHNIQUE: Multidetector CT imaging of the abdomen and pelvis was performed using the standard  protocol following bolus administration of intravenous contrast.  CONTRAST:  100mL OMNIPAQUE IOHEXOL 300 MG/ML  SOLN  COMPARISON:  02/27/2014.  FINDINGS: The distal ileum shows irregular wall thickening and congestion of the mesenteric vessels with a few prominent sub cm mesenteric lymph nodes. The wall thickening and inflammation extends from the ileocecal junction proximally for approximately 15 cm.  The remainder of the small bowel shows normal wall thickness. There are no other areas of bowel wall inflammation. No colonic wall thickening or inflammatory changes seen. Stomach and duodenum are unremarkable.  Clear lung bases.  Heart is normal in size.  Small low-density lesion is noted in the lateral segment of the left lobe of the liver measuring 8 mm. This was not evident previously. Has some nodular enhancement along its periphery. This may be a hemangioma. Liver otherwise unremarkable.  Normal spleen, gallbladder and pancreas. No bile duct dilation. No adrenal masses. Kidneys, ureters and bladder are unremarkable.  There is a septated cystic lesion in the left adnexa measuring 4.4 cm x 3.2 cm by 3.6 cm, most consistent with the left ovary mildly enlarged by cysts. These are physiologic sized this patient's age.  No pathologically enlarged lymph nodes. No ascites. No evidence of an abscess.  No significant bony abnormality.  IMPRESSION: 1. Active inflammatory bowel disease involving the terminal 15 cm of the distal ileum. Mild associated mesenteric inflammation. No abscess or fistula. 2. No other  areas of bowel inflammation. 3. No other acute findings.   Electronically Signed   By: Amie Portland M.D.   On: 03/21/2014 19:23   Medications: I have reviewed the patient's current medications.  Assessment/Plan: 1) Crohn's disease with terminal ileitis on CT: ?focal perforation vs a diverticulum-seems to be more in favor of a diverticulum as this was noted on a CT done earlier in the month. Will need to have TB  Gold done and a chest X-ray along with Hepatitis profile checked. Continue present care. Will allow patient some clear liquids and advance as tolerated. She would like to go home tomorrow. She can be discharged on oral steroids and have biologics started on an OP basis.  LOS: 1 day   Sophia Beard 03/22/2014, 10:47 AM

## 2014-03-22 NOTE — Progress Notes (Signed)
I have discussed the case with Dr Christella Hartigan and Marthe Patch.Hvozdovich and agree with plans for admission. Would try bowl rest, IV steroids and preliminaries prior to starting biologicals.

## 2014-03-23 LAB — QUANTIFERON TB GOLD ASSAY (BLOOD)
Interferon Gamma Release Assay: NEGATIVE
Mitogen value: 5.3 IU/mL
Quantiferon Nil Value: 0.03 IU/mL
TB Ag value: 0.03 IU/mL
TB Antigen Minus Nil Value: 0 IU/mL

## 2014-03-23 NOTE — Discharge Summary (Signed)
Physician Discharge Summary  Patient ID: Sophia Beard MRN: [id number] DOB/AGE: Aug 26, 1980 32 y.o.  Admit date: 03/21/2014 Discharge date: 03/23/2014  Admission Diagnoses:  Discharge Diagnoses:  Active Problems:   Crohn's ileitis   Discharged Condition: good  Hospital Course: Patient was admitted from the office on Friday with worsening RLQ pain and a repeat CT showed ileitis in the T.I with a ?of a contained perforation vs a diverticulum in the T.I. This was also noted on a CT scan done earlier this month. Patient's  abdominal pain resolved completely with IV steroids. She has a BM this morning and is tolerating her low residue diet well.   Consults: None  Significant Diagnostic Studies: radiology: CT scan of the abdomen and pelvis.  Treatments: IV hydration and steroids: solu-medrol  Discharge Exam: Blood pressure 119/79, pulse 81, temperature 97.8 F (36.6 C), temperature source Oral, resp. rate 17, height 5\' 2"  (1.575 m), weight 51.256 kg (113 lb), last menstrual period 03/09/2014, SpO2 100.00%. General appearance: alert, cooperative, appears stated age and no distress Neck; Supple Chest: CTA S1 and S2 regular.  Abdomen; Soft NTNABS  Disposition: 01-Home or Self Care    Medication List    ASK your doctor about these medications       buPROPion 150 MG 24 hr tablet  Commonly known as:  WELLBUTRIN XL  Take 150 mg by mouth daily.     dicyclomine 10 MG capsule  Commonly known as:  BENTYL  Take 10 mg by mouth 3 (three) times daily as needed for spasms (stomach spasms).     predniSONE 10 MG tablet-4 PER DAY   Commonly known as:  DELTASONE  Take 30 mg by mouth daily with breakfast.     PRENATAL PLUS/IRON 27-1 MG Tabs  Take 1 tablet by mouth every 30 (thirty) days. Take one tablet by mouth couple times a week.       Diet: Low residue diet.  Follow up with Dr.Dora Juanda Chance in 1 week.  Signed: Solon Alban 03/23/2014, 11:01 AM

## 2014-03-23 NOTE — Progress Notes (Signed)
Pt discharged to home. DC instructions given with female family member at bedside. No concerns voiced. Left unit in good condition ambulating off unit accompanied by female family member. Vwilliams,rn.

## 2014-03-24 ENCOUNTER — Telehealth: Payer: Self-pay | Admitting: Internal Medicine

## 2014-03-24 NOTE — Telephone Encounter (Signed)
Pt states that she went home from the hospital yesterday after eating breakfast. States that she tried to eat a biscuit last evening and the abdominal pain came back. She went to liquids and today has had some jello and seems to be doing ok. Pt has a follow-up appt with Dr. Juanda ChanceBrodie for 04/08/14. Pt wants to know if she needs to be seen prior to that appt and also states that our office needs to work on prior [place]auth for biologic med. Dr. Juanda ChanceBrodie please advise.

## 2014-03-24 NOTE — Telephone Encounter (Signed)
Pt worked in for tomorrow morning at 9:30am. Pt aware of appt.

## 2014-03-24 NOTE — Telephone Encounter (Signed)
I would like to see her this week. Tomorrow if full, May be on Friday, I am supposed to be starting at 1.00 pm, so I can see her then. Stay on reinforced liquids.

## 2014-03-25 ENCOUNTER — Encounter: Payer: Self-pay | Admitting: Internal Medicine

## 2014-03-25 ENCOUNTER — Ambulatory Visit (INDEPENDENT_AMBULATORY_CARE_PROVIDER_SITE_OTHER): Payer: BC Managed Care – PPO | Admitting: Internal Medicine

## 2014-03-25 VITALS — BP 100/80 | HR 68 | Ht 62.0 in | Wt 112.4 lb

## 2014-03-25 DIAGNOSIS — R197 Diarrhea, unspecified: Secondary | ICD-10-CM

## 2014-03-25 DIAGNOSIS — K50018 Crohn's disease of small intestine with other complication: Secondary | ICD-10-CM

## 2014-03-25 DIAGNOSIS — K5660 Unspecified intestinal obstruction: Secondary | ICD-10-CM

## 2014-03-25 MED ORDER — BOOST / RESOURCE BREEZE PO LIQD: 1.0000 | Freq: Three times a day (TID) | ORAL | Status: DC

## 2014-03-25 MED ORDER — ONDANSETRON 4 MG PO TBDP: 4.0000 mg | ORAL_TABLET | Freq: Three times a day (TID) | ORAL | Status: DC | PRN

## 2014-03-25 NOTE — Patient Instructions (Addendum)
You have been scheduled for an appointment with Dr Abbey Chatters at [facility]. Your appointment is on 04/07/14 at 3:00 pm. Please arrive at 2:30 pm for registration. Make certain to bring a list of current medications, including any over the counter medications or vitamins. Also bring your co-pay if you have one as well as your insurance cards. [facility] is located at [address], [address]. Should you need to reschedule your appointment, please contact them at [phone].  We have sent the following medications to your pharmacy for you to pick up at your convenience: Zofran 4 mg  We have given you a prescription for Resource Breeze nutritional supplement.  We will work on Web designer through [employer] for Humira.  Please follow up with Dr Juanda Chance on 04/11/14 @ 1:00 pm.  CC:Dr Eric Form

## 2014-03-25 NOTE — Progress Notes (Signed)
Sophia Beard [birth year] [id number]  Note: This dictation was prepared with Dragon digital system. Any transcriptional errors that result from this procedure are unintentional.   History of Present Illness:  This is a 33 year old white female who is post hospitalization for SBO  Due to  Crohn's disease. She was diagnosed with Crohn's disease  of the terminal ileum in 2006 localized to the last 13 cm of the terminal ileum, based on CT scans from 2006, 2012 and Sept 2015. She has also developed a possible confined perforation or a diverticulum in the distal ileum and recently treated with combination of Flagyl and Cipro for 10 days. She was hospitalized for 48 hours and was on intravenous steroids and bowel rest with marked improvement. She requested to be discharged She is only on clear liquids and some full liquids. She is  on prednisone 30 mg daily and has been on it for the past 6 weeks. Patient has lost about 6 pounds since last week. She is having diarrhea but no rectal bleeding or fever. Prior to her hospitalization, she completed a course of Cipro and Flagyl without much improvement in her symptoms. Her sedimentation rate has been elevated. Last colonoscopy in November 2006 showed active inflammation at the ileocecal valve and a granuloma on random random colon biopsies    Past Medical History  Diagnosis Date  . Ovarian cyst   . Anemia   . Asthma   . Depression   . Crohn disease   . Anal fissure   . Anxiety   . GI bleed   . Raynaud's disease     Past Surgical History  Procedure Laterality Date  . Neg hx      Allergies  Allergen Reactions  . Iron Dextran   . Sulfonamide Derivatives     Family history and social history have been reviewed.  Review of Systems: Abdominal pain right lower quadrant shortly after solid food denies fever. Positive for weight loss,  The remainder of the 10 point ROS is negative except as outlined in the H&P  Physical Exam: General  Appearancethin, in no distress Eyes  Non icteric  HEENT  Non traumatic, normocephalic  Mouth No lesion, tongue papillated, no cheilosis Neck Supple without adenopathy, thyroid not enlarged, no carotid bruits, no JVD Lungs Clear to auscultation bilaterally COR Normal S1, normal S2, regular rhythm, no murmur, quiet precordium Abdomen Soft but diffusely tender. Quiet bowel sounds. No rebound. Most of the tenderness is localized right lower quadrant  Rectal Not repeated  Extremities  No pedal edema Skin No lesions Neurological Alert and oriented x 3 Psychological Normal mood and affect  Assessment and Plan:   Problem #671 33 year old white female with Crohn's ileitis and partial small bowel obstruction localized to the last 13 cm of the distal ileum. She is somewhat improved but still partially obstructed. We will make a referral to surgery for a possible terminal ileum resection. She will also be considered for starting biologicals. She will need colonoscopy prior to surgery  We will have to find out her insurance preference. We will attempt to get her started on Humira. She had a negative TB quanteferon  on 03/21/14. I will see her again in 3 weeks again. She will purchase [employer] Breeze nutritional supplement and add to her diet to improve caloric intake.    [name] 03/25/2014

## 2014-03-27 ENCOUNTER — Telehealth: Payer: Self-pay | Admitting: Internal Medicine

## 2014-03-27 NOTE — Telephone Encounter (Signed)
Spoke with patient and told her the application has been sent to [facility] and it is being processed.

## 2014-03-28 NOTE — Telephone Encounter (Signed)
Left a message for patient that Humira has been approved through [employer]. Reference # is [id number]. Authorization good from 03/26/14-05/29/2038.

## 2014-04-07 ENCOUNTER — Encounter (INDEPENDENT_AMBULATORY_CARE_PROVIDER_SITE_OTHER): Payer: Self-pay | Admitting: General Surgery

## 2014-04-07 NOTE — Progress Notes (Signed)
Patient ID: Sophia Beard, female   DOB: 04/21/1981, 33 y.o.   MRN: [id number]  Tressy Rost. [facility] 04/07/2014 2:52 PM Location: [facility] Patient #: [id number] DOB: 13-Apr-1981 Married / Language: [place] / Race: White Female History of Present Illness Adolph Pollack MD; 04/07/2014 3:27 PM) Patient words: ileum resection.  The patient is a 33 year old female    Note:She is referred by Dr. Juanda Chance because of Crohn's disease or the terminal ileum (approximately 15 cm segment) and increasingly symptomatic partial obstruction symptoms. She was diagnosed with Crohn's disease approximately 10 years ago. She is been able to control the intermittent flareups with steroids. However, steroids are not helping as they have before and the pain and partial obstruction symptoms have persisted. She has a significant wall thickening and narrowing of the terminal ileum on CT scan. Last CT scan was done approximately 2 weeks ago. She did have evidence of a microperforation which was treated adequately with steroids and antibiotics prior to that CT. The recent CT showed resolution of that. She has been losing some weight. She gets sharp pain in the right lower quadrant area after eating solid food. She has less pain after taking in liquids. She is being considered for treatment with Biologics. Dr. Juanda Chance also wants to perform a colonoscopy on her prior to surgery. She does have some diarrhea.  Other Problems Adolph Pollack, MD; 04/07/2014 3:00 PM) CROHN'S DISEASE OF ILEUM WITHOUT COMPLICATION (555.0  K50.00)  Past Surgical History Gilmer Mor, CMA; 04/07/2014 2:53 PM) No pertinent past surgical history  Allergies (Sonya Bynum, CMA; 04/07/2014 2:53 PM) Iron *HEMATOPOIETIC AGENTS* Sulfa Antibiotics  Medication History (Sonya Bynum, CMA; 04/07/2014 2:53 PM) BuPROPion HCl ER (XL) (150MG  Tablet ER 24HR, Oral) Active. PredniSONE (10MG  Tablet, Oral) Active.  Social History Gilmer Mor, CMA; 04/07/2014 2:53 PM) Caffeine use Carbonated beverages. Tobacco use Never smoker.  Family History Gilmer Mor, CMA; 04/07/2014 2:53 PM) Hypertension Mother.     Review of Systems Lamar Laundry Bynum CMA; 04/07/2014 2:53 PM) Gastrointestinal Present- Change in Bowel Habits. Not Present- Abdominal Pain, Bloating, Bloody Stool, Chronic diarrhea, Constipation, Difficulty Swallowing, Excessive gas, Gets full quickly at meals, Hemorrhoids, Indigestion, Nausea, Rectal Pain and Vomiting.  Vitals (Sonya Bynum CMA; 04/07/2014 2:55 PM) 04/07/2014 2:54 PM Weight: 112 lb Height: 62in Body Surface Area: 1.49 m Body Mass Index: 20.48 kg/m Temp.: 63F(Temporal)  Pulse: 79 (Regular)  BP: 114/70 (Sitting, Left Arm, Standard)     Assessment & Plan Adolph Pollack MD; 04/07/2014 3:30 PM)  CROHN'S DISEASE OF ILEUM WITHOUT COMPLICATION (555.0  K50.00) Impression: Her partial obstructive symptoms of been fairly persistent despite increasing doses of steroids. She is being considered for biologics. Also, needs a colonoscopy prior to surgery. We did discuss laparoscopic ileocecectomy.  She would need a 2 day bowel prep.  Plan: She is going to see Dr. Juanda Chance this Friday. If she fails all medical treatment, we can proceed with scheduling the surgery. I have explained the procedure and risks of intestinal resection. Risks include but are not limited to bleeding, infection, wound problems, anesthesia, anastomotic leak, need for ileostomy, need for reoperative surgery, injury to intraabominal organs (such as intestine, spleen, kidney, bladder, ureter, etc.), ileus, irregular bowel habits. She seems to understand.  Avel Peace  Current Plans

## 2014-04-08 ENCOUNTER — Telehealth: Payer: Self-pay | Admitting: Internal Medicine

## 2014-04-08 ENCOUNTER — Ambulatory Visit: Payer: BC Managed Care – PPO | Admitting: Internal Medicine

## 2014-04-08 NOTE — Telephone Encounter (Signed)
I have reviewed Dr Maris Berger note. I will see Sophia Beard later this week and discuss surgery vs biologicals.

## 2014-04-08 NOTE — Telephone Encounter (Signed)
Spoke with patient and she states she saw the surgeon and was told that she was to have a colonoscopy prior to surgery and taper off Prednisone. She wants to know if this is what she needs to do. She also wants Dr. Juanda Chance to know she is not doing Humira per surgeon. Please, advise.

## 2014-04-08 NOTE — Telephone Encounter (Signed)
Spoke with patient and gave her Dr. Brodie's recommendation.  

## 2014-04-11 ENCOUNTER — Encounter: Payer: Self-pay | Admitting: Internal Medicine

## 2014-04-11 ENCOUNTER — Ambulatory Visit (INDEPENDENT_AMBULATORY_CARE_PROVIDER_SITE_OTHER): Payer: BC Managed Care – PPO | Admitting: Internal Medicine

## 2014-04-11 VITALS — BP 120/60 | HR 80 | Ht 62.0 in | Wt 110.0 lb

## 2014-04-11 DIAGNOSIS — R634 Abnormal weight loss: Secondary | ICD-10-CM

## 2014-04-11 DIAGNOSIS — K50018 Crohn's disease of small intestine with other complication: Secondary | ICD-10-CM

## 2014-04-11 DIAGNOSIS — K5660 Unspecified intestinal obstruction: Secondary | ICD-10-CM

## 2014-04-11 NOTE — Progress Notes (Signed)
Sophia Beard 02-10-1981 [id number]  Note: This dictation was prepared with Dragon digital system. Any transcriptional errors that result from this procedure are unintentional.   History of Present Illness:  This is a 33 year old white female with Crohn's  ileitis who came to discuss options for surgery versus medical treatment. She was recently hospitalized for small bowel obstruction  Still  having severe abdominal pain, tolerating only clear liquids. . She also is having nausea and continued weight loss. She is having 1-2 bowel movements a week. no diarrhea. The CT scan of the abdomen 2 weeks ago  showed a long 13 cm segment of terminal ileum with a question of abscess versus confined perforation. Similar abnormalities were seen on prior CT scans in 2006 and 2012.While in the hospital, she responded to intravenous steroids and Cipro and Flagyl, but relapsed when she got home.. She is currently on prednisone taper,  from 30 mg/day  to 10 mg a day. She was seen by Dr. Abbey Chattersosenbower last week  for consideration of terminal ileal resection and was asked to taper Prednisone before surgery.. Her last colonoscopy was in 2006.,Bx's revealed acute inflammation at the IC valve,  granuloma and crypt abscesses..  We have also discussed starting biologicals. Patient reports that she doesn't feel well and that she cannot eat. She wants to go ahead with surgery.    Past Medical History  Diagnosis Date  . Ovarian cyst   . Anemia   . Asthma   . Depression   . Crohn disease   . Anal fissure   . Anxiety   . GI bleed   . Raynaud's disease     Past Surgical History  Procedure Laterality Date  . Neg hx      Allergies  Allergen Reactions  . Iron Dextran   . Sulfonamide Derivatives     Family history and social history have been reviewed.  Review of Systems: positive for nausea, weight loss. Abdominal pain  The remainder of the 10 point ROS is negative except as outlined in the H&P  Physical  Exam: General Appearance thin, in mild distress Eyes  Non icteric  HEENT  Non traumatic, normocephalic  Mouth No lesion, tongue papillated, no cheilosis Neck Supple without adenopathy, thyroid not enlarged, no carotid bruits, no JVD Lungs Clear to auscultation bilaterally COR Normal S1, normal S2,rapid  regular rhythm, no murmur, quiet precordium Abdomen quiet bowel sounds. 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 This stricture has been present  for at least 10 years and I doubt that it will respond to steroids or biologics.I think we are dealing with a fibrotic stricture which needs to be resected. She is basically maintained only on liquids and continues to lose weight. She and I agree that we should go ahead and schedule surgery with Dr. Abbey Chattersosenbower. She will taper her prednisone to 5 mg a day starting tomorrow and for the next 4 days and will discontinue it prior to the surgery. She will likely need biologics after she recuperates from the surgery ,because she had microscopic colitis on last colonoscopy in 2006 . She will continue full liquids and Zofran for nausea. We will check her bmet and CBC  today to rule out dehydration.   [name] 04/11/2014

## 2014-04-11 NOTE — Patient Instructions (Addendum)
We have contacted Dr Maris Bergerosenbower's office to advise that surgery seems like a good option.  CC:Dr Martha ClanWilliam Shaw, Dr Abbey Chattersosenbower

## 2014-04-14 ENCOUNTER — Other Ambulatory Visit (INDEPENDENT_AMBULATORY_CARE_PROVIDER_SITE_OTHER): Payer: BC Managed Care – PPO

## 2014-04-14 DIAGNOSIS — R634 Abnormal weight loss: Secondary | ICD-10-CM

## 2014-04-14 DIAGNOSIS — K5660 Unspecified intestinal obstruction: Secondary | ICD-10-CM

## 2014-04-14 DIAGNOSIS — K50018 Crohn's disease of small intestine with other complication: Secondary | ICD-10-CM

## 2014-04-14 LAB — CBC WITH DIFFERENTIAL/PLATELET
Basophils Absolute: 0.1 10*3/uL (ref 0.0–0.1)
Basophils Relative: 0.6 % (ref 0.0–3.0)
Eosinophils Absolute: 0 10*3/uL (ref 0.0–0.7)
Eosinophils Relative: 0.4 % (ref 0.0–5.0)
HCT: 40.4 % (ref 36.0–46.0)
Hemoglobin: 12.5 g/dL (ref 12.0–15.0)
Lymphocytes Relative: 15.2 % (ref 12.0–46.0)
Lymphs Abs: 2 10*3/uL (ref 0.7–4.0)
MCHC: 30.8 g/dL (ref 30.0–36.0)
MCV: 72.1 fl — ABNORMAL LOW (ref 78.0–100.0)
Monocytes Absolute: 1 10*3/uL (ref 0.1–1.0)
Monocytes Relative: 7.9 % (ref 3.0–12.0)
Neutro Abs: 9.8 10*3/uL — ABNORMAL HIGH (ref 1.4–7.7)
Neutrophils Relative %: 75.9 % (ref 43.0–77.0)
Platelets: 541 10*3/uL — ABNORMAL HIGH (ref 150.0–400.0)
RBC: 5.61 Mil/uL — ABNORMAL HIGH (ref 3.87–5.11)
RDW: 16.8 % — ABNORMAL HIGH (ref 11.5–15.5)
WBC: 12.9 10*3/uL — ABNORMAL HIGH (ref 4.0–10.5)

## 2014-04-14 LAB — BASIC METABOLIC PANEL
BUN: 9 mg/dL (ref 6–23)
CO2: 22 mEq/L (ref 19–32)
Calcium: 9.3 mg/dL (ref 8.4–10.5)
Chloride: 104 mEq/L (ref 96–112)
Creatinine, Ser: 1.1 mg/dL (ref 0.4–1.2)
GFR: 62.77 mL/min (ref >60.00)
Glucose, Bld: 59 mg/dL — ABNORMAL LOW (ref 70–99)
Potassium: 4 mEq/L (ref 3.5–5.1)
Sodium: 140 mEq/L (ref 135–145)

## 2014-04-16 ENCOUNTER — Other Ambulatory Visit (HOSPITAL_COMMUNITY): Payer: Self-pay | Admitting: *Deleted

## 2014-04-16 NOTE — Patient Instructions (Addendum)
Sophia Beard  04/16/2014                           YOUR PROCEDURE IS SCHEDULED ON:  04/21/14                ENTER FROM [address] - ENTER THRU EMERGENCY ENTRANCE                                         FOLLOW  SIGNS TO SHORT STAY CENTER                 ARRIVE AT SHORT STAY AT: 5:30 AM               CALL THIS NUMBER IF ANY PROBLEMS THE DAY OF SURGERY :               [phone]                                REMEMBER:   Do not eat food or drink liquids AFTER MIDNIGHT                  Take these medicines the morning of surgery with               A SIPS OF WATER :  wellbutrin      Do not wear jewelry, make-up   Do not wear lotions, powders, or perfumes.   Do not shave legs or underarms 12 hrs. before surgery (men may shave face)  Do not bring valuables to the hospital.  Contacts, dentures or bridgework may not be worn into surgery.  Leave suitcase in the car. After surgery it may be brought to your room.  For patients admitted to the hospital more than one night, checkout time is            11:00 AM                                                        ________________________________________________________________________                                                                                                  [facility] - PREPARING FOR SURGERY  Before surgery, you can play an important role.  Because skin is not sterile, your skin needs to be as free of germs as possible.  You can reduce the number of germs on your skin by washing with CHG (chlorahexidine gluconate) soap before surgery.  CHG is an antiseptic cleaner which kills germs and bonds with the skin to continue killing germs even after washing. Please DO NOT use if you have an allergy to CHG or antibacterial soaps.  If your  skin becomes reddened/irritated stop using the CHG and inform your nurse when you arrive at Short Stay. Do not shave (including legs and underarms) for at least 48  hours prior to the first CHG shower.  You may shave your face. Please follow these instructions carefully:   1.  Shower with CHG Soap the night before surgery and the  morning of Surgery.   2.  If you choose to wash your hair, wash your hair first as usual with your  normal  Shampoo.   3.  After you shampoo, rinse your hair and body thoroughly to remove the  shampoo.                                         4.  Use CHG as you would any other liquid soap.  You can apply chg directly  to the skin and wash . Gently wash with scrungie or clean wascloth    5.  Apply the CHG Soap to your body ONLY FROM THE NECK DOWN.   Do not use on open                           Wound or open sores. Avoid contact with eyes, ears mouth and genitals (private parts).                        Genitals (private parts) with your normal soap.              6.  Wash thoroughly, paying special attention to the area where your surgery  will be performed.   7.  Thoroughly rinse your body with warm water from the neck down.   8.  DO NOT shower/wash with your normal soap after using and rinsing off  the CHG Soap .                9.  Pat yourself dry with a clean towel.             10.  Wear clean pajamas.             11.  Place clean sheets on your bed the night of your first shower and do not  sleep with pets.  Day of Surgery : Do not apply any lotions/deodorants the morning of surgery.  Please wear clean clothes to the hospital/surgery center.  FAILURE TO FOLLOW THESE INSTRUCTIONS MAY RESULT IN THE CANCELLATION OF YOUR SURGERY    PATIENT SIGNATURE_________________________________  ______________________________________________________________________

## 2014-04-16 NOTE — Progress Notes (Signed)
Please put orders in Epic surgery 04-21-14 pre op 04-17-14 Thanks

## 2014-04-17 ENCOUNTER — Encounter (INDEPENDENT_AMBULATORY_CARE_PROVIDER_SITE_OTHER): Payer: Self-pay | Admitting: General Surgery

## 2014-04-17 ENCOUNTER — Other Ambulatory Visit (INDEPENDENT_AMBULATORY_CARE_PROVIDER_SITE_OTHER): Payer: Self-pay | Admitting: General Surgery

## 2014-04-17 ENCOUNTER — Telehealth: Payer: Self-pay | Admitting: Internal Medicine

## 2014-04-17 ENCOUNTER — Encounter (HOSPITAL_COMMUNITY): Payer: Self-pay

## 2014-04-17 ENCOUNTER — Encounter (HOSPITAL_COMMUNITY)
Admission: RE | Admit: 2014-04-17 | Discharge: 2014-04-17 | Disposition: A | Discharge status: Home / Self Care | Payer: BC Managed Care – PPO | Source: Ambulatory Visit | Attending: General Surgery | Admitting: General Surgery

## 2014-04-17 DIAGNOSIS — Z01812 Encounter for preprocedural laboratory examination: Secondary | ICD-10-CM | POA: Diagnosis present

## 2014-04-17 HISTORY — DX: Family history of other specified conditions: Z84.89

## 2014-04-17 LAB — COMPREHENSIVE METABOLIC PANEL
ALT: 7 U/L (ref 0–35)
AST: 10 U/L (ref 0–37)
Albumin: 3.1 g/dL — ABNORMAL LOW (ref 3.5–5.2)
Alkaline Phosphatase: 90 U/L (ref 39–117)
Anion gap: 10 (ref 5–15)
BUN: 7 mg/dL (ref 6–23)
CO2: 27 mEq/L (ref 19–32)
Calcium: 9.4 mg/dL (ref 8.4–10.5)
Chloride: 97 mEq/L (ref 96–112)
Creatinine, Ser: 0.91 mg/dL (ref 0.50–1.10)
GFR calc Af Amer: >90 mL/min (ref >90)
GFR calc non Af Amer: 82 mL/min — ABNORMAL LOW (ref >90)
Glucose, Bld: 84 mg/dL (ref 70–99)
Potassium: 4.5 mEq/L (ref 3.7–5.3)
Sodium: 134 mEq/L — ABNORMAL LOW (ref 137–147)
Total Bilirubin: 0.3 mg/dL (ref 0.3–1.2)
Total Protein: 7.4 g/dL (ref 6.0–8.3)

## 2014-04-17 LAB — CBC
HCT: 37.8 % (ref 36.0–46.0)
Hemoglobin: 11.7 g/dL — ABNORMAL LOW (ref 12.0–15.0)
MCH: 22.3 pg — ABNORMAL LOW (ref 26.0–34.0)
MCHC: 31 g/dL (ref 30.0–36.0)
MCV: 72.1 fL — ABNORMAL LOW (ref 78.0–100.0)
Platelets: 519 10*3/uL — ABNORMAL HIGH (ref 150–400)
RBC: 5.24 MIL/uL — ABNORMAL HIGH (ref 3.87–5.11)
RDW: 15.8 % — ABNORMAL HIGH (ref 11.5–15.5)
WBC: 10.8 10*3/uL — ABNORMAL HIGH (ref 4.0–10.5)

## 2014-04-17 LAB — HCG, SERUM, QUALITATIVE: Preg, Serum: NEGATIVE

## 2014-04-17 NOTE — Progress Notes (Unsigned)
Patient ID: Sophia Beard, female   DOB: Dec 03, 1980, 33 y.o.   MRN: [id number] Dr. Juanda Chance does not feel she needs colonoscopy at this time.  Will schedule laparoscopic assisted ileocecectomy.

## 2014-04-17 NOTE — Telephone Encounter (Signed)
Spoke with Cyndra NumbersBernie and told her patient is not scheduled for colonoscopy that I can see and there is no mention in last dictation.

## 2014-04-21 ENCOUNTER — Inpatient Hospital Stay (HOSPITAL_COMMUNITY): Payer: BC Managed Care – PPO | Admitting: *Deleted

## 2014-04-21 ENCOUNTER — Inpatient Hospital Stay (HOSPITAL_COMMUNITY)
Admission: RE | Admit: 2014-04-21 | Discharge: 2014-04-24 | DRG: 330 | Disposition: A | Discharge status: Home / Self Care | Payer: BC Managed Care – PPO | Source: Ambulatory Visit | Attending: General Surgery | Admitting: General Surgery

## 2014-04-21 ENCOUNTER — Encounter (HOSPITAL_COMMUNITY)
Admission: RE | Disposition: A | Discharge status: Home / Self Care | Payer: Self-pay | Source: Ambulatory Visit | Attending: General Surgery

## 2014-04-21 ENCOUNTER — Encounter (HOSPITAL_COMMUNITY): Payer: Self-pay | Admitting: *Deleted

## 2014-04-21 DIAGNOSIS — K50012 Crohn's disease of small intestine with intestinal obstruction: Principal | ICD-10-CM | POA: Diagnosis present

## 2014-04-21 DIAGNOSIS — I7300 Raynaud's syndrome without gangrene: Secondary | ICD-10-CM | POA: Diagnosis present

## 2014-04-21 DIAGNOSIS — D62 Acute posthemorrhagic anemia: Secondary | ICD-10-CM | POA: Diagnosis present

## 2014-04-21 DIAGNOSIS — K5000 Crohn's disease of small intestine without complications: Secondary | ICD-10-CM | POA: Diagnosis present

## 2014-04-21 DIAGNOSIS — F418 Other specified anxiety disorders: Secondary | ICD-10-CM | POA: Diagnosis present

## 2014-04-21 HISTORY — PX: LAPAROSCOPIC ILEOCECECTOMY: SHX5898

## 2014-04-21 LAB — TYPE AND SCREEN
ABO/RH(D): O POS
Antibody Screen: NEGATIVE

## 2014-04-21 SURGERY — EXCISION, CECUM WITH ILEUM, LAPAROSCOPIC
Anesthesia: General | Site: Abdomen

## 2014-04-21 MED ORDER — PROPOFOL 10 MG/ML IV BOLUS
INTRAVENOUS | Status: AC
  Filled 2014-04-21: qty 20

## 2014-04-21 MED ORDER — CHLORHEXIDINE GLUCONATE CLOTH 2 % EX PADS: 6.0000 | MEDICATED_PAD | Freq: Once | CUTANEOUS | Status: DC

## 2014-04-21 MED ORDER — HYDROMORPHONE HCL 1 MG/ML IJ SOLN
INTRAMUSCULAR | Status: DC | PRN
  Administered 2014-04-21 (×4): 0.5 mg via INTRAVENOUS

## 2014-04-21 MED ORDER — BUPIVACAINE HCL (PF) 0.5 % IJ SOLN
INTRAMUSCULAR | Status: DC | PRN
  Administered 2014-04-21: 25 mL

## 2014-04-21 MED ORDER — ALVIMOPAN 12 MG PO CAPS
12.0000 mg | ORAL_CAPSULE | Freq: Two times a day (BID) | ORAL | Status: DC
  Administered 2014-04-22 – 2014-04-23 (×4): 12 mg via ORAL
  Filled 2014-04-21 (×7): qty 1

## 2014-04-21 MED ORDER — DIPHENHYDRAMINE HCL 50 MG/ML IJ SOLN
12.5000 mg | Freq: Four times a day (QID) | INTRAMUSCULAR | Status: DC | PRN
  Administered 2014-04-21 – 2014-04-23 (×2): 12.5 mg via INTRAVENOUS
  Filled 2014-04-21 (×2): qty 1

## 2014-04-21 MED ORDER — SUCCINYLCHOLINE CHLORIDE 20 MG/ML IJ SOLN
INTRAMUSCULAR | Status: DC | PRN
  Administered 2014-04-21: 100 mg via INTRAVENOUS

## 2014-04-21 MED ORDER — ONDANSETRON HCL 4 MG/2ML IJ SOLN
INTRAMUSCULAR | Status: DC | PRN
  Administered 2014-04-21 (×2): 4 mg via INTRAVENOUS

## 2014-04-21 MED ORDER — HYDROMORPHONE HCL 1 MG/ML IJ SOLN
INTRAMUSCULAR | Status: AC
  Filled 2014-04-21: qty 1

## 2014-04-21 MED ORDER — GLYCOPYRROLATE 0.2 MG/ML IJ SOLN
INTRAMUSCULAR | Status: AC
  Filled 2014-04-21: qty 2

## 2014-04-21 MED ORDER — ONDANSETRON HCL 4 MG PO TABS: 4.0000 mg | ORAL_TABLET | Freq: Four times a day (QID) | ORAL | Status: DC | PRN

## 2014-04-21 MED ORDER — DEXTROSE 5 % IV SOLN
2.0000 g | INTRAVENOUS | Status: AC
  Administered 2014-04-21: 2 g via INTRAVENOUS
  Filled 2014-04-21: qty 2

## 2014-04-21 MED ORDER — ROCURONIUM BROMIDE 100 MG/10ML IV SOLN
INTRAVENOUS | Status: AC
  Filled 2014-04-21: qty 1

## 2014-04-21 MED ORDER — NEOSTIGMINE METHYLSULFATE 10 MG/10ML IV SOLN
INTRAVENOUS | Status: DC | PRN
  Administered 2014-04-21: 3 mg via INTRAVENOUS

## 2014-04-21 MED ORDER — LIDOCAINE HCL (CARDIAC) 20 MG/ML IV SOLN
INTRAVENOUS | Status: AC
Start: 2014-04-21 — End: 2014-04-21
  Filled 2014-04-21: qty 5

## 2014-04-21 MED ORDER — MIDAZOLAM HCL 2 MG/2ML IJ SOLN
INTRAMUSCULAR | Status: AC
  Filled 2014-04-21: qty 2

## 2014-04-21 MED ORDER — ONDANSETRON HCL 4 MG/2ML IJ SOLN: 4.0000 mg | INTRAMUSCULAR | Status: DC | PRN

## 2014-04-21 MED ORDER — PANTOPRAZOLE SODIUM 40 MG IV SOLR
40.0000 mg | Freq: Every day | INTRAVENOUS | Status: DC
  Administered 2014-04-21 – 2014-04-24 (×4): 40 mg via INTRAVENOUS
  Filled 2014-04-21 (×4): qty 40

## 2014-04-21 MED ORDER — SODIUM CHLORIDE 0.9 % IJ SOLN
INTRAMUSCULAR | Status: AC
  Filled 2014-04-21: qty 20

## 2014-04-21 MED ORDER — NEOSTIGMINE METHYLSULFATE 10 MG/10ML IV SOLN
INTRAVENOUS | Status: AC
  Filled 2014-04-21: qty 1

## 2014-04-21 MED ORDER — NALOXONE HCL 0.4 MG/ML IJ SOLN
0.4000 mg | INTRAMUSCULAR | Status: DC | PRN
Start: 2014-04-21 — End: 2014-04-24

## 2014-04-21 MED ORDER — HYDROMORPHONE HCL 1 MG/ML IJ SOLN
0.2500 mg | INTRAMUSCULAR | Status: DC | PRN
  Administered 2014-04-21: 0.5 mg via INTRAVENOUS

## 2014-04-21 MED ORDER — PROMETHAZINE HCL 25 MG/ML IJ SOLN: 6.2500 mg | INTRAMUSCULAR | Status: DC | PRN

## 2014-04-21 MED ORDER — KCL-LACTATED RINGERS-D5W 20 MEQ/L IV SOLN
INTRAVENOUS | Status: DC
  Administered 2014-04-21 (×2): via INTRAVENOUS
  Administered 2014-04-22: 1000 mL via INTRAVENOUS
  Administered 2014-04-22: 02:00:00 via INTRAVENOUS
  Administered 2014-04-23: 1000 mL via INTRAVENOUS
  Administered 2014-04-24: 100 mL via INTRAVENOUS
  Filled 2014-04-21 (×9): qty 1000

## 2014-04-21 MED ORDER — GLYCOPYRROLATE 0.2 MG/ML IJ SOLN
INTRAMUSCULAR | Status: DC | PRN
  Administered 2014-04-21: 0.4 mg via INTRAVENOUS

## 2014-04-21 MED ORDER — PHENYLEPHRINE HCL 10 MG/ML IJ SOLN
INTRAMUSCULAR | Status: DC | PRN
  Administered 2014-04-21: 60 mg via INTRAVENOUS
  Administered 2014-04-21 (×2): 40 mg via INTRAVENOUS

## 2014-04-21 MED ORDER — ALVIMOPAN 12 MG PO CAPS
12.0000 mg | ORAL_CAPSULE | Freq: Once | ORAL | Status: AC
  Administered 2014-04-21: 12 mg via ORAL
  Filled 2014-04-21: qty 1

## 2014-04-21 MED ORDER — PHENYLEPHRINE 40 MCG/ML (10ML) SYRINGE FOR IV PUSH (FOR BLOOD PRESSURE SUPPORT)
PREFILLED_SYRINGE | INTRAVENOUS | Status: AC
  Filled 2014-04-21: qty 10

## 2014-04-21 MED ORDER — METOCLOPRAMIDE HCL 5 MG/ML IJ SOLN
INTRAMUSCULAR | Status: DC | PRN
  Administered 2014-04-21: 10 mg via INTRAVENOUS

## 2014-04-21 MED ORDER — LIP MEDEX EX OINT
TOPICAL_OINTMENT | CUTANEOUS | Status: AC
  Filled 2014-04-21: qty 7

## 2014-04-21 MED ORDER — ROCURONIUM BROMIDE 100 MG/10ML IV SOLN
INTRAVENOUS | Status: DC | PRN
  Administered 2014-04-21: 5 mg via INTRAVENOUS
  Administered 2014-04-21: 20 mg via INTRAVENOUS

## 2014-04-21 MED ORDER — SODIUM CHLORIDE 0.9 % IJ SOLN: 9.0000 mL | INTRAMUSCULAR | Status: DC | PRN

## 2014-04-21 MED ORDER — LACTATED RINGERS IV SOLN
INTRAVENOUS | Status: DC | PRN
  Administered 2014-04-21 (×2): via INTRAVENOUS
  Administered 2014-04-21: 1000 mL
  Administered 2014-04-21: 07:00:00 via INTRAVENOUS

## 2014-04-21 MED ORDER — BUPIVACAINE HCL (PF) 0.5 % IJ SOLN
INTRAMUSCULAR | Status: AC
  Filled 2014-04-21: qty 30

## 2014-04-21 MED ORDER — HEPARIN SODIUM (PORCINE) 5000 UNIT/ML IJ SOLN
5000.0000 [IU] | Freq: Three times a day (TID) | INTRAMUSCULAR | Status: DC
  Administered 2014-04-22 – 2014-04-24 (×7): 5000 [IU] via SUBCUTANEOUS
  Filled 2014-04-21 (×10): qty 1

## 2014-04-21 MED ORDER — ONDANSETRON HCL 4 MG/2ML IJ SOLN
INTRAMUSCULAR | Status: AC
  Filled 2014-04-21: qty 2

## 2014-04-21 MED ORDER — DEXAMETHASONE SODIUM PHOSPHATE 10 MG/ML IJ SOLN
INTRAMUSCULAR | Status: DC | PRN
  Administered 2014-04-21: 10 mg via INTRAVENOUS

## 2014-04-21 MED ORDER — LACTATED RINGERS IR SOLN
Status: DC | PRN
  Administered 2014-04-21: 1000 mL

## 2014-04-21 MED ORDER — 0.9 % SODIUM CHLORIDE (POUR BTL) OPTIME
TOPICAL | Status: DC | PRN
  Administered 2014-04-21: 4000 mL

## 2014-04-21 MED ORDER — MIDAZOLAM HCL 5 MG/5ML IJ SOLN
INTRAMUSCULAR | Status: DC | PRN
  Administered 2014-04-21: 1.5 mg via INTRAVENOUS
  Administered 2014-04-21: 0.5 mg via INTRAVENOUS

## 2014-04-21 MED ORDER — SUFENTANIL CITRATE 50 MCG/ML IV SOLN
INTRAVENOUS | Status: AC
  Filled 2014-04-21: qty 1

## 2014-04-21 MED ORDER — CEFOTETAN DISODIUM-DEXTROSE 2-2.08 GM-% IV SOLR
INTRAVENOUS | Status: AC
  Filled 2014-04-21: qty 50

## 2014-04-21 MED ORDER — ACETAMINOPHEN 10 MG/ML IV SOLN
1000.0000 mg | Freq: Once | INTRAVENOUS | Status: AC
  Administered 2014-04-21: 1000 mg via INTRAVENOUS
  Filled 2014-04-21: qty 100

## 2014-04-21 MED ORDER — MORPHINE SULFATE (PF) 1 MG/ML IV SOLN
INTRAVENOUS | Status: AC
  Administered 2014-04-21: 1 mg via INTRAVENOUS
  Filled 2014-04-21: qty 25

## 2014-04-21 MED ORDER — HYDROMORPHONE HCL 2 MG/ML IJ SOLN
INTRAMUSCULAR | Status: AC
Start: 2014-04-21 — End: 2014-04-21
  Filled 2014-04-21: qty 1

## 2014-04-21 MED ORDER — PROPOFOL 10 MG/ML IV BOLUS
INTRAVENOUS | Status: DC | PRN
  Administered 2014-04-21: 1160 mg via INTRAVENOUS

## 2014-04-21 MED ORDER — DEXTROSE 5 % IV SOLN
2.0000 g | Freq: Two times a day (BID) | INTRAVENOUS | Status: AC
  Administered 2014-04-21: 2 g via INTRAVENOUS
  Filled 2014-04-21: qty 2

## 2014-04-21 MED ORDER — ONDANSETRON HCL 4 MG/2ML IJ SOLN: 4.0000 mg | Freq: Four times a day (QID) | INTRAMUSCULAR | Status: DC | PRN

## 2014-04-21 MED ORDER — MORPHINE SULFATE (PF) 1 MG/ML IV SOLN
INTRAVENOUS | Status: DC
Start: 2014-04-21 — End: 2014-04-24
  Administered 2014-04-21: 1 mg via INTRAVENOUS
  Administered 2014-04-21: 3.3 mg via INTRAVENOUS
  Administered 2014-04-21: 6 mg via INTRAVENOUS
  Administered 2014-04-22 (×2): 5 mg via INTRAVENOUS
  Administered 2014-04-22: 8 mg via INTRAVENOUS
  Administered 2014-04-22: 6 mg via INTRAVENOUS
  Administered 2014-04-22: 4 mg via INTRAVENOUS
  Administered 2014-04-23: 3 mL via INTRAVENOUS
  Administered 2014-04-23: 4 mg via INTRAVENOUS
  Administered 2014-04-23: 25 mL via INTRAVENOUS
  Filled 2014-04-21 (×2): qty 25

## 2014-04-21 MED ORDER — DIPHENHYDRAMINE HCL 12.5 MG/5ML PO ELIX: 12.5000 mg | ORAL_SOLUTION | Freq: Four times a day (QID) | ORAL | Status: DC | PRN

## 2014-04-21 MED ORDER — SUFENTANIL CITRATE 50 MCG/ML IV SOLN
INTRAVENOUS | Status: DC | PRN
  Administered 2014-04-21 (×2): 5 ug via INTRAVENOUS
  Administered 2014-04-21: 10 ug via INTRAVENOUS
  Administered 2014-04-21: 5 ug via INTRAVENOUS

## 2014-04-21 SURGICAL SUPPLY — 91 items
APL SKNCLS STERI-STRIP NONHPOA (GAUZE/BANDAGES/DRESSINGS) ×1
APPLIER CLIP 5 13 M/L LIGAMAX5 (MISCELLANEOUS)
APPLIER CLIP ROT 10 11.4 M/L (STAPLE)
APR CLP MED LRG 11.4X10 (STAPLE)
APR CLP MED LRG 5 ANG JAW (MISCELLANEOUS)
BENZOIN TINCTURE PRP APPL 2/3 (GAUZE/BANDAGES/DRESSINGS) ×1 IMPLANT
BLADE EXTENDED COATED 6.5IN (ELECTRODE) IMPLANT
BLADE HEX COATED 2.75 (ELECTRODE) ×3 IMPLANT
BLADE SURG SZ10 CARB STEEL (BLADE) ×2 IMPLANT
CABLE HIGH FREQUENCY MONO STRZ (ELECTRODE) ×1 IMPLANT
CANISTER SUCTION 2500CC (MISCELLANEOUS) ×1 IMPLANT
CELLS DAT CNTRL 66122 CELL SVR (MISCELLANEOUS) ×1 IMPLANT
CLIP APPLIE 5 13 M/L LIGAMAX5 (MISCELLANEOUS) IMPLANT
CLIP APPLIE ROT 10 11.4 M/L (STAPLE) IMPLANT
COVER MAYO STAND STRL (DRAPES) ×2 IMPLANT
DECANTER SPIKE VIAL GLASS SM (MISCELLANEOUS) IMPLANT
DISSECTOR BLUNT TIP ENDO 5MM (MISCELLANEOUS) IMPLANT
DRAPE LAPAROSCOPIC ABDOMINAL (DRAPES) ×2 IMPLANT
DRAPE LG THREE QUARTER DISP (DRAPES) ×1 IMPLANT
DRAPE SHEET LG 3/4 BI-LAMINATE (DRAPES) IMPLANT
DRAPE UTILITY XL STRL (DRAPES) ×2 IMPLANT
DRAPE WARM FLUID 44X44 (DRAPE) ×2 IMPLANT
DRSG OPSITE POSTOP 4X6 (GAUZE/BANDAGES/DRESSINGS) ×1 IMPLANT
ELECT REM PT RETURN 9FT ADLT (ELECTROSURGICAL) ×2
ELECTRODE REM PT RTRN 9FT ADLT (ELECTROSURGICAL) ×1 IMPLANT
FILTER SMOKE EVAC LAPAROSHD (FILTER) ×1 IMPLANT
GAUZE SPONGE 2X2 8PLY STRL LF (GAUZE/BANDAGES/DRESSINGS) IMPLANT
GAUZE SPONGE 4X4 12PLY STRL (GAUZE/BANDAGES/DRESSINGS) ×2 IMPLANT
GLOVE BIO SURGEON STRL SZ8 (GLOVE) ×1 IMPLANT
GLOVE BIOGEL M STRL SZ7.5 (GLOVE) ×1 IMPLANT
GLOVE BIOGEL PI IND STRL 6.5 (GLOVE) IMPLANT
GLOVE BIOGEL PI IND STRL 7.0 (GLOVE) ×1 IMPLANT
GLOVE BIOGEL PI IND STRL 8 (GLOVE) IMPLANT
GLOVE BIOGEL PI INDICATOR 6.5 (GLOVE) ×3
GLOVE BIOGEL PI INDICATOR 7.0 (GLOVE) ×1
GLOVE BIOGEL PI INDICATOR 8 (GLOVE) ×3
GLOVE ECLIPSE 8.0 STRL XLNG CF (GLOVE) ×4 IMPLANT
GLOVE INDICATOR 8.0 STRL GRN (GLOVE) ×4 IMPLANT
GOWN STRL REUS W/TWL LRG LVL3 (GOWN DISPOSABLE) ×2 IMPLANT
GOWN STRL REUS W/TWL XL LVL3 (GOWN DISPOSABLE) ×4 IMPLANT
HOLDER FOLEY CATH W/STRAP (MISCELLANEOUS) ×1 IMPLANT
KIT BASIN OR (CUSTOM PROCEDURE TRAY) ×2 IMPLANT
LEGGING LITHOTOMY PAIR STRL (DRAPES) IMPLANT
LIGASURE IMPACT 36 18CM CVD LR (INSTRUMENTS) ×1 IMPLANT
NS IRRIG 1000ML POUR BTL (IV SOLUTION) ×4 IMPLANT
PENCIL BUTTON HOLSTER BLD 10FT (ELECTRODE) ×4 IMPLANT
RELOAD PROXIMATE 75MM BLUE (ENDOMECHANICALS) ×4 IMPLANT
RELOAD STAPLE 75 3.8 BLU REG (ENDOMECHANICALS) IMPLANT
RETRACTOR WND ALEXIS 18 MED (MISCELLANEOUS) IMPLANT
RTRCTR WOUND ALEXIS 18CM MED (MISCELLANEOUS) ×2
SCISSORS LAP 5X35 DISP (ENDOMECHANICALS) ×2 IMPLANT
SET IRRIG TUBING LAPAROSCOPIC (IRRIGATION / IRRIGATOR) ×1 IMPLANT
SHEARS HARMONIC ACE PLUS 36CM (ENDOMECHANICALS) IMPLANT
SLEEVE XCEL OPT CAN 5 100 (ENDOMECHANICALS) IMPLANT
SOLUTION ANTI FOG 6CC (MISCELLANEOUS) ×2 IMPLANT
SPONGE GAUZE 2X2 STER 10/PKG (GAUZE/BANDAGES/DRESSINGS) ×1
SPONGE LAP 18X18 X RAY DECT (DISPOSABLE) ×4 IMPLANT
STAPLER GUN LINEAR PROX 60 (STAPLE) ×1 IMPLANT
STAPLER PROXIMATE 75MM BLUE (STAPLE) ×1 IMPLANT
STAPLER VISISTAT 35W (STAPLE) ×2 IMPLANT
STRIP CLOSURE SKIN 1/2X4 (GAUZE/BANDAGES/DRESSINGS) ×1 IMPLANT
SUCTION POOLE TIP (SUCTIONS) ×2 IMPLANT
SUT MNCRL AB 4-0 PS2 18 (SUTURE) ×2 IMPLANT
SUT PDS AB 1 CTX 36 (SUTURE) ×2 IMPLANT
SUT PDS AB 1 TP1 96 (SUTURE) IMPLANT
SUT PROLENE 2 0 SH DA (SUTURE) IMPLANT
SUT SILK 2 0 (SUTURE) ×2
SUT SILK 2 0 SH CR/8 (SUTURE) ×2 IMPLANT
SUT SILK 2-0 18XBRD TIE 12 (SUTURE) ×1 IMPLANT
SUT SILK 3 0 (SUTURE) ×2
SUT SILK 3 0 SH CR/8 (SUTURE) ×3 IMPLANT
SUT SILK 3-0 18XBRD TIE 12 (SUTURE) ×1 IMPLANT
SUT VIC AB 3-0 SH 27 (SUTURE) ×4
SUT VIC AB 3-0 SH 27X BRD (SUTURE) ×2 IMPLANT
SUT VICRYL 2 0 18  UND BR (SUTURE) ×1
SUT VICRYL 2 0 18 UND BR (SUTURE) ×1 IMPLANT
SYS LAPSCP GELPORT 120MM (MISCELLANEOUS)
SYSTEM LAPSCP GELPORT 120MM (MISCELLANEOUS) IMPLANT
TAPE CLOTH SURG 4X10 WHT LF (GAUZE/BANDAGES/DRESSINGS) ×1 IMPLANT
TOWEL OR 17X26 10 PK STRL BLUE (TOWEL DISPOSABLE) ×2 IMPLANT
TRAY FOLEY CATH 14FRSI W/METER (CATHETERS) ×2 IMPLANT
TRAY LAPAROSCOPIC (CUSTOM PROCEDURE TRAY) ×2 IMPLANT
TROCAR BLADELESS OPT 5 100 (ENDOMECHANICALS) IMPLANT
TROCAR BLADELESS OPT 5 75 (ENDOMECHANICALS) ×4 IMPLANT
TROCAR XCEL BLUNT TIP 100MML (ENDOMECHANICALS) IMPLANT
TROCAR XCEL NON-BLD 11X100MML (ENDOMECHANICALS) IMPLANT
TROCAR XCEL UNIV SLVE 11M 100M (ENDOMECHANICALS) IMPLANT
TUBING CONNECTING 10 (TUBING) ×2 IMPLANT
TUBING INSUFFLATION 10FT LAP (TUBING) IMPLANT
YANKAUER SUCT BULB TIP 10FT TU (MISCELLANEOUS) ×2 IMPLANT
YANKAUER SUCT BULB TIP NO VENT (SUCTIONS) ×3 IMPLANT

## 2014-04-21 NOTE — Transfer of Care (Signed)
Immediate Anesthesia Transfer of Care Note  Patient: Sophia Beard  Procedure(s) Performed: Procedure(s): LAPAROSCOPIC ASSISTED ILEOCYSTECTOMY (N/A)  Patient Location: PACU  Anesthesia Type:General  Level of Consciousness: awake, alert , oriented and patient cooperative  Airway & Oxygen Therapy: Patient Spontanous Breathing and Patient connected to face mask oxygen  Post-op Assessment: Report given to PACU RN, Post -op Vital signs reviewed and stable and Patient moving all extremities  Post vital signs: Reviewed and stable  Complications: No apparent anesthesia complications

## 2014-04-21 NOTE — Interval H&P Note (Signed)
History and Physical Interval Note:  04/21/2014 7:26 AM  Toney RakesAshley C Wallner  has presented today for surgery, with the diagnosis of Crohns disease  The various methods of treatment have been discussed with the patient and family.  Dr. Juanda ChanceBrodie decided that she did need to proceed with preop colonoscopy. After consideration of risks, benefits and other options for treatment, the patient has consented to  Procedure(s): LAPAROSCOPIC ASSISTED ILEOCYSTECTOMY (N/A) as a surgical intervention .  The patient's history has been reviewed, patient examined, no change in status, stable for surgery.  I have reviewed the patient's chart and labs.  Questions were answered to the patient's satisfaction.     Kennley Schwandt JShela Commons

## 2014-04-21 NOTE — Anesthesia Postprocedure Evaluation (Signed)
  Anesthesia Post-op Note  Patient: Sophia Beard  Procedure(s) Performed: Procedure(s) (LRB): LAPAROSCOPIC ASSISTED ILEOCYSTECTOMY (N/A)  Patient Location: PACU  Anesthesia Type: General  Level of Consciousness: awake and alert   Airway and Oxygen Therapy: Patient Spontanous Breathing  Post-op Pain: mild  Post-op Assessment: Post-op Vital signs reviewed, Patient's Cardiovascular Status Stable, Respiratory Function Stable, Patent Airway and No signs of Nausea or vomiting  Last Vitals:  Filed Vitals:   04/21/14 1030  BP: 130/83  Pulse: 90  Temp:   Resp: 13    Post-op Vital Signs: stable   Complications: No apparent anesthesia complications

## 2014-04-21 NOTE — H&P (View-Only) (Signed)
Patient ID: Sophia Beard, female   DOB: 09/19/1980, 32 y.o.   MRN: [id number]  Wiley C. Bandel 04/07/2014 2:52 PM Location: [facility] Patient #: [id number] DOB: 08/16/1980 Married / Language: English / Race: White Female History of Present Illness (Charlie Char J. Emil Klassen MD; 04/07/2014 3:27 PM) Patient words: ileum resection.  The patient is a 32 year old female    Note:She is referred by Dr. Brodie because of Crohn's disease or the terminal ileum (approximately 15 cm segment) and increasingly symptomatic partial obstruction symptoms. She was diagnosed with Crohn's disease approximately 10 years ago. She is been able to control the intermittent flareups with steroids. However, steroids are not helping as they have before and the pain and partial obstruction symptoms have persisted. She has a significant wall thickening and narrowing of the terminal ileum on CT scan. Last CT scan was done approximately 2 weeks ago. She did have evidence of a microperforation which was treated adequately with steroids and antibiotics prior to that CT. The recent CT showed resolution of that. She has been losing some weight. She gets sharp pain in the right lower quadrant area after eating solid food. She has less pain after taking in liquids. She is being considered for treatment with Biologics. Dr. Brodie also wants to perform a colonoscopy on her prior to surgery. She does have some diarrhea.  Other Problems (Meilani Edmundson J Kerissa Coia, MD; 04/07/2014 3:00 PM) CROHN'S DISEASE OF ILEUM WITHOUT COMPLICATION (555.0  K50.00)  Past Surgical History (Sonya Bynum, CMA; 04/07/2014 2:53 PM) No pertinent past surgical history  Allergies (Sonya Bynum, CMA; 04/07/2014 2:53 PM) Iron *HEMATOPOIETIC AGENTS* Sulfa Antibiotics  Medication History (Sonya Bynum, CMA; 04/07/2014 2:53 PM) BuPROPion HCl ER (XL) (150MG Tablet ER 24HR, Oral) Active. PredniSONE (10MG Tablet, Oral) Active.  Social History (Sonya  Bynum, CMA; 04/07/2014 2:53 PM) Caffeine use Carbonated beverages. Tobacco use Never smoker.  Family History (Sonya Bynum, CMA; 04/07/2014 2:53 PM) Hypertension Mother.     Review of Systems (Sonya Bynum CMA; 04/07/2014 2:53 PM) Gastrointestinal Present- Change in Bowel Habits. Not Present- Abdominal Pain, Bloating, Bloody Stool, Chronic diarrhea, Constipation, Difficulty Swallowing, Excessive gas, Gets full quickly at meals, Hemorrhoids, Indigestion, Nausea, Rectal Pain and Vomiting.  Vitals (Sonya Bynum CMA; 04/07/2014 2:55 PM) 04/07/2014 2:54 PM Weight: 112 lb Height: 62in Body Surface Area: 1.49 m Body Mass Index: 20.48 kg/m Temp.: 97F(Temporal)  Pulse: 79 (Regular)  BP: 114/70 (Sitting, Left Arm, Standard)     Assessment & Plan (Kao Conry J. Jaymar Loeber MD; 04/07/2014 3:30 PM)  CROHN'S DISEASE OF ILEUM WITHOUT COMPLICATION (555.0  K50.00) Impression: Her partial obstructive symptoms of been fairly persistent despite increasing doses of steroids. She is being considered for biologics. Also, needs a colonoscopy prior to surgery. We did discuss laparoscopic ileocecectomy.  She would need a 2 day bowel prep.  Plan: She is going to see Dr. Brodie this Friday. If she fails all medical treatment, we can proceed with scheduling the surgery. I have explained the procedure and risks of intestinal resection. Risks include but are not limited to bleeding, infection, wound problems, anesthesia, anastomotic leak, need for ileostomy, need for reoperative surgery, injury to intraabominal organs (such as intestine, spleen, kidney, bladder, ureter, etc.), ileus, irregular bowel habits. She seems to understand.  Kito Cuffe  Current Plans 

## 2014-04-21 NOTE — Anesthesia Preprocedure Evaluation (Signed)
Anesthesia Evaluation  Patient identified by MRN, date of birth, ID band Patient awake    Reviewed: Allergy & Precautions, H&P , NPO status , Patient's Chart, lab work & pertinent test results  Airway Mallampati: II  TM Distance: >3 FB Neck ROM: Full    Dental no notable dental hx.    Pulmonary neg pulmonary ROS,  breath sounds clear to auscultation  Pulmonary exam normal       Cardiovascular Rhythm:Regular Rate:Normal     Neuro/Psych negative neurological ROS  negative psych ROS   GI/Hepatic negative GI ROS, Neg liver ROS,   Endo/Other  negative endocrine ROS  Renal/GU negative Renal ROS  negative genitourinary   Musculoskeletal negative musculoskeletal ROS (+)   Abdominal   Peds negative pediatric ROS (+)  Hematology  (+) anemia ,   Anesthesia Other Findings   Reproductive/Obstetrics negative OB ROS                             Anesthesia Physical Anesthesia Plan  ASA: III  Anesthesia Plan: General   Post-op Pain Management:    Induction: Intravenous  Airway Management Planned: Oral ETT  Additional Equipment:   Intra-op Plan:   Post-operative Plan: Extubation in OR  Informed Consent: I have reviewed the patients History and Physical, chart, labs and discussed the procedure including the risks, benefits and alternatives for the proposed anesthesia with the patient or authorized representative who has indicated his/her understanding and acceptance.   Dental advisory given  Plan Discussed with: CRNA and Surgeon  Anesthesia Plan Comments:         Anesthesia Quick Evaluation

## 2014-04-21 NOTE — Op Note (Signed)
Operative Note  Sophia Beard female 33 y.o. 04/21/2014  PREOPERATIVE DX:  Crohns disease of terminal ileum with chronic partial SBO  POSTOPERATIVE DX:  Same  PROCEDURE:   Laparoscopic-assisted ileocecectomy         Surgeon: Adolph Pollack   Assistants: Gaynelle Adu, M.D.  Anesthesia: General endotracheal anesthesia  Indications:   This is a 33 year old female with Crohn's disease has had progressive involvement of the terminal ileum leading to chronic partial small bowel obstruction. She now presents for the above procedure.    Procedure Detail:  She was brought to the operating room placed supine on the operating table and a general anesthetic was given. A Foley catheter and oral gastric tube were inserted. The abdominal wall was sterilely prepped and draped.  She was placed in slight reverse Trendelenburg position. A 5 mm incision was made in the left subcostal area. Using a 5 mm laparoscope and Optiview trocar access was gained to the peritoneal cavity and a pneumoperitoneum was created. Inspection of the area underneath the trocar demonstrated no evidence of organ injury or bleeding. A 5 mm trocar was placed in the left lower quadrant. A 5 mm trocar was placed in the suprapubic area. I identified the ileocecal valve and noted thickened abnormal-appearing bile. I traced small bowel back which was very mobile and solid area of obvious Crohn's disease with creeping fat and thickening. I then mobilized the cecum and right colon including part of the hepatic flexure by dividing the peritoneal attachments. Following this, a small incision was made in the right lower quadrant through the skin, subcutaneous tissue, fascia, and peritoneum. A wound protection device was placed. The terminal ileum and proximal ascending colon were exteriorized. Normal small intestine proximal to the area of disease was divided with the GIA stapler. I divided the proximal ascending colon just beyond the cecum  with the GIA stapler. The mesentery was divided close to the intestine with the LigaSure device. The specimen was handed off the field. This was approximately a 20-25 cm segment of small intestine.  I stapled side-to-side anastomosis was then performed between the distal ileum and mid right colon area using the linear cutting stapler. No bleeding from the staple line was noted. The common defect was closed with linear noncutting stapler. This was then imbricated using 3-0 silk sutures in a Lembert fashion. The mesentery was closed with running 3-0 Vicryl suture. A crotch stitch of 3-0 silk was placed. The anastomosis was patent, viable, and under no tension.  The wound was copiously irrigated and there is no evidence of an anastomotic leak or bleeding. The fascia of this incision was closed in 2 layers with running #1 PDS suture. Repeat laparoscopy was performed and the fascial closure was solid. 4 quadrant and central inspection demonstrated no evidence of bleeding or organ injury. The pneumoperitoneum was released. The trocars were removed.  A right lower quadrant skin incision was closed with a combination of staples and Steri-Strips. The 3 trocar site incisions were closed with 4-0 Monocryl subcuticular stitches followed by Steri-Strips. Sterile dressings were applied to all wounds.  She tolerated the procedure well without any apparent complications and was taken to the recovery room in satisfactory condition.   Estimated Blood Loss:  200 mL         Specimens: terminal ileum and cecum.        Complications:  * No complications entered in OR log *         Disposition: PACU - hemodynamically  stable.         Condition: stable

## 2014-04-21 NOTE — Plan of Care (Signed)
Problem: Phase I Progression Outcomes Goal: Pain controlled with appropriate interventions Outcome: Progressing Goal: OOB as tolerated unless otherwise ordered Outcome: Progressing     

## 2014-04-22 ENCOUNTER — Encounter (HOSPITAL_COMMUNITY): Payer: Self-pay | Admitting: General Surgery

## 2014-04-22 LAB — CBC
HCT: 29.6 % — ABNORMAL LOW (ref 36.0–46.0)
Hemoglobin: 9.3 g/dL — ABNORMAL LOW (ref 12.0–15.0)
MCH: 22.7 pg — ABNORMAL LOW (ref 26.0–34.0)
MCHC: 31.4 g/dL (ref 30.0–36.0)
MCV: 72.4 fL — ABNORMAL LOW (ref 78.0–100.0)
Platelets: 475 10*3/uL — ABNORMAL HIGH (ref 150–400)
RBC: 4.09 MIL/uL (ref 3.87–5.11)
RDW: 16.3 % — ABNORMAL HIGH (ref 11.5–15.5)
WBC: 8.9 10*3/uL (ref 4.0–10.5)

## 2014-04-22 LAB — BASIC METABOLIC PANEL
Anion gap: 7 (ref 5–15)
BUN: <3 mg/dL — ABNORMAL LOW (ref 6–23)
CO2: 27 mEq/L (ref 19–32)
Calcium: 8.7 mg/dL (ref 8.4–10.5)
Chloride: 103 mEq/L (ref 96–112)
Creatinine, Ser: 0.84 mg/dL (ref 0.50–1.10)
GFR calc Af Amer: >90 mL/min (ref >90)
GFR calc non Af Amer: >90 mL/min (ref >90)
Glucose, Bld: 122 mg/dL — ABNORMAL HIGH (ref 70–99)
Potassium: 4.3 mEq/L (ref 3.7–5.3)
Sodium: 137 mEq/L (ref 137–147)

## 2014-04-22 NOTE — Progress Notes (Signed)
1 Day Post-Op  Subjective: RLQ soreness.  No nausea.  Objective: Vital signs in last 24 hours: Temp:  [96 F (35.6 C)-98.3 F (36.8 C)] 98 F (36.7 C) (11/24 0600) Pulse Rate:  [77-102] 81 (11/24 0600) Resp:  [11-19] 15 (11/24 0617) BP: (113-143)/(72-93) 130/93 mmHg (11/24 0600) SpO2:  [98 %-100 %] 99 % (11/24 0617) Last BM Date: 04/21/14  Intake/Output from previous day: 11/23 0701 - 11/24 0700 In: 5345.8 [I.V.:5345.8] Out: 1140 [Urine:1100; Blood:40] Intake/Output this shift:    PE: General- In NAD Abdomen-soft, dressings dry, few bowel sounds  Lab Results:   Recent Labs  04/22/14 0420  WBC 8.9  HGB 9.3*  HCT 29.6*  PLT 475*   BMET  Recent Labs  04/22/14 0420  NA 137  K 4.3  CL 103  CO2 27  GLUCOSE 122*  BUN <3*  CREATININE 0.84  CALCIUM 8.7   PT/INR No results for input(s): LABPROT, INR in the last 72 hours. Comprehensive Metabolic Panel:    Component Value Date/Time   NA 137 04/22/2014 0420   NA 134* 04/17/2014 0930   K 4.3 04/22/2014 0420   K 4.5 04/17/2014 0930   CL 103 04/22/2014 0420   CL 97 04/17/2014 0930   CO2 27 04/22/2014 0420   CO2 27 04/17/2014 0930   BUN <3* 04/22/2014 0420   BUN 7 04/17/2014 0930   CREATININE 0.84 04/22/2014 0420   CREATININE 0.91 04/17/2014 0930   GLUCOSE 122* 04/22/2014 0420   GLUCOSE 84 04/17/2014 0930   CALCIUM 8.7 04/22/2014 0420   CALCIUM 9.4 04/17/2014 0930   AST 10 04/17/2014 0930   AST 8 03/21/2014 1721   ALT 7 04/17/2014 0930   ALT <5 03/21/2014 1721   ALKPHOS 90 04/17/2014 0930   ALKPHOS 76 03/21/2014 1721   BILITOT 0.3 04/17/2014 0930   BILITOT 0.4 03/21/2014 1721   PROT 7.4 04/17/2014 0930   PROT 7.9 03/21/2014 1721   ALBUMIN 3.1* 04/17/2014 0930   ALBUMIN 3.9 03/21/2014 1721     Studies/Results: No results found.  Anti-infectives: Anti-infectives    Start     Dose/Rate Route Frequency Ordered Stop   04/21/14 2000  cefoTEtan (CEFOTAN) 2 g in dextrose 5 % 50 mL IVPB     2 g100  mL/hr over 30 Minutes Intravenous Every 12 hours 04/21/14 1122 04/21/14 1954   04/21/14 0610  cefoTEtan (CEFOTAN) 2 g in dextrose 5 % 50 mL IVPB     2 g100 mL/hr over 30 Minutes Intravenous On call to O.R. 04/21/14 0610 04/21/14 0742      Assessment Principal Problem:   Crohn's disease involving terminal ileum s/p lap ileocecectomy 04/21/14-stable overnight   ABL on chronic anemia    LOS: 1 day   Plan: Clear liquid diet.  Decrease IVF.   Layonna Dobie J 04/22/2014

## 2014-04-22 NOTE — Clinical Documentation Improvement (Signed)
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 ____________________  ____________________ ____________________ _______Other Condition__________________ _______Cannot Clinically Determine   Supporting Information: Risk Factors: Crohn's with partial SBO, Laparoscopic-assisted ileocecectomy   Signs & Symptoms: Pain  Diagnostics: Component Hep B S Ab  Latest Ref Rng NEGATIVE  03/21/2014 POSITIVE (A)   Treatment Morphine Dilaudid   Thank You, Enis Slipper ,RN Clinical Documentation Specialist:  [phone]  [facility]- Health Information Management

## 2014-04-23 NOTE — Plan of Care (Signed)
Problem: Phase II Progression Outcomes Goal: Surgical site without signs of infection Outcome: Completed/Met Date Met:  04/23/14

## 2014-04-23 NOTE — Plan of Care (Signed)
Problem: Phase II Progression Outcomes Goal: Tolerating diet Outcome: Completed/Met Date Met:  04/23/14     

## 2014-04-23 NOTE — Plan of Care (Signed)
Problem: Phase I Progression Outcomes Goal: Incision/dressings dry and intact Outcome: Completed/Met Date Met:  04/23/14

## 2014-04-23 NOTE — Progress Notes (Signed)
2 Days Post-Op  Subjective: Not much flatus, not allot in the way of flatus.  I took down one of the port sites and she had allot of discomfort, it looks fine, steri strip under dressing, I told her she could pick of the rest today.  Leave waffle dressing in place.  Objective: Vital signs in last 24 hours: Temp:  [97.6 F (36.4 C)-98.7 F (37.1 C)] 97.7 F (36.5 C) (11/25 0600) Pulse Rate:  [68-93] 72 (11/25 0600) Resp:  [13-18] 14 (11/25 0754) BP: (114-134)/(76-94) 122/88 mmHg (11/25 0600) SpO2:  [97 %-100 %] 100 % (11/25 0754) Last BM Date: 04/20/14 720 PO- clear liquids Afebrile, VSS Labs OK, H/H is down from 11/19  Intake/Output from previous day: 11/24 0701 - 11/25 0700 In: 3168.3 [P.O.:720; I.V.:2448.3] Out: 2075 [Urine:2075] Intake/Output this shift: Total I/O In: -  Out: 300 [Urine:300]  General appearance: alert, cooperative and no distress Resp: clear to auscultation bilaterally GI: soft, rare BS, no distension, not much in the way of flatus.  sites look fine.    Lab Results:   Recent Labs  04/22/14 0420  WBC 8.9  HGB 9.3*  HCT 29.6*  PLT 475*    BMET  Recent Labs  04/22/14 0420  NA 137  K 4.3  CL 103  CO2 27  GLUCOSE 122*  BUN <3*  CREATININE 0.84  CALCIUM 8.7   PT/INR No results for input(s): LABPROT, INR in the last 72 hours.   Recent Labs Lab 04/17/14 0930  AST 10  ALT 7  ALKPHOS 90  BILITOT 0.3  PROT 7.4  ALBUMIN 3.1*     Lipase     Component Value Date/Time   LIPASE 29 11/01/2010 0554     Studies/Results: No results found.  Medications: . alvimopan  12 mg Oral BID  . heparin subcutaneous  5,000 Units Subcutaneous 3 times per day  . morphine   Intravenous 6 times per day  . pantoprazole (PROTONIX) IV  40 mg Intravenous Daily   . dextrose 5% lactated ringers with KCl 20 mEq/L 1,000 mL (04/23/14 0708)   Prior to Admission medications   Medication Sig Start Date End Date Taking? Authorizing Provider  buPROPion  (WELLBUTRIN XL) 150 MG 24 hr tablet Take 150 mg by mouth every morning.    Yes Historical Provider, MD  dicyclomine (BENTYL) 10 MG capsule Take 10 mg by mouth 3 (three) times daily as needed for spasms (stomach spasms).   Yes Historical Provider, MD  docusate sodium (COLACE) 50 MG capsule Take 50 mg by mouth every morning.   Yes Historical Provider, MD  feeding supplement, RESOURCE BREEZE, (RESOURCE BREEZE) LIQD Take 1 Container by mouth 3 (three) times daily. 03/25/14  Yes Hart Carwin, MD  ondansetron (ZOFRAN ODT) 4 MG disintegrating tablet Take 1 tablet (4 mg total) by mouth every 8 (eight) hours as needed for nausea or vomiting. 03/25/14  Yes Hart Carwin, MD  Prenatal Vit-Fe Fumarate-FA (PRENATAL PLUS/IRON) 27-1 MG TABS Take 1 tablet by mouth daily.    Yes Historical Provider, MD  Simethicone (GAS-X PO) Take 1 capsule by mouth daily.   Yes Historical Provider, MD     Assessment/Plan 1.  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   2.  Raynaud's disease, 3.  Hx of GI bleed 4.  Anemia -  hgb - 9.3 - 04/22/2014 5.  Anxiety/depression 6.  Asthma   Plan:  Mobilize, she  had some rash on her hands, not sure what that is from.  She seems comfortable with PCA.  I am going to leave her on clears for now.    LOS: 2 days    JENNINGS,WILLARD 04/23/2014  Agree with above. She looks good.  Has taken some liquids, no BM. Mother in law in room.  Ovidio Kinavid Jami Bogdanski, MD, [facility] Pager: [phone] Office phone:  [phone]

## 2014-04-23 NOTE — Plan of Care (Signed)
Problem: Phase I Progression Outcomes Goal: Tubes/drains patent Outcome: Completed/Met Date Met:  04/23/14

## 2014-04-24 LAB — CBC
HCT: 32.7 % — ABNORMAL LOW (ref 36.0–46.0)
Hemoglobin: 10 g/dL — ABNORMAL LOW (ref 12.0–15.0)
MCH: 22.6 pg — ABNORMAL LOW (ref 26.0–34.0)
MCHC: 30.6 g/dL (ref 30.0–36.0)
MCV: 74 fL — ABNORMAL LOW (ref 78.0–100.0)
Platelets: 408 10*3/uL — ABNORMAL HIGH (ref 150–400)
RBC: 4.42 MIL/uL (ref 3.87–5.11)
RDW: 16.4 % — ABNORMAL HIGH (ref 11.5–15.5)
WBC: 5.2 10*3/uL (ref 4.0–10.5)

## 2014-04-24 MED ORDER — OXYCODONE HCL 5 MG PO TABS: 5.0000 mg | ORAL_TABLET | ORAL | Status: DC | PRN

## 2014-04-24 MED ORDER — BUPROPION HCL ER (XL) 150 MG PO TB24
150.0000 mg | ORAL_TABLET | Freq: Every morning | ORAL | Status: DC
  Administered 2014-04-24: 150 mg via ORAL
  Filled 2014-04-24: qty 1

## 2014-04-24 NOTE — Plan of Care (Signed)
Problem: Discharge Progression Outcomes Goal: Barriers To Progression Addressed/Resolved Outcome: Completed/Met Date Met:  04/24/14 Goal: Discharge plan in place and appropriate Outcome: Completed/Met Date Met:  04/24/14 Goal: Pain controlled with appropriate interventions Outcome: Completed/Met Date Met:  04/24/14 Goal: Hemodynamically stable Outcome: Completed/Met Date Met:  40/08/67 Goal: Complications resolved/controlled Outcome: Completed/Met Date Met:  04/24/14 Goal: Tolerating diet Outcome: Completed/Met Date Met:  04/24/14 Goal: Activity appropriate for discharge plan Outcome: Completed/Met Date Met:  04/24/14 Goal: Tubes and drains discontinued if indicated Outcome: Not Applicable Date Met:  61/95/09 Goal: Staples/sutures removed Outcome: Not Applicable Date Met:  32/67/12 Goal: Steri-Strips applied Outcome: Not Applicable Date Met:  45/80/99

## 2014-04-24 NOTE — Discharge Instructions (Signed)
[facility]      [facility], [place] [phone]  OPEN ABDOMINAL SURGERY: POST OP INSTRUCTIONS  Always review your discharge instruction sheet given to you by the facility where your surgery was performed.  IF YOU HAVE DISABILITY OR FAMILY LEAVE FORMS, YOU MUST BRING THEM TO THE OFFICE FOR PROCESSING.  PLEASE DO NOT GIVE THEM TO YOUR DOCTOR.  1. A prescription for pain medication may be given to you upon discharge.  Take your pain medication as prescribed, if needed.  If narcotic pain medicine is not needed, then you may take acetaminophen (Tylenol) or ibuprofen (Advil) as needed. 2. Take your usually prescribed medications unless otherwise directed. 3. If you need a refill on your pain medication, please contact your pharmacy. They will contact our office to request authorization.  Prescriptions will not be filled after 5pm or on week-ends. 4. . High-fiber, low fat diet as tolerated.   Be sure to include lots of fluids daily.Most patients will experience some swelling and bruising in the area of the incision. Ice pack will help. Swelling and bruising can take several days to resolve..  5. It is common to experience some constipation if taking pain medication after surgery.  Increasing fluid intake and taking a stool softener will usually help or prevent this problem from occurring.  A mild laxative (Milk of Magnesia or Miralax) should be taken according to package directions if there are no bowel movements after 48 hours. 6.  You may have steri-strips (small skin tapes) in place directly over the incision.  These strips should be left on the skin.  If your surgeon used skin glue on the incision, you may shower in 24 hours.  The glue will flake off over the next 2-3 weeks.  Any sutures or staples will be removed at the office during your follow-up visit. You may find that a light gauze bandage over your incision may keep your staples from being rubbed or pulled. You may shower and replace the bandage  daily. 7. ACTIVITIES:  You may resume regular (light) daily activities beginning the next day--such as daily self-care, walking, climbing stairs--gradually increasing activities as tolerated.  You may have sexual intercourse when it is comfortable.  Refrain from any heavy lifting or straining for 6 weeks-nothing over 10 pounds. a. You may drive when you no longer are taking prescription pain medication, you can comfortably wear a seatbelt, and you can safely maneuver your car and apply brakes b. Return to Work: ___________________________________ 8. You should see your doctor in the office for a follow-up appointment approximately two weeks after your surgery.  Make sure that you call for this appointment within a day or two after you arrive home to insure a convenient appointment time. OTHER INSTRUCTIONS:  _____________________________________________________________ _____________________________________________________________  WHEN TO CALL YOUR DOCTOR: 1. Fever over 101.0 2. Inability to urinate 3. Nausea and/or vomiting 4. Extreme swelling or bruising 5. Continued bleeding from incision. 6. Increased pain, redness, or drainage from the incision.  The clinic staff is available to answer your questions during regular business hours.  Please dont hesitate to call and ask to speak to one of the nurses if you have concerns.  For further questions, please visit [email or web address]

## 2014-04-24 NOTE — Progress Notes (Signed)
3 Days Post-Op  Subjective: Not having much if any pain.  Tolerating clear liquids.  Having flatus and BMs.  Objective: Vital signs in last 24 hours: Temp:  [97.2 F (36.2 C)-98 F (36.7 C)] 97.8 F (36.6 C) (11/26 0600) Pulse Rate:  [79-90] 80 (11/26 0600) Resp:  [14-20] 16 (11/26 0600) BP: (110-138)/(77-79) 110/77 mmHg (11/26 0600) SpO2:  [98 %-100 %] 100 % (11/26 0600) Last BM Date: 04/20/14  Intake/Output from previous day: 11/25 0701 - 11/26 0700 In: 3297.7 [P.O.:840; I.V.:2457.7] Out: 4150 [Urine:4150] Intake/Output this shift:    PE: General- In NAD Abdomen-soft, incisions are clean and intact  Lab Results:   Recent Labs  04/22/14 0420 04/24/14 0442  WBC 8.9 5.2  HGB 9.3* 10.0*  HCT 29.6* 32.7*  PLT 475* 408*   BMET  Recent Labs  04/22/14 0420  NA 137  K 4.3  CL 103  CO2 27  GLUCOSE 122*  BUN <3*  CREATININE 0.84  CALCIUM 8.7   PT/INR No results for input(s): LABPROT, INR in the last 72 hours. Comprehensive Metabolic Panel:    Component Value Date/Time   NA 137 04/22/2014 0420   NA 134* 04/17/2014 0930   K 4.3 04/22/2014 0420   K 4.5 04/17/2014 0930   CL 103 04/22/2014 0420   CL 97 04/17/2014 0930   CO2 27 04/22/2014 0420   CO2 27 04/17/2014 0930   BUN <3* 04/22/2014 0420   BUN 7 04/17/2014 0930   CREATININE 0.84 04/22/2014 0420   CREATININE 0.91 04/17/2014 0930   GLUCOSE 122* 04/22/2014 0420   GLUCOSE 84 04/17/2014 0930   CALCIUM 8.7 04/22/2014 0420   CALCIUM 9.4 04/17/2014 0930   AST 10 04/17/2014 0930   AST 8 03/21/2014 1721   ALT 7 04/17/2014 0930   ALT <5 03/21/2014 1721   ALKPHOS 90 04/17/2014 0930   ALKPHOS 76 03/21/2014 1721   BILITOT 0.3 04/17/2014 0930   BILITOT 0.4 03/21/2014 1721   PROT 7.4 04/17/2014 0930   PROT 7.9 03/21/2014 1721   ALBUMIN 3.1* 04/17/2014 0930   ALBUMIN 3.9 03/21/2014 1721     Studies/Results: No results found.  Anti-infectives: Anti-infectives    Start     Dose/Rate Route Frequency  Ordered Stop   04/21/14 2000  cefoTEtan (CEFOTAN) 2 g in dextrose 5 % 50 mL IVPB     2 g100 mL/hr over 30 Minutes Intravenous Every 12 hours 04/21/14 1122 04/21/14 1954   04/21/14 0610  cefoTEtan (CEFOTAN) 2 g in dextrose 5 % 50 mL IVPB     2 g100 mL/hr over 30 Minutes Intravenous On call to O.R. 04/21/14 0610 04/21/14 0742      Assessment Principal Problem:   Crohn's disease involving terminal ileum s/p lap ileocecectomy 04/21/14-bowel function returning.    LOS: 3 days   Plan: Advance to bland diet, decrease IVF, oral analgesic, home possibly later today or tomorrow.  Discharge instructions discussed with her and her husband.   Armanii Pressnell J 04/24/2014

## 2014-04-28 NOTE — Discharge Summary (Signed)
Physician Discharge Summary  Patient ID: Sophia Beard MRN: [id number] DOB/AGE: 27-Nov-1980 33 y.o.  Admit date: 04/21/2014 Discharge date: 04/24/2014  Admission Diagnoses:  Crohns disease with chronic partial SBO  Discharge Diagnoses:  Principal Problem:   Crohn's disease involving terminal ileum s/p lap ileocecectomy 04/21/14   Discharged Condition: good  Hospital Course: She underwent and laparoscopic assisted ileocecectomy on 04/21/14 and did well.  She had every return of bowel function, diet tolerance and ambulated well.  She was able to be discharged on POD #3.  Discharge instructions were given to her.  Final pathology was consistent with Crohn's disease with no malignancy.  Consults: None  Discharge Exam: Blood pressure 110/77, pulse 80, temperature 97.8 F (36.6 C), temperature source Oral, resp. rate 16, height 5\' 2"  (1.575 m), weight 110 lb 2 oz (49.952 kg), SpO2 100 %.   Disposition: 01-Home or Self Care     Medication List    TAKE these medications        buPROPion 150 MG 24 hr tablet  Commonly known as:  WELLBUTRIN XL  Take 150 mg by mouth every morning.     dicyclomine 10 MG capsule  Commonly known as:  BENTYL  Take 10 mg by mouth 3 (three) times daily as needed for spasms (stomach spasms).     docusate sodium 50 MG capsule  Commonly known as:  COLACE  Take 50 mg by mouth every morning.     feeding supplement (RESOURCE BREEZE) Liqd  Take 1 Container by mouth 3 (three) times daily.     GAS-X PO  Take 1 capsule by mouth daily.     ondansetron 4 MG disintegrating tablet  Commonly known as:  ZOFRAN ODT  Take 1 tablet (4 mg total) by mouth every 8 (eight) hours as needed for nausea or vomiting.     oxyCODONE 5 MG immediate release tablet  Commonly known as:  Oxy IR/ROXICODONE  Take 1-2 tablets (5-10 mg total) by mouth every 4 (four) hours as needed for moderate pain.     PRENATAL PLUS/IRON 27-1 MG Tabs  Take 1 tablet by mouth daily.          Signed: Adolph PollackOSENBOWER,Osmel Dykstra J 04/28/2014, 11:13 AM

## 2014-07-27 ENCOUNTER — Other Ambulatory Visit: Payer: Self-pay | Admitting: Internal Medicine

## 2014-07-28 NOTE — Telephone Encounter (Signed)
Sent Rx for Ondansetron (ZOFRAN), 4 mg, #30, No refills to [facility] in [place].

## 2014-09-12 ENCOUNTER — Ambulatory Visit (INDEPENDENT_AMBULATORY_CARE_PROVIDER_SITE_OTHER): Payer: 59 | Admitting: Internal Medicine

## 2014-09-12 ENCOUNTER — Other Ambulatory Visit (INDEPENDENT_AMBULATORY_CARE_PROVIDER_SITE_OTHER): Payer: 59

## 2014-09-12 ENCOUNTER — Encounter: Payer: Self-pay | Admitting: Internal Medicine

## 2014-09-12 VITALS — BP 108/62 | HR 84 | Ht 60.75 in | Wt 125.4 lb

## 2014-09-12 DIAGNOSIS — K50018 Crohn's disease of small intestine with other complication: Secondary | ICD-10-CM | POA: Diagnosis not present

## 2014-09-12 DIAGNOSIS — K5660 Unspecified intestinal obstruction: Secondary | ICD-10-CM

## 2014-09-12 DIAGNOSIS — K50119 Crohn's disease of large intestine with unspecified complications: Secondary | ICD-10-CM

## 2014-09-12 LAB — COMPREHENSIVE METABOLIC PANEL
ALT: 18 U/L (ref 0–35)
AST: 19 U/L (ref 0–37)
Albumin: 4.2 g/dL (ref 3.5–5.2)
Alkaline Phosphatase: 61 U/L (ref 39–117)
BUN: 12 mg/dL (ref 6–23)
CO2: 27 mEq/L (ref 19–32)
Calcium: 9.3 mg/dL (ref 8.4–10.5)
Chloride: 103 mEq/L (ref 96–112)
Creatinine, Ser: 0.71 mg/dL (ref 0.40–1.20)
GFR: 100.5 mL/min (ref >60.00)
Glucose, Bld: 88 mg/dL (ref 70–99)
Potassium: 3.8 mEq/L (ref 3.5–5.1)
Sodium: 135 mEq/L (ref 135–145)
Total Bilirubin: 0.3 mg/dL (ref 0.2–1.2)
Total Protein: 7.7 g/dL (ref 6.0–8.3)

## 2014-09-12 LAB — CBC WITH DIFFERENTIAL/PLATELET
Basophils Absolute: 0 10*3/uL (ref 0.0–0.1)
Basophils Relative: 0.6 % (ref 0.0–3.0)
Eosinophils Absolute: 0.1 10*3/uL (ref 0.0–0.7)
Eosinophils Relative: 1.6 % (ref 0.0–5.0)
HCT: 34.5 % — ABNORMAL LOW (ref 36.0–46.0)
Hemoglobin: 11.2 g/dL — ABNORMAL LOW (ref 12.0–15.0)
Lymphocytes Relative: 30.1 % (ref 12.0–46.0)
Lymphs Abs: 2.4 10*3/uL (ref 0.7–4.0)
MCHC: 32.6 g/dL (ref 30.0–36.0)
MCV: 66.4 fl — ABNORMAL LOW (ref 78.0–100.0)
Monocytes Absolute: 0.5 10*3/uL (ref 0.1–1.0)
Monocytes Relative: 6.6 % (ref 3.0–12.0)
Neutro Abs: 4.9 10*3/uL (ref 1.4–7.7)
Neutrophils Relative %: 61.1 % (ref 43.0–77.0)
Platelets: 359 10*3/uL (ref 150.0–400.0)
RBC: 5.19 Mil/uL — ABNORMAL HIGH (ref 3.87–5.11)
RDW: 19.6 % — ABNORMAL HIGH (ref 11.5–15.5)
WBC: 8.1 10*3/uL (ref 4.0–10.5)

## 2014-09-12 LAB — SEDIMENTATION RATE: Sed Rate: 11 mm/hr (ref 0–22)

## 2014-09-12 LAB — VITAMIN B12: Vitamin B-12: 214 pg/mL (ref 211–911)

## 2014-09-12 MED ORDER — MERCAPTOPURINE 50 MG PO TABS: 50.0000 mg | ORAL_TABLET | Freq: Every day | ORAL | Status: DC

## 2014-09-12 NOTE — Progress Notes (Signed)
Sophia Beard 02-10-1981 [id number]  Note: This dictation was prepared with Dragon digital system. Any transcriptional errors that result from this procedure are unintentional.   History of Present Illness: This is a 34 year old white female with Crohn's disease , terminal ileus stricture who underwent laparoscopically assisted ileal cecectomy for chronic small bowel obstruction on 04/21/2014 by Dr. Abbey Chatters. Her weight was down to 110 pounds. She is now fully recovered weighing 125 pounds. She feels great, denies abdominal pain, has occasional diarrhea. 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 Prior to the surgery and her CT scan of the abdomen in November 2015 showed 13 cm segment of terminal ileum with questionable abscess versus confined perforation. Last colonoscopy was 2006 and showed Crohn's disease at the ileocecal valve there were granulomas and crypt abscesses as well.    Past Medical History  Diagnosis Date  . Ovarian cyst   . Anemia   . Depression   . Crohn disease   . Anal fissure   . Anxiety   . GI bleed   . Raynaud's disease   . Family history of adverse reaction to anesthesia     mother / sister have nausea  . Asthma     exersize induced  . Raynaud's disease     Past Surgical History  Procedure Laterality Date  . Wisdom tooth extraction    . Laparoscopic ileocecectomy N/A 04/21/2014    Procedure: LAPAROSCOPIC ASSISTED ILEOCYSTECTOMY;  Surgeon: Avel Peace, MD;  Location: [facility];  Service: General;  Laterality: N/A;    Allergies  Allergen Reactions  . Iron Dextran Anaphylaxis  . Sulfonamide Derivatives Hives    Family history and social history have been reviewed.  Review of Systems: Positive for weight gain. Occasional diarrhea. Denies rectal bleeding or abdominal pain  The remainder of the 10 point ROS is negative except as outlined in the H&P  Physical  Exam: General Appearance Well developed, in no distress Eyes  Non icteric  HEENT  Non traumatic, normocephalic  Mouth No lesion, tongue papillated, no cheilosis Neck Supple without adenopathy, thyroid not enlarged, no carotid bruits, no JVD Lungs Clear to auscultation bilaterally COR Normal S1, normal S2, regular rhythm, no murmur, quiet precordium Abdomen soft nontender. Normoactive bowel sounds. No distention, well-healed surgical scars Rectal not done Extremities  No pedal edema Skin No lesions Neurological Alert and oriented x 3 Psychological Normal mood and affect  Assessment and Plan:   34 year old white female 5 months post laparoscopically assisted ileal cecectomy for Crohn's disease and  chronic small bowel obstruction. She is feeling very well  except for occasional diarrhea . We have discussed treatment of bacterial overgrowth with probiotics and following her B12 levels.in the future. We also talked about possible bile overflow diarrhea. She is interested in preventative treatment  of the Crohn's disease but does not desire biologicals. We will start 6 MP 50 mg daily. Today we will check CBC, B12, metabolic panel and sed rate. I will see her in 6 months. She will need to recheck CBC in 6 weeks    [name] 09/12/2014

## 2014-09-12 NOTE — Patient Instructions (Addendum)
Your physician has requested that you go to the basement for  lab work before leaving today. Have labs in 6 weeks, you do not need an appointment.  Follow up with Dr Juanda Chance in 6 months.  Dr Martha Clan We have sent the following medications to your pharmacy for you to pick up at your convenience:  6 MP

## 2014-09-15 ENCOUNTER — Telehealth: Payer: Self-pay | Admitting: *Deleted

## 2014-09-15 ENCOUNTER — Other Ambulatory Visit: Payer: Self-pay | Admitting: *Deleted

## 2014-09-15 DIAGNOSIS — E538 Deficiency of other specified B group vitamins: Secondary | ICD-10-CM

## 2014-09-15 DIAGNOSIS — K5090 Crohn's disease, unspecified, without complications: Secondary | ICD-10-CM

## 2014-09-15 MED ORDER — CYANOCOBALAMIN 1000 MCG/ML IJ SOLN: 1000.0000 ug | INTRAMUSCULAR | Status: DC

## 2014-09-15 NOTE — Telephone Encounter (Signed)
We discussed it. She did not want biologicals. is the only al;ternative. Entecort not indicated. We should do colonoscopy now to see if an y recurrent disease and then decide how aggressive to be  In treating her Crohn's.

## 2014-09-15 NOTE — Telephone Encounter (Signed)
Patient states she is concerned about the side effects of 6 MP and is asking if there is anything else she can take with less side effects. Please, advise.

## 2014-09-16 ENCOUNTER — Other Ambulatory Visit: Payer: Self-pay | Admitting: *Deleted

## 2014-09-16 NOTE — Telephone Encounter (Signed)
Spoke with patient and gave her recommendation. Scheduled colonoscopy on 11/19/14 at 8:30 AM and pre visit on 11/10/14 at 11:00 AM.

## 2014-09-18 ENCOUNTER — Ambulatory Visit (INDEPENDENT_AMBULATORY_CARE_PROVIDER_SITE_OTHER): Payer: 59 | Admitting: Internal Medicine

## 2014-09-18 DIAGNOSIS — E538 Deficiency of other specified B group vitamins: Secondary | ICD-10-CM | POA: Diagnosis not present

## 2014-09-18 MED ORDER — CYANOCOBALAMIN 1000 MCG/ML IJ SOLN
1000.0000 ug | INTRAMUSCULAR | Status: AC
  Administered 2014-09-18 – 2014-11-21 (×3): 1000 ug via INTRAMUSCULAR

## 2014-10-02 ENCOUNTER — Encounter: Payer: Self-pay | Admitting: Internal Medicine

## 2014-10-21 ENCOUNTER — Ambulatory Visit (INDEPENDENT_AMBULATORY_CARE_PROVIDER_SITE_OTHER): Payer: 59 | Admitting: Internal Medicine

## 2014-10-21 DIAGNOSIS — E538 Deficiency of other specified B group vitamins: Secondary | ICD-10-CM

## 2014-11-19 ENCOUNTER — Encounter: Payer: 59 | Admitting: Internal Medicine

## 2014-11-21 ENCOUNTER — Ambulatory Visit (INDEPENDENT_AMBULATORY_CARE_PROVIDER_SITE_OTHER): Payer: 59 | Admitting: Internal Medicine

## 2014-11-21 DIAGNOSIS — E538 Deficiency of other specified B group vitamins: Secondary | ICD-10-CM | POA: Diagnosis not present

## 2014-12-19 ENCOUNTER — Telehealth: Payer: Self-pay | Admitting: *Deleted

## 2014-12-19 ENCOUNTER — Ambulatory Visit (INDEPENDENT_AMBULATORY_CARE_PROVIDER_SITE_OTHER): Payer: 59 | Admitting: Internal Medicine

## 2014-12-19 DIAGNOSIS — E538 Deficiency of other specified B group vitamins: Secondary | ICD-10-CM

## 2014-12-19 MED ORDER — CYANOCOBALAMIN 1000 MCG/ML IJ SOLN
1000.0000 ug | Freq: Once | INTRAMUSCULAR | Status: AC
  Administered 2014-12-19: 1000 ug via INTRAMUSCULAR

## 2014-12-19 NOTE — Telephone Encounter (Signed)
Called and LM for Sophia Beard that her [facility] Referral expired as of 12-02-2014. She will need to call her PCP to get it renewed for further office visits.

## 2015-05-05 ENCOUNTER — Ambulatory Visit: Payer: 59 | Admitting: Gastroenterology

## 2015-07-03 ENCOUNTER — Ambulatory Visit (INDEPENDENT_AMBULATORY_CARE_PROVIDER_SITE_OTHER): Payer: BLUE CROSS/BLUE SHIELD | Admitting: Gastroenterology

## 2015-07-03 ENCOUNTER — Encounter: Payer: Self-pay | Admitting: Gastroenterology

## 2015-07-03 ENCOUNTER — Other Ambulatory Visit (INDEPENDENT_AMBULATORY_CARE_PROVIDER_SITE_OTHER): Payer: BLUE CROSS/BLUE SHIELD

## 2015-07-03 VITALS — BP 118/82 | Ht 60.75 in | Wt 137.6 lb

## 2015-07-03 DIAGNOSIS — K5010 Crohn's disease of large intestine without complications: Secondary | ICD-10-CM

## 2015-07-03 LAB — CBC WITH DIFFERENTIAL/PLATELET
Basophils Absolute: 0.1 10*3/uL (ref 0.0–0.1)
Basophils Relative: 0.7 % (ref 0.0–3.0)
Eosinophils Absolute: 0.1 10*3/uL (ref 0.0–0.7)
Eosinophils Relative: 2 % (ref 0.0–5.0)
HCT: 41.3 % (ref 36.0–46.0)
Hemoglobin: 13.3 g/dL (ref 12.0–15.0)
Lymphocytes Relative: 28.1 % (ref 12.0–46.0)
Lymphs Abs: 2 10*3/uL (ref 0.7–4.0)
MCHC: 32.1 g/dL (ref 30.0–36.0)
MCV: 74.8 fl — ABNORMAL LOW (ref 78.0–100.0)
Monocytes Absolute: 0.6 10*3/uL (ref 0.1–1.0)
Monocytes Relative: 7.8 % (ref 3.0–12.0)
Neutro Abs: 4.4 10*3/uL (ref 1.4–7.7)
Neutrophils Relative %: 61.4 % (ref 43.0–77.0)
Platelets: 319 10*3/uL (ref 150.0–400.0)
RBC: 5.52 Mil/uL — ABNORMAL HIGH (ref 3.87–5.11)
RDW: 18.1 % — ABNORMAL HIGH (ref 11.5–15.5)
WBC: 7.2 10*3/uL (ref 4.0–10.5)

## 2015-07-03 LAB — BASIC METABOLIC PANEL
BUN: 8 mg/dL (ref 6–23)
CO2: 27 mEq/L (ref 19–32)
Calcium: 9.5 mg/dL (ref 8.4–10.5)
Chloride: 104 mEq/L (ref 96–112)
Creatinine, Ser: 0.84 mg/dL (ref 0.40–1.20)
GFR: 82.38 mL/min (ref >60.00)
Glucose, Bld: 84 mg/dL (ref 70–99)
Potassium: 4.2 mEq/L (ref 3.5–5.1)
Sodium: 138 mEq/L (ref 135–145)

## 2015-07-03 LAB — IBC PANEL
Iron: 90 ug/dL (ref 42–145)
Saturation Ratios: 19.6 % — ABNORMAL LOW (ref 20.0–50.0)
Transferrin: 328 mg/dL (ref 212.0–360.0)

## 2015-07-03 LAB — SEDIMENTATION RATE: Sed Rate: 12 mm/hr (ref 0–22)

## 2015-07-03 LAB — HEPATIC FUNCTION PANEL
ALT: 25 U/L (ref 0–35)
AST: 22 U/L (ref 0–37)
Albumin: 4.2 g/dL (ref 3.5–5.2)
Alkaline Phosphatase: 69 U/L (ref 39–117)
Bilirubin, Direct: 0.1 mg/dL (ref 0.0–0.3)
Total Bilirubin: 0.5 mg/dL (ref 0.2–1.2)
Total Protein: 7.7 g/dL (ref 6.0–8.3)

## 2015-07-03 LAB — FERRITIN: Ferritin: 16.8 ng/mL (ref 10.0–291.0)

## 2015-07-03 LAB — TSH: TSH: 0.68 u[IU]/mL (ref 0.35–4.50)

## 2015-07-03 LAB — FOLATE: Folate: >23.6 ng/mL (ref >5.9)

## 2015-07-03 LAB — C-REACTIVE PROTEIN: CRP: 0.3 mg/dL — ABNORMAL LOW (ref 0.5–20.0)

## 2015-07-03 LAB — VITAMIN B12: Vitamin B-12: 312 pg/mL (ref 211–911)

## 2015-07-03 MED ORDER — CHOLESTYRAMINE LIGHT 4 G PO PACK: 4.0000 g | PACK | Freq: Two times a day (BID) | ORAL | Status: DC

## 2015-07-03 NOTE — Patient Instructions (Signed)
Go to the basement for labs today Follow up in 1 year 

## 2015-07-03 NOTE — Progress Notes (Signed)
Sophia Beard    [id number]    1980-10-12  Primary Care Physician:Shaw, Gwyndolyn Saxon, MD  Referring Physician: Marton Redwood, MD [id number] [place], [place] [address]    Chief complaint:  Crohn's disease  HPI: 35 year old white female with Crohn's disease , terminal ileal stricture who underwent laparoscopically assisted ileal cecectomy for chronic small bowel obstruction on 04/21/2014 by Dr. Zella Richer. Her weight was down to 110 pounds prior to the surgery, she has gained back most of her weight and feels well. Denies any nausea, vomiting, abdominal pain, melena or bright red blood per rectum but has intermittent diarrhea since surgery. She could have as much as 7-8 bowel movements a day on some days about  once or twice a week, no mucus or blood. No recent change in diet or medications. She is planning to get pregnant and is wondering if she needs to be started on maintenance therapy for Crohn's disease. She hasnt had any flares during her past 2 pregnancies.   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 Prior to the surgery and her CT scan of the abdomen in November 2015 showed 13 cm segment of terminal ileum with questionable abscess versus confined perforation. Last colonoscopy was 2006 and showed Crohn's disease at the ileocecal valve there were granulomas and crypt abscesses as well.    Outpatient Encounter Prescriptions as of 07/03/2015  Medication Sig  . buPROPion (WELLBUTRIN XL) 150 MG 24 hr tablet Take 150 mg by mouth every morning.   . dicyclomine (BENTYL) 10 MG capsule Take 10 mg by mouth 3 (three) times daily as needed for spasms (stomach spasms).  . ondansetron (ZOFRAN-ODT) 4 MG disintegrating tablet DISSOLVE 1 TABLET IN MOUTH EVERY 8 HOURS AS NEEDED FOR NAUSEA AND VOMITING  . Prenatal Vit-Fe Fumarate-FA (PRENATAL PLUS/IRON) 27-1 MG TABS Take 1 tablet by mouth daily.   .  [DISCONTINUED] cyanocobalamin (,VITAMIN B-12,) 1000 MCG/ML injection Inject 1 mL (1,000 mcg total) into the muscle every 30 (thirty) days. (Patient not taking: Reported on 07/03/2015)  . [DISCONTINUED] mercaptopurine (PURINETHOL) 50 MG tablet Take 1 tablet (50 mg total) by mouth daily. Give on an empty stomach 1 hour before or 2 hours after meals. Caution: Chemotherapy. (Patient not taking: Reported on 07/03/2015)   No facility-administered encounter medications on file as of 07/03/2015.    Allergies as of 07/03/2015 - Review Complete 09/12/2014  Allergen Reaction Noted  . Iron dextran Anaphylaxis 12/20/2007  . Sulfonamide derivatives Hives 12/17/2007    Past Medical History  Diagnosis Date  . Ovarian cyst   . Anemia   . Depression   . Crohn disease ([place])   . Anal fissure   . Anxiety   . GI bleed   . Raynaud's disease   . Family history of adverse reaction to anesthesia     mother / sister have nausea  . Asthma     exersize induced  . Raynaud's disease     Past Surgical History  Procedure Laterality Date  . Wisdom tooth extraction    . Laparoscopic ileocecectomy N/A 04/21/2014    Procedure: LAPAROSCOPIC ASSISTED ILEOCYSTECTOMY;  Surgeon: Jackolyn Confer, MD;  Location: [facility];  Service: General;  Laterality: N/A;    Family History  Problem Relation Age of Onset  . Colon cancer Paternal Uncle   . Heart disease Paternal Grandfather   . Irritable bowel syndrome Sister  Social History   Social History  . Marital Status: Married    Spouse Name: N/A  . Number of Children: 2  . Years of Education: N/A   Occupational History  . Herbalist   .  [employer]   Social History Main Topics  . Smoking status: Never Smoker   . Smokeless tobacco: Never Used  . Alcohol Use: No  . Drug Use: No  . Sexual Activity: Not on file   Other Topics Concern  . Not on file   Social History Narrative      Review of systems: Review of Systems  Constitutional: Negative  for fever and chills.  HENT: Negative.   Eyes: Negative for blurred vision.  Respiratory: Negative for cough, shortness of breath and wheezing.   Cardiovascular: Negative for chest pain and palpitations.  Gastrointestinal: as per HPI Genitourinary: Negative for dysuria, urgency, frequency and hematuria.  Musculoskeletal: Negative for myalgias, back pain and joint pain.  Skin: Negative for itching and rash.  Neurological: Negative for dizziness, tremors, focal weakness, seizures and loss of consciousness.  Endo/Heme/Allergies: Negative for environmental allergies.  Psychiatric/Behavioral: Negative for depression, suicidal ideas and hallucinations.  All other systems reviewed and are negative.   Physical Exam: Filed Vitals:   07/03/15 0847  BP: 118/82   Gen:      No acute distress HEENT:  EOMI, sclera anicteric Neck:     No masses; no thyromegaly Lungs:    Clear to auscultation bilaterally; normal respiratory effort CV:         Regular rate and rhythm; no murmurs Abd:      + bowel sounds; soft, non-tender; no palpable masses, no distension Ext:    No edema; adequate peripheral perfusion Skin:      Warm and dry; no rash Neuro: alert and oriented x 3 Psych: normal mood and affect  Data Reviewed:  As per HPI   Assessment and Plan/Recommendations:  35 year old female with history of Crohn's disease predominantly stricturing disease of the terminal ileum, status post resection of terminal ileum in 2015 here with complaints of intermittent diarrhea s/p ileal resection Diarrhea could be related possible lactose intolerance, and also could be due bile salt associated diarrhea status post terminal ileal resection Advised patient to avoid lactose, soda and high fructose corn syrup We'll start cholestyramine for empiric trial of bile salt diarrhea Check CBC, BMP, LFT, ESR/CRP, iron panel, B12/folate She is planning to get pregnant, currently asymptomatic, if has any elevation in  inflammatory markers, will consider colonoscopy and +/- small bowel video capsule to assess disease activity and may need maintenance therapy to prevent any Crohn's disease flares during her pregnancy. The choice of medication will be Cimzia, if we have to initiate therapy Return in 1 year or sooner if needed  K. Denzil Magnuson , MD [phone] Mon-Fri 8a-5p [phone] after 5p, weekends, holidays

## 2015-07-07 ENCOUNTER — Telehealth: Payer: Self-pay | Admitting: Gastroenterology

## 2015-07-07 NOTE — Telephone Encounter (Signed)
Please review

## 2015-07-08 IMAGING — CT CT ABD-PELV W/ CM
2 of 4 series · 17 of 46 positions shown, 19 images · IV contrast (OMNIPAQUE)
Comparison: 02/27/2014.

CLINICAL DATA: Mid to lower abdominal pain for 1 week. History of
Crohn's disease since 9883. No prior surgeries.

EXAM:
CT ABDOMEN AND PELVIS WITH CONTRAST
TECHNIQUE: Multidetector CT imaging of the abdomen and pelvis was performed
using the standard protocol following bolus administration of
intravenous contrast.
CONTRAST:  100mL OMNIPAQUE IOHEXOL 300 MG/ML  SOLN

[Series 2: rtn a/p with · axial · 0.71mm/px · z∈[-362,+2]mm · 14 of 81 slices shown, 16 images]
[im 4/81  soft-tissue]
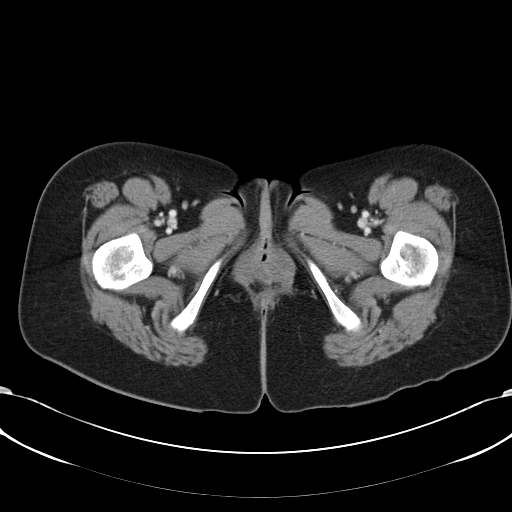
[im 4/81  bone]
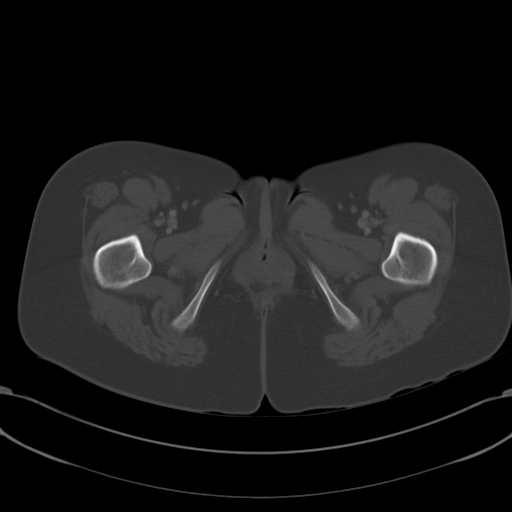
[im 11/81  soft-tissue]
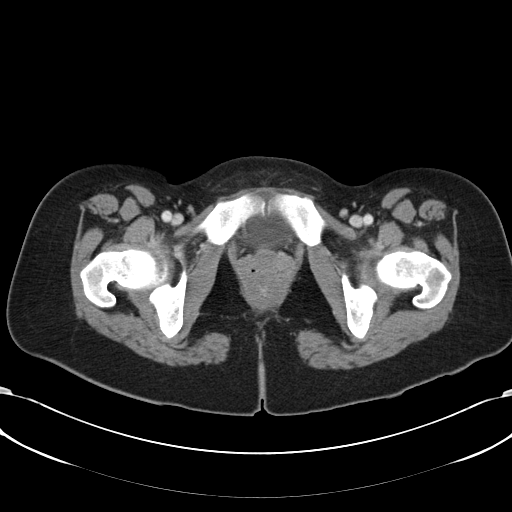
[im 17/81  soft-tissue]
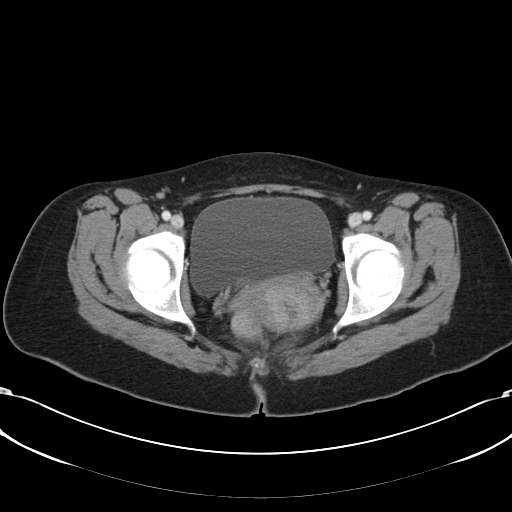
[im 21/81  soft-tissue]
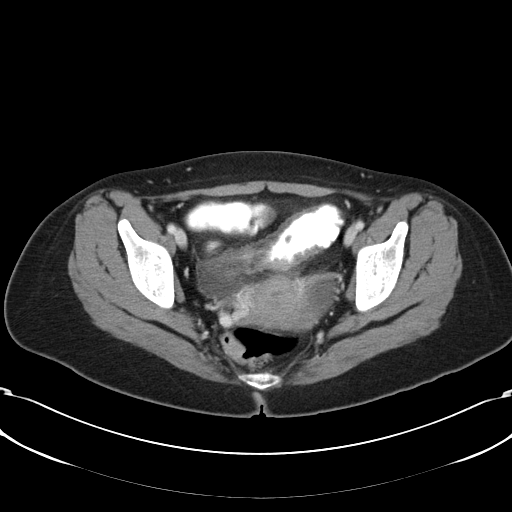
[im 27/81  soft-tissue]
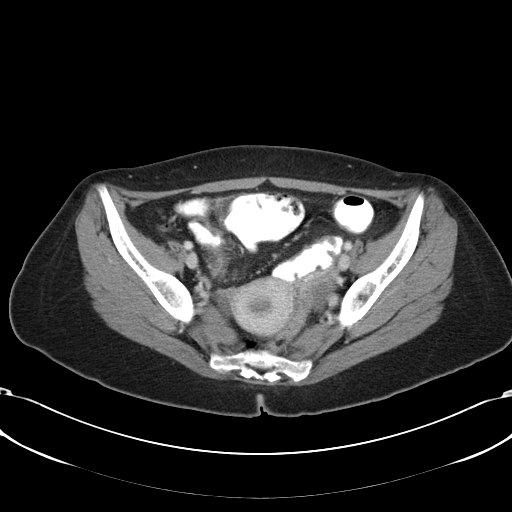
[im 34/81  soft-tissue]
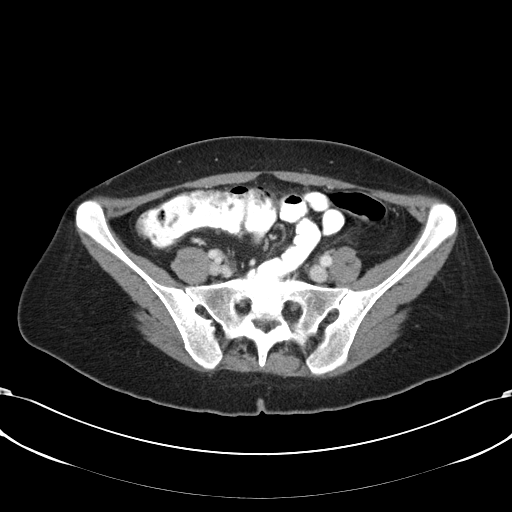
[im 37/81  soft-tissue]
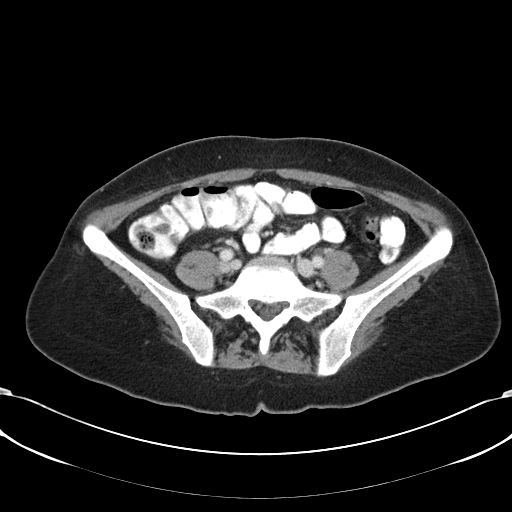
[im 44/81  soft-tissue]
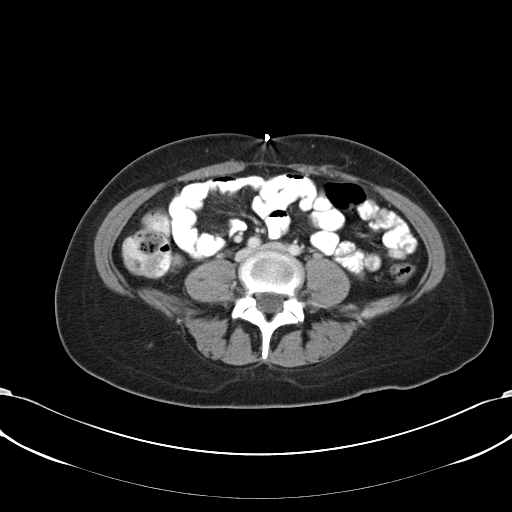
[im 47/81  soft-tissue]
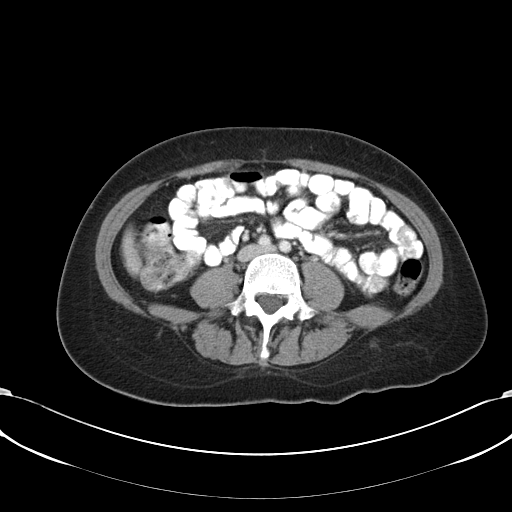
[im 47/81  bone]
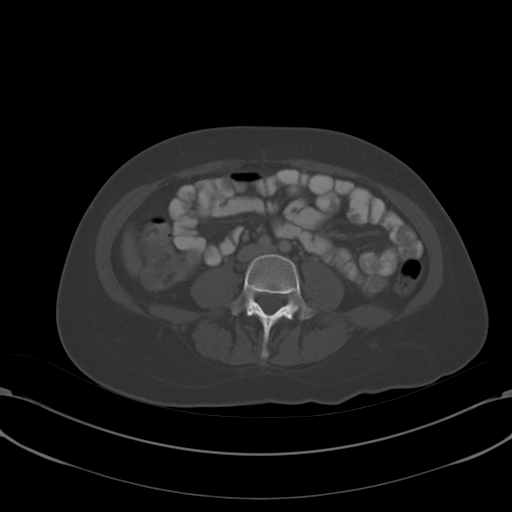
[im 54/81  soft-tissue]
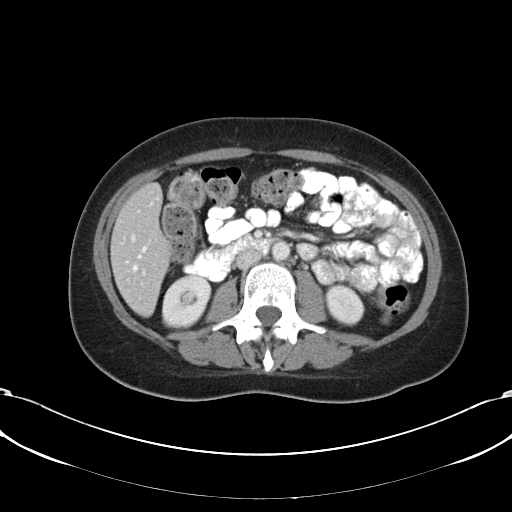
[im 61/81  soft-tissue]
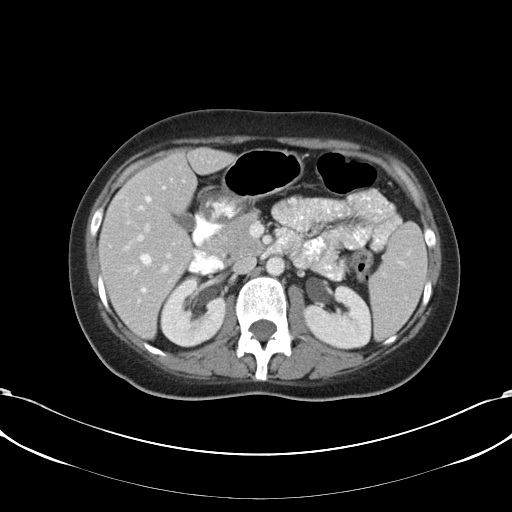
[im 64/81  soft-tissue]
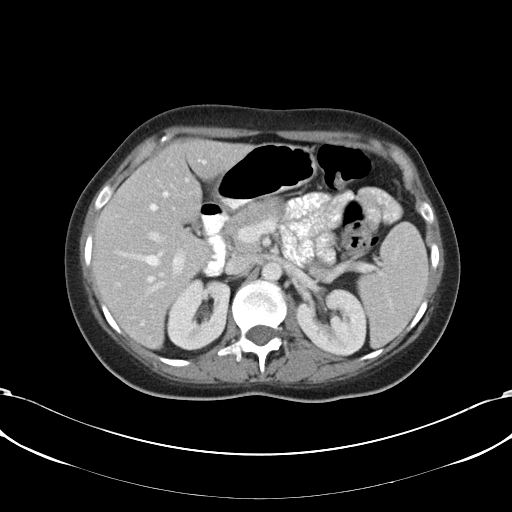
[im 71/81  soft-tissue]
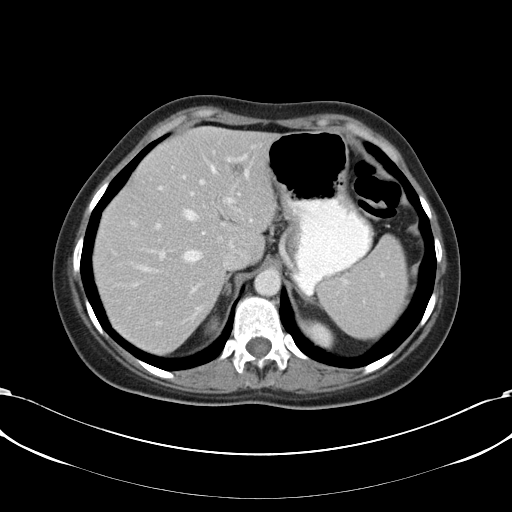
[im 77/81  soft-tissue]
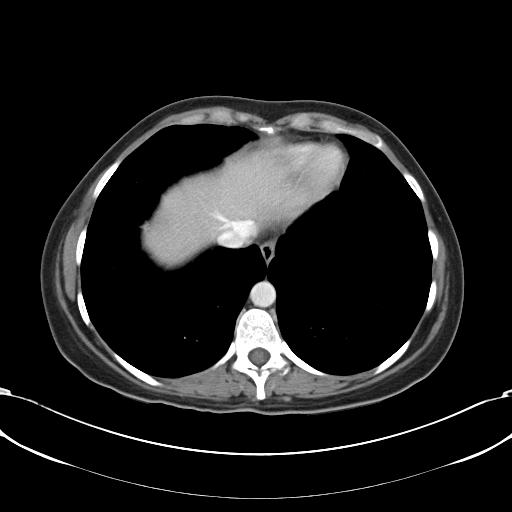

[Series 602: <mpr thick range> · coronal · 0.79mm/px · 3 of 76 slices shown]
[im 26/76  soft-tissue]
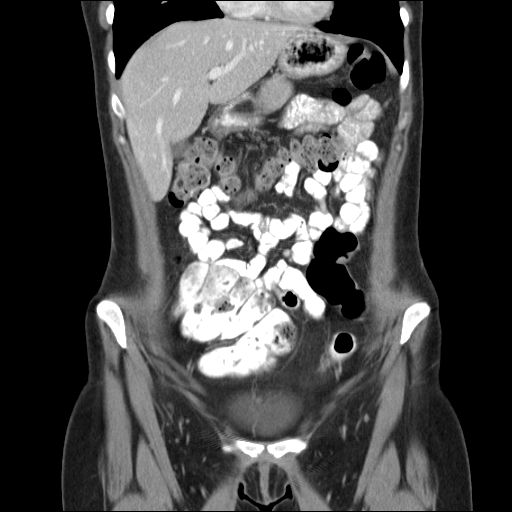
[im 34/76  soft-tissue]
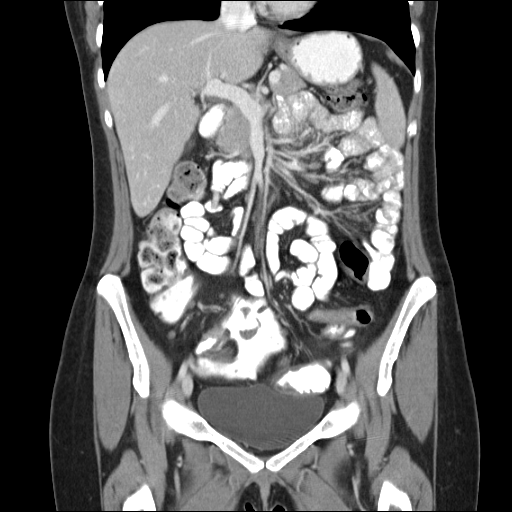
[im 42/76  soft-tissue]
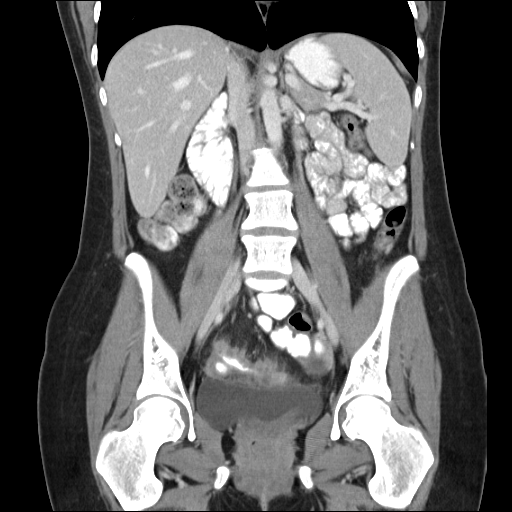

[17 of 46 positions shown; findings below may reference images not displayed]

FINDINGS: The distal ileum shows irregular wall thickening and congestion of
the mesenteric vessels with a few prominent sub cm mesenteric lymph
nodes. The wall thickening and inflammation extends from the
ileocecal junction proximally for approximately 15 cm.

The remainder of the small bowel shows normal wall thickness. There
are no other areas of bowel wall inflammation. No colonic wall
thickening or inflammatory changes seen. Stomach and duodenum are
unremarkable.

Clear lung bases.  Heart is normal in size.

Small low-density lesion is noted in the lateral segment of the left
lobe of the liver measuring 8 mm. This was not evident previously.
Has some nodular enhancement along its periphery. This may be a
hemangioma. Liver otherwise unremarkable.

Normal spleen, gallbladder and pancreas. No bile duct dilation. No
adrenal masses. Kidneys, ureters and bladder are unremarkable.

There is a septated cystic lesion in the left adnexa measuring
cm x 3.2 cm by 3.6 cm, most consistent with the left ovary mildly
enlarged by cysts. These are physiologic sized this patient's age.

No pathologically enlarged lymph nodes. No ascites. No evidence of
an abscess.

No significant bony abnormality.
IMPRESSION: 1. Active inflammatory bowel disease involving the terminal 15 cm of
the distal ileum. Mild associated mesenteric inflammation. No
abscess or fistula.
2. No other areas of bowel inflammation.
3. No other acute findings.

## 2015-07-09 NOTE — Telephone Encounter (Signed)
Labs wnl

## 2015-07-13 NOTE — Telephone Encounter (Signed)
Sent to me in error. 

## 2015-07-15 NOTE — Telephone Encounter (Signed)
Patient aware of results.

## 2015-08-17 ENCOUNTER — Other Ambulatory Visit: Payer: Self-pay | Admitting: Obstetrics and Gynecology

## 2015-08-18 ENCOUNTER — Encounter (HOSPITAL_COMMUNITY): Payer: Self-pay | Admitting: *Deleted

## 2015-08-19 ENCOUNTER — Ambulatory Visit (HOSPITAL_COMMUNITY)
Admission: RE | Admit: 2015-08-19 | Payer: BLUE CROSS/BLUE SHIELD | Source: Ambulatory Visit | Admitting: Obstetrics and Gynecology

## 2015-08-19 ENCOUNTER — Encounter (HOSPITAL_COMMUNITY): Admission: RE | Payer: Self-pay | Source: Ambulatory Visit

## 2015-08-19 SURGERY — DILATION AND EVACUATION, UTERUS
Anesthesia: Choice

## 2015-08-20 ENCOUNTER — Ambulatory Visit (HOSPITAL_COMMUNITY)
Admission: RE | Admit: 2015-08-20 | Payer: BLUE CROSS/BLUE SHIELD | Source: Ambulatory Visit | Admitting: Obstetrics and Gynecology

## 2015-08-20 HISTORY — DX: Other specified postprocedural states: Z98.890

## 2015-08-20 HISTORY — DX: Other specified postprocedural states: R11.2

## 2015-08-20 SURGERY — DILATION AND EVACUATION, UTERUS
Anesthesia: Choice

## 2015-09-02 DIAGNOSIS — O021 Missed abortion: Secondary | ICD-10-CM | POA: Diagnosis not present

## 2015-09-02 DIAGNOSIS — Z3A01 Less than 8 weeks gestation of pregnancy: Secondary | ICD-10-CM | POA: Diagnosis not present

## 2015-09-09 DIAGNOSIS — O021 Missed abortion: Secondary | ICD-10-CM | POA: Diagnosis not present

## 2015-09-16 DIAGNOSIS — O021 Missed abortion: Secondary | ICD-10-CM | POA: Diagnosis not present

## 2015-10-22 DIAGNOSIS — N96 Recurrent pregnancy loss: Secondary | ICD-10-CM | POA: Diagnosis not present

## 2015-11-09 DIAGNOSIS — E7212 Methylenetetrahydrofolate reductase deficiency: Secondary | ICD-10-CM | POA: Diagnosis not present

## 2015-11-10 ENCOUNTER — Encounter: Payer: Self-pay | Admitting: Hematology and Oncology

## 2015-11-10 ENCOUNTER — Telehealth: Payer: Self-pay | Admitting: Hematology and Oncology

## 2015-11-10 NOTE — Telephone Encounter (Signed)
Patient returned my call to setup hem appointment with Dr. Bertis Ruddy on 6/27 at 2:30PM. Demographics verified and instructions to the center were given. Letter faxed to the referring.

## 2015-11-24 ENCOUNTER — Ambulatory Visit (HOSPITAL_BASED_OUTPATIENT_CLINIC_OR_DEPARTMENT_OTHER): Payer: BLUE CROSS/BLUE SHIELD | Admitting: Hematology and Oncology

## 2015-11-24 ENCOUNTER — Encounter: Payer: Self-pay | Admitting: Hematology and Oncology

## 2015-11-24 ENCOUNTER — Telehealth: Payer: Self-pay | Admitting: Hematology and Oncology

## 2015-11-24 VITALS — BP 131/88 | HR 83 | Temp 98.2°F | Resp 18 | Ht 62.0 in | Wt 143.5 lb

## 2015-11-24 DIAGNOSIS — N96 Recurrent pregnancy loss: Secondary | ICD-10-CM

## 2015-11-24 DIAGNOSIS — D6859 Other primary thrombophilia: Secondary | ICD-10-CM

## 2015-11-24 DIAGNOSIS — E7212 Methylenetetrahydrofolate reductase deficiency: Secondary | ICD-10-CM | POA: Diagnosis not present

## 2015-11-24 DIAGNOSIS — Z1589 Genetic susceptibility to other disease: Secondary | ICD-10-CM

## 2015-11-24 NOTE — Telephone Encounter (Signed)
GAVE PT CAL & AVS °

## 2015-11-25 DIAGNOSIS — N96 Recurrent pregnancy loss: Secondary | ICD-10-CM | POA: Insufficient documentation

## 2015-11-25 DIAGNOSIS — E7212 Methylenetetrahydrofolate reductase deficiency: Secondary | ICD-10-CM | POA: Insufficient documentation

## 2015-11-25 DIAGNOSIS — Z1589 Genetic susceptibility to other disease: Secondary | ICD-10-CM | POA: Insufficient documentation

## 2015-11-25 NOTE — Assessment & Plan Note (Signed)
The patient was told that she had MTHFR mutation although I do not have copy of the test results. This could certainly predispose her to risks of recurrent miscarriages. I recommend she takes high-dose folic acid and aspirin as directed

## 2015-11-25 NOTE — Assessment & Plan Note (Signed)
It is very peculiar that she had 2 successful pregnancies in the past. She had recent recurrent miscarriages that could be related to her age or possibility of scar tissues related to prior colon resection from Crohn's disease. She was diagnosed with MTHFR mutation recently. She is currently taking aspirin and folic acid supplements which I support. As above, I will proceed to repeat protein S level to exclude thrombophilia disorder as a cause of her recurrent miscarriages.

## 2015-11-25 NOTE — Progress Notes (Signed)
[facility] CONSULT NOTE  Patient Care Team: Martha Clan, MD as PCP - General (Internal Medicine) Olivia Mackie, MD as Consulting Physician (Obstetrics and Gynecology)  CHIEF COMPLAINTS/PURPOSE OF CONSULTATION:  Recurrent first trimester miscarriages, borderline low protein S level, MTHFR mutation  HISTORY OF PRESENTING ILLNESS:  Sophia Beard 35 y.o. female is here because of recent recurrent miscarriages. This patient has 2 successful pregnancies in the past. She has 2 daughters, age 53 and 91. Both daughters were born full-term from spontaneous vaginal deliveries. The patient had history of Crohn's colitis and underwent partial colectomy in November 2015. Since then, she had relatively stable disease with only 2 loose stool per day without passage of blood or mucus. Starting around March of this year, she had an early trimester miscarriage at about 9 weeks pregnancy. According to the patient, she started to have vaginal bleeding at about 5 weeks and subsequently had spontaneous loss of pregnancy. She became pregnant again around May of this year. She suspect that she may be around 4-[redacted] weeks pregnant. She said it has significant vaginal spotting followed by heavy menstrual flow. She was seen by her gynecologist who ordered multiple blood tests around 10/22/15. On review of her test results, she was found to have borderline low protein S level. Lupus anticoagulant, prothrombin gene mutation study, anti-thrombin III level, protein C level, factor V Leiden mutation study were all within normal limits. I do not have copy of her test result related to MTHFR mutation but the patient was told that she has 2 allelic mutations and was prescribed high-dose folic acid supplement. She also started to take 81 mg aspirin therapy daily. There were no reported family history of recurrent miscarriages in her mother. The patient has never been diagnosed with diagnosis of blood clots. She has not  taken any birth control pills since teenage years. She desire future pregnancies She complained Raynaud's phenomenon affecting her feet  MEDICAL HISTORY:  Past Medical History  Diagnosis Date  . Ovarian cyst   . Anemia   . Depression   . Crohn disease (HCC)   . Anal fissure   . Anxiety   . GI bleed   . Raynaud's disease   . Family history of adverse reaction to anesthesia     mother / sister have nausea  . Asthma     exersize induced  . Raynaud's disease   . PONV (postoperative nausea and vomiting)     SURGICAL HISTORY: Past Surgical History  Procedure Laterality Date  . Wisdom tooth extraction    . Laparoscopic ileocecectomy N/A 04/21/2014    Procedure: LAPAROSCOPIC ASSISTED ILEOCYSTECTOMY;  Surgeon: Avel Peace, MD;  Location: [facility];  Service: General;  Laterality: N/A;    SOCIAL HISTORY: Social History   Social History  . Marital Status: Married    Spouse Name: N/A  . Number of Children: 2  . Years of Education: N/A   Occupational History  . Librarian, academic   .  [employer]   Social History Main Topics  . Smoking status: Never Smoker   . Smokeless tobacco: Never Used  . Alcohol Use: No  . Drug Use: No  . 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 Has 2 daughters   Other Topics Concern  . Not on file   Social History Narrative    FAMILY HISTORY: Family History  Problem Relation Age of Onset  . Colon cancer Paternal Uncle   .  Heart disease Paternal Grandfather   . Irritable bowel syndrome Sister     ALLERGIES:  is allergic to iron dextran and sulfonamide derivatives.  MEDICATIONS:  Current Outpatient Prescriptions  Medication Sig Dispense Refill  . aspirin 81 MG tablet Take 81 mg by mouth daily.    Marland Kitchen buPROPion (WELLBUTRIN XL) 150 MG 24 hr tablet Take 150 mg by mouth every morning.     . dicyclomine (BENTYL) 10 MG capsule Take 10 mg by mouth 3 (three) times daily as needed for spasms (stomach  spasms).    . folic acid (FOLVITE) 1 MG tablet Take 1 mg by mouth daily.    . ondansetron (ZOFRAN-ODT) 4 MG disintegrating tablet DISSOLVE 1 TABLET IN MOUTH EVERY 8 HOURS AS NEEDED FOR NAUSEA AND VOMITING 30 tablet 0  . Prenatal Vit-Fe Fumarate-FA (PRENATAL PLUS/IRON) 27-1 MG TABS Take 1 tablet by mouth daily.      No current facility-administered medications for this visit.    REVIEW OF SYSTEMS:   Constitutional: Denies fevers, chills or abnormal night sweats Eyes: Denies blurriness of vision, double vision or watery eyes Ears, nose, mouth, throat, and face: Denies mucositis or sore throat Respiratory: Denies cough, dyspnea or wheezes Cardiovascular: Denies palpitation, chest discomfort or lower extremity swelling Gastrointestinal:  Denies nausea, heartburn or change in bowel habits Skin: Denies abnormal skin rashes Lymphatics: Denies new lymphadenopathy or easy bruising Neurological:Denies numbness, tingling or new weaknesses Behavioral/Psych: Mood is stable, no new changes  All other systems were reviewed with the patient and are negative.  PHYSICAL EXAMINATION: ECOG PERFORMANCE STATUS: 0 - Asymptomatic  Filed Vitals:   11/24/15 1419  BP: 131/88  Pulse: 83  Temp: 98.2 F (36.8 C)  Resp: 18   Filed Weights   11/24/15 1419  Weight: 143 lb 8 oz (65.091 kg)    GENERAL:alert, no distress and comfortable SKIN: skin color, texture, turgor are normal, no rashes or significant lesions EYES: normal, conjunctiva are pink and non-injected, sclera clear OROPHARYNX:no exudate, no erythema and lips, buccal mucosa, and tongue normal  NECK: supple, thyroid normal size, non-tender, without nodularity LYMPH:  no palpable lymphadenopathy in the cervical, axillary or inguinal LUNGS: clear to auscultation and percussion with normal breathing effort HEART: regular rate & rhythm and no murmurs and no lower extremity edema ABDOMEN:abdomen soft, non-tender and normal bowel  sounds Musculoskeletal:no cyanosis of digits and no clubbing  PSYCH: alert & oriented x 3 with fluent speech NEURO: no focal motor/sensory deficits  LABORATORY DATA:  I have reviewed the data as listed Lab Results  Component Value Date   WBC 7.2 07/03/2015   HGB 13.3 07/03/2015   HCT 41.3 07/03/2015   MCV 74.8* 07/03/2015   PLT 319.0 07/03/2015    Recent Labs  07/03/15 0934  NA 138  K 4.2  CL 104  CO2 27  GLUCOSE 84  BUN 8  CREATININE 0.84  CALCIUM 9.5  PROT 7.7  ALBUMIN 4.2  AST 22  ALT 25  ALKPHOS 69  BILITOT 0.5  BILIDIR 0.1    ASSESSMENT & PLAN:  Protein S deficiency (HCC) We discussed test limitation. At the time around her recent blood work, the patient has heavy menstrual flow. The borderline low protein S detected then could be related to consumption. I recommend rechecking her blood work again next month. Protein S is synthesized in the liver and is a vitamin K dependent pathway. I recommend vitamin K rich diet with green leafy vegetables for at least 1 week prior to repeat  blood draw I will call the patient with test results. If her repeat protein S level is again low, the patient may have an inheritable thrombophilia disorder that could predispose her to recurrent risk of miscarriages in the future, despite successful prior pregnancies. In that situation, she may benefit from prophylactic Lovenox injection during pregnancy and postpartum. However, if her repeat protein S level is within normal limits, there would be no indication to prescribe prophylactic Lovenox injection. She understood the plan of care.  MTHFR mutation [facility]) The patient was told that she had MTHFR mutation although I do not have copy of the test results. This could certainly predispose her to risks of recurrent miscarriages. I recommend she takes high-dose folic acid and aspirin as directed  History of recurrent miscarriages, not currently pregnant It is very peculiar that she had  2 successful pregnancies in the past. She had recent recurrent miscarriages that could be related to her age or possibility of scar tissues related to prior colon resection from Crohn's disease. She was diagnosed with MTHFR mutation recently. She is currently taking aspirin and folic acid supplements which I support. As above, I will proceed to repeat protein S level to exclude thrombophilia disorder as a cause of her recurrent miscarriages.   Orders Placed This Encounter  Procedures  . Protein S, Antigen, Free*    Standing Status: Future     Number of Occurrences:      Standing Expiration Date: 11/23/2016  . Protein S activity*    Standing Status: Future     Number of Occurrences:      Standing Expiration Date: 11/23/2016     All questions were answered. The patient knows to call the clinic with any problems, questions or concerns. I spent 40 minutes counseling the patient face to face. The total time spent in the appointment was 55 minutes and more than 50% was on counseling.     [facility], Riyan Haile, MD 11/25/2015 8:18 AM

## 2015-11-25 NOTE — Assessment & Plan Note (Signed)
We discussed test limitation. At the time around her recent blood work, the patient has heavy menstrual flow. The borderline low protein S detected then could be related to consumption. I recommend rechecking her blood work again next month. Protein S is synthesized in the liver and is a vitamin K dependent pathway. I recommend vitamin K rich diet with green leafy vegetables for at least 1 week prior to repeat blood draw I will call the patient with test results. If her repeat protein S level is again low, the patient may have an inheritable thrombophilia disorder that could predispose her to recurrent risk of miscarriages in the future, despite successful prior pregnancies. In that situation, she may benefit from prophylactic Lovenox injection during pregnancy and postpartum. However, if her repeat protein S level is within normal limits, there would be no indication to prescribe prophylactic Lovenox injection. She understood the plan of care.

## 2015-11-28 DIAGNOSIS — D6859 Other primary thrombophilia: Secondary | ICD-10-CM

## 2015-11-28 HISTORY — DX: Other primary thrombophilia: D68.59

## 2015-12-14 ENCOUNTER — Other Ambulatory Visit (HOSPITAL_BASED_OUTPATIENT_CLINIC_OR_DEPARTMENT_OTHER): Payer: BLUE CROSS/BLUE SHIELD

## 2015-12-14 DIAGNOSIS — D6859 Other primary thrombophilia: Secondary | ICD-10-CM | POA: Diagnosis not present

## 2015-12-15 LAB — PROTEIN S, ANTIGEN, FREE: Protein S, Free: 39 % — ABNORMAL LOW (ref 57–157)

## 2015-12-15 LAB — PROTEIN S ACTIVITY: Protein S-Functional: 62 % — ABNORMAL LOW (ref 63–140)

## 2015-12-18 ENCOUNTER — Telehealth: Payer: Self-pay | Admitting: Hematology and Oncology

## 2015-12-18 NOTE — Telephone Encounter (Signed)
I reviewed the blood test result with the patient. She has confirmed protein S deficiency. We discussed the implication of this. Since she desire for future pregnancies, she would need to be placed on Lovenox injection in the future. For now, I recommend she stays on 81 mg aspirin along with folic acid supplementation. When she becomes pregnant, she will call me and I will work her in for future follow-up.

## 2015-12-28 DIAGNOSIS — L2089 Other atopic dermatitis: Secondary | ICD-10-CM | POA: Diagnosis not present

## 2015-12-28 DIAGNOSIS — L858 Other specified epidermal thickening: Secondary | ICD-10-CM | POA: Diagnosis not present

## 2016-02-09 DIAGNOSIS — Z1151 Encounter for screening for human papillomavirus (HPV): Secondary | ICD-10-CM | POA: Diagnosis not present

## 2016-02-09 DIAGNOSIS — Z01419 Encounter for gynecological examination (general) (routine) without abnormal findings: Secondary | ICD-10-CM | POA: Diagnosis not present

## 2016-02-09 DIAGNOSIS — Z6827 Body mass index (BMI) 27.0-27.9, adult: Secondary | ICD-10-CM | POA: Diagnosis not present

## 2016-02-29 DIAGNOSIS — N920 Excessive and frequent menstruation with regular cycle: Secondary | ICD-10-CM | POA: Diagnosis not present

## 2016-04-12 ENCOUNTER — Other Ambulatory Visit: Payer: Self-pay | Admitting: Hematology and Oncology

## 2016-04-12 ENCOUNTER — Telehealth: Payer: Self-pay | Admitting: *Deleted

## 2016-04-12 DIAGNOSIS — Z8759 Personal history of other complications of pregnancy, childbirth and the puerperium: Secondary | ICD-10-CM | POA: Diagnosis not present

## 2016-04-12 DIAGNOSIS — O2620 Pregnancy care for patient with recurrent pregnancy loss, unspecified trimester: Secondary | ICD-10-CM | POA: Insufficient documentation

## 2016-04-12 DIAGNOSIS — Z3201 Encounter for pregnancy test, result positive: Secondary | ICD-10-CM | POA: Diagnosis not present

## 2016-04-12 DIAGNOSIS — D6859 Other primary thrombophilia: Secondary | ICD-10-CM

## 2016-04-12 DIAGNOSIS — O2621 Pregnancy care for patient with recurrent pregnancy loss, first trimester: Secondary | ICD-10-CM

## 2016-04-12 NOTE — Telephone Encounter (Signed)
Pt called to say she was to call for an appointment as soon as she has a positive pregnancy test. She is pregnant.  Dr Bertis Ruddy will see her Friday. 2:00 lab, 2:30 Dr Bertis Ruddy, 3:00 injection and lovenox instructions.  Msg to scheduler

## 2016-04-14 DIAGNOSIS — Z3A01 Less than 8 weeks gestation of pregnancy: Secondary | ICD-10-CM | POA: Diagnosis not present

## 2016-04-14 DIAGNOSIS — O09291 Supervision of pregnancy with other poor reproductive or obstetric history, first trimester: Secondary | ICD-10-CM | POA: Diagnosis not present

## 2016-04-15 ENCOUNTER — Ambulatory Visit (HOSPITAL_BASED_OUTPATIENT_CLINIC_OR_DEPARTMENT_OTHER): Payer: BLUE CROSS/BLUE SHIELD | Admitting: Hematology and Oncology

## 2016-04-15 ENCOUNTER — Ambulatory Visit (HOSPITAL_BASED_OUTPATIENT_CLINIC_OR_DEPARTMENT_OTHER): Payer: BLUE CROSS/BLUE SHIELD

## 2016-04-15 ENCOUNTER — Encounter: Payer: Self-pay | Admitting: Hematology and Oncology

## 2016-04-15 ENCOUNTER — Other Ambulatory Visit (HOSPITAL_BASED_OUTPATIENT_CLINIC_OR_DEPARTMENT_OTHER): Payer: BLUE CROSS/BLUE SHIELD

## 2016-04-15 VITALS — BP 127/85 | HR 92 | Temp 98.4°F | Resp 18 | Wt 151.3 lb

## 2016-04-15 DIAGNOSIS — Z1589 Genetic susceptibility to other disease: Secondary | ICD-10-CM

## 2016-04-15 DIAGNOSIS — D6859 Other primary thrombophilia: Secondary | ICD-10-CM

## 2016-04-15 DIAGNOSIS — E7212 Methylenetetrahydrofolate reductase deficiency: Secondary | ICD-10-CM

## 2016-04-15 DIAGNOSIS — O2620 Pregnancy care for patient with recurrent pregnancy loss, unspecified trimester: Secondary | ICD-10-CM | POA: Insufficient documentation

## 2016-04-15 DIAGNOSIS — O2621 Pregnancy care for patient with recurrent pregnancy loss, first trimester: Secondary | ICD-10-CM

## 2016-04-15 LAB — CBC WITH DIFFERENTIAL/PLATELET
BASO%: 1 % (ref 0.0–2.0)
Basophils Absolute: 0.1 10*3/uL (ref 0.0–0.1)
EOS%: 1.4 % (ref 0.0–7.0)
Eosinophils Absolute: 0.1 10*3/uL (ref 0.0–0.5)
HCT: 42.2 % (ref 34.8–46.6)
HGB: 14.2 g/dL (ref 11.6–15.9)
LYMPH%: 20.7 % (ref 14.0–49.7)
MCH: 29 pg (ref 25.1–34.0)
MCHC: 33.7 g/dL (ref 31.5–36.0)
MCV: 86.1 fL (ref 79.5–101.0)
MONO#: 0.6 10*3/uL (ref 0.1–0.9)
MONO%: 6.3 % (ref 0.0–14.0)
NEUT#: 6.7 10*3/uL — ABNORMAL HIGH (ref 1.5–6.5)
NEUT%: 70.6 % (ref 38.4–76.8)
Platelets: 326 10*3/uL (ref 145–400)
RBC: 4.91 10*6/uL (ref 3.70–5.45)
RDW: 12.8 % (ref 11.2–14.5)
WBC: 9.5 10*3/uL (ref 3.9–10.3)
lymph#: 2 10*3/uL (ref 0.9–3.3)

## 2016-04-15 LAB — BASIC METABOLIC PANEL
Anion Gap: 9 mEq/L (ref 3–11)
BUN: 6.9 mg/dL — ABNORMAL LOW (ref 7.0–26.0)
CO2: 21 mEq/L — ABNORMAL LOW (ref 22–29)
Calcium: 9.1 mg/dL (ref 8.4–10.4)
Chloride: 106 mEq/L (ref 98–109)
Creatinine: 0.8 mg/dL (ref 0.6–1.1)
EGFR: >90 mL/min/{1.73_m2} (ref >90)
Glucose: 87 mg/dl (ref 70–140)
Potassium: 3.5 mEq/L (ref 3.5–5.1)
Sodium: 136 mEq/L (ref 136–145)

## 2016-04-15 MED ORDER — ENOXAPARIN SODIUM 40 MG/0.4ML ~~LOC~~ SOLN
40.0000 mg | SUBCUTANEOUS | 10 refills | Status: DC
Start: 2016-04-15 — End: 2016-06-17

## 2016-04-15 MED ORDER — ENOXAPARIN SODIUM 40 MG/0.4ML ~~LOC~~ SOLN
40.0000 mg | Freq: Once | SUBCUTANEOUS | Status: AC
  Administered 2016-04-15: 40 mg via SUBCUTANEOUS
  Filled 2016-04-15: qty 0.4

## 2016-04-15 MED FILL — ENOXAPARIN 40 MG/0.4 ML SYR: 40 | 30 days supply | Qty: 12 | Fill #0 | Status: TO

## 2016-04-15 NOTE — Assessment & Plan Note (Signed)
The patient was told that she had MTHFR mutation although I do not have copy of the test results. This could certainly predispose her to risks of recurrent miscarriages. I recommend she takes high-dose folic acid

## 2016-04-15 NOTE — Patient Instructions (Signed)
How and Where to Give Subcutaneous Enoxaparin Injections °Enoxaparin is an injectable medicine. It is used to help prevent blood clots from developing in your veins. Health care providers often use anticoagulants like enoxaparin to prevent clots following surgery. Enoxaparin is also used in combination with other medicines to treat blood clots and heart attacks. If blood clots are left untreated, they can be life threatening. °Enoxaparin comes in single-use syringes. You inject enoxaparin through a syringe into your belly (abdomen). You should change the injection site each time you give yourself a shot. Continue the enoxaparin injections as directed by your health care provider. Your health care provider will use blood clotting test results to decide when you can safely stop using enoxaparin injections. If your health care provider prescribes any additional medicines, use the medicines exactly as directed. °How do I inject enoxaparin? °1. Wash your hands with soap and water. °2. Clean the selected injection site as directed by your health care provider. °3. Remove the needle cap by pulling it straight off the syringe. °4. When using a prefilled syringe, do not push the air bubble out of the syringe before the injection. The air bubble will help you get all of the medicine out of the syringe. °5. Hold the syringe like a pencil using your writing hand. °6. Use your other hand to pinch and hold an inch of the cleansed skin. °7. Insert the entire needle straight down into the fold of skin. °8. Push the plunger with your thumb until the syringe is empty. °9. Pull the needle straight out of your skin. °10. Enoxaparin injection prefilled syringes and graduated prefilled syringes are available with a system that shields the needle after injection. After you have completed your injection and removed the needle from your skin, firmly push down on the plunger. The protective sleeve will automatically cover the needle and you  will hear a click. The click means the needle is safely covered. °11. Place the syringe in the nearest needle box, also called a sharps container. If you do not have a sharps container, you can use a hard-sided plastic container with a secure lid, such as an empty laundry detergent bottle. °What else do I need to know? °· Do not use enoxaparin if: °¨ You have allergies to heparin or pork products. °¨ You have been diagnosed with a condition called thrombocytopenia. °· Do not use the syringe or needle more than one time. °· Use medicines only as directed by your health care provider. °· Changes in medicines, supplements, diet, and illness can affect your anticoagulation therapy. Be sure to inform your health care provider of any of these changes. °· It is important that you tell all of your health care providers and your dentist that you are taking an anticoagulant, especially if you are injured or plan to have any type of procedure. °· While on anticoagulants, you will need to have blood tests done routinely as directed by your health care provider. °· While using this medicine, avoid physical activities or sports that could result in a fall or cause injury. °· Follow up with your laboratory test and health care provider appointments as directed. It is very important to keep your appointments. Not keeping appointments could result in a chronic or permanent injury, pain, or disability. °· Before giving your medicine, you should make sure the injection is a clear and colorless or pale yellow solution. If your medicine becomes discolored or if there are particles in the syringe, do not use   it and notify your health care provider. °· Keep your medicine safely stored at room temperature. °Contact a health care provider if: °· You develop any rashes on your skin. °· You have large areas of bruising on your skin. °· You have any worsening of the condition for which you take Enoxaparin. °· You develop a fever. °Get help  right away if: °· You develop bleeding problems such as: °¨ Bleeding from the gums or nose that does not stop quickly. °¨ Vomiting blood or coughing up blood. °¨ Blood in your urine. °¨ Blood in your stool, or stool that has a dark, tarry, or coffee grounds appearance. °¨ A cut that does not stop bleeding within 10 minutes. °These symptoms may represent a serious problem that is an emergency. Do not wait to see if the symptoms will go away. Get medical help right away. Call your local emergency services (911 in the [place] Do not drive yourself to the hospital.  °This information is not intended to replace advice given to you by your health care provider. Make sure you discuss any questions you have with your health care provider. °Document Released: 03/17/2004 Document Revised: 01/21/2016 Document Reviewed: 10/31/2013 °[employer] Interactive Patient Education © 2017 [employer] ° °

## 2016-04-15 NOTE — Progress Notes (Signed)
[facility] OFFICE PROGRESS NOTE  Martha ClanShaw, William, MD SUMMARY OF HEMATOLOGIC HISTORY: Recurrent first trimester miscarriages, borderline low protein S level, MTHFR mutation  HISTORY OF PRESENTING ILLNESS:  Sophia Beard is here because of history of recurrent miscarriages. This patient has 2 successful pregnancies in the past. She has 2 daughters, age 35 and 349. Both daughters were born full-term from spontaneous vaginal deliveries. The patient had history of Crohn's colitis and underwent partial colectomy in November 2015. Since then, she had relatively stable disease with only 2 loose stool per day without passage of blood or mucus. Starting around March of this year, she had an early trimester miscarriage at about 9 weeks pregnancy. According to the patient, she started to have vaginal bleeding at about 5 weeks and subsequently had spontaneous loss of pregnancy. She became pregnant again around May of this year. She suspect that she may be around 4-[redacted] weeks pregnant. She said it has significant vaginal spotting followed by heavy menstrual flow. She was seen by her gynecologist who ordered multiple blood tests around 10/22/15. On review of her test results, she was found to have borderline low protein S level. Lupus anticoagulant, prothrombin gene mutation study, anti-thrombin III level, protein C level, factor V Leiden mutation study were all within normal limits. I do not have copy of her test result related to MTHFR mutation but the patient was told that she has 2 allelic mutations and was prescribed high-dose folic acid supplement. She also started to take 81 mg aspirin therapy daily. There were no reported family history of recurrent miscarriages in her mother. The patient has never been diagnosed with diagnosis of blood clots. She has not taken any birth control pills since teenage years. 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 She remain on aspirin therapy INTERVAL HISTORY: Sophia Beard 35 y.o. female returns for follow-up because she found out she is currently [redacted] weeks pregnant. Her last menstrual period was on 03/12/2016. She had mild spotting. Estimated due date would be around 12/17/2016. She is hopeful for spontaneous vaginal birth with planned induction around her expected due date. Otherwise she feels well. Denies recent infection. She is compliant taking aspirin and prenatal vitamins  I have reviewed the past medical history, past surgical history, social history and family history with the patient and they are unchanged from previous note.  ALLERGIES:  is allergic to iron dextran and sulfonamide derivatives.  MEDICATIONS:  Current Outpatient Prescriptions  Medication Sig Dispense Refill  . aspirin 81 MG tablet Take 81 mg by mouth daily.    Marland Kitchen. buPROPion (WELLBUTRIN XL) 150 MG 24 hr tablet Take 150 mg by mouth every morning.     . Cyanocobalamin (VITAMIN B12 PO) Take 3,000 mcg by mouth daily.    Marland Kitchen. dicyclomine (BENTYL) 10 MG capsule Take 10 mg by mouth 3 (three) times daily as needed for spasms (stomach spasms).    . folic acid (FOLVITE) 1 MG tablet Take 1 mg by mouth daily.    . ondansetron (ZOFRAN-ODT) 4 MG disintegrating tablet DISSOLVE 1 TABLET IN MOUTH EVERY 8 HOURS AS NEEDED FOR NAUSEA AND VOMITING 30 tablet 0  . Prenatal Vit-Fe Fumarate-FA (PRENATAL PLUS/IRON) 27-1 MG TABS Take 1 tablet by mouth daily.     Marland Kitchen. enoxaparin (LOVENOX) 40 MG/0.4ML injection Inject 0.4 mLs (40 mg total) into the skin daily. 30 Syringe 10   No current facility-administered medications for this visit.  REVIEW OF SYSTEMS:   Constitutional: Denies fevers, chills or night sweats Eyes: Denies blurriness of vision Ears, nose, mouth, throat, and face: Denies mucositis or sore throat Respiratory: Denies cough, dyspnea or wheezes Cardiovascular: Denies palpitation, chest discomfort or lower extremity  swelling Gastrointestinal:  Denies nausea, heartburn or change in bowel habits Skin: Denies abnormal skin rashes Lymphatics: Denies new lymphadenopathy or easy bruising Neurological:Denies numbness, tingling or new weaknesses Behavioral/Psych: Mood is stable, no new changes  All other systems were reviewed with the patient and are negative.  PHYSICAL EXAMINATION: ECOG PERFORMANCE STATUS: 0 - Asymptomatic  Vitals:   04/15/16 1412  BP: 127/85  Pulse: 92  Resp: 18  Temp: 98.4 F (36.9 C)   Filed Weights   04/15/16 1412  Weight: 151 lb 4.8 oz (68.6 kg)    GENERAL:alert, no distress and comfortable SKIN: skin color, texture, turgor are normal, no rashes or significant lesions EYES: normal, Conjunctiva are pink and non-injected, sclera clear Musculoskeletal:no cyanosis of digits and no clubbing  NEURO: alert & oriented x 3 with fluent speech, no focal motor/sensory deficits  LABORATORY DATA:  I have reviewed the data as listed     Component Value Date/Time   NA 136 04/15/2016 1353   K 3.5 04/15/2016 1353   CL 104 07/03/2015 0934   CO2 21 (L) 04/15/2016 1353   GLUCOSE 87 04/15/2016 1353   BUN 6.9 (L) 04/15/2016 1353   CREATININE 0.8 04/15/2016 1353   CALCIUM 9.1 04/15/2016 1353   PROT 7.7 07/03/2015 0934   ALBUMIN 4.2 07/03/2015 0934   AST 22 07/03/2015 0934   ALT 25 07/03/2015 0934   ALKPHOS 69 07/03/2015 0934   BILITOT 0.5 07/03/2015 0934   GFRNONAA >90 04/22/2014 0420   GFRAA >90 04/22/2014 0420    No results found for: SPEP, UPEP  Lab Results  Component Value Date   WBC 9.5 04/15/2016   NEUTROABS 6.7 (H) 04/15/2016   HGB 14.2 04/15/2016   HCT 42.2 04/15/2016   MCV 86.1 04/15/2016   PLT 326 04/15/2016      Chemistry      Component Value Date/Time   NA 136 04/15/2016 1353   K 3.5 04/15/2016 1353   CL 104 07/03/2015 0934   CO2 21 (L) 04/15/2016 1353   BUN 6.9 (L) 04/15/2016 1353   CREATININE 0.8 04/15/2016 1353      Component Value Date/Time    CALCIUM 9.1 04/15/2016 1353   ALKPHOS 69 07/03/2015 0934   AST 22 07/03/2015 0934   ALT 25 07/03/2015 0934   BILITOT 0.5 07/03/2015 0934      ASSESSMENT & PLAN:  High risk pregnancy due to recurrent miscarriage It is very peculiar that she had 2 successful pregnancies in the past. She had recent recurrent miscarriages that could be related to her age or possibility of scar tissues related to prior colon resection from Crohn's disease. She was diagnosed with MTHFR mutation recently along with protein S deficiencies. Her risks of recurrent miscarriages very high. I recommend that we start her on prophylactic dose of Lovenox 40 mg subcutaneous daily until after delivery. Alternatives would be to start her on unfractionated heparin but that would be given twice a day We discussed some of the risks, benefit, side effects of low molecular weight heparin including rare allergic reaction, bleeding, bruising and cost and she agreed to proceed I will get my nursing staff to teach her Lovenox injection.  I plan to see her back in 6 months with repeat blood  work and exam. If her Lovenox is approved, I recommend she will stop aspirin therapy  MTHFR mutation [facility]) The patient was told that she had MTHFR mutation although I do not have copy of the test results. This could certainly predispose her to risks of recurrent miscarriages. I recommend she takes high-dose folic acid   Orders Placed This Encounter  Procedures  . CBC with Differential/Platelet    Standing Status:   Future    Standing Expiration Date:   05/20/2017  . Comprehensive metabolic panel    Standing Status:   Future    Standing Expiration Date:   05/20/2017    All questions were answered. The patient knows to call the clinic with any problems, questions or concerns. No barriers to learning was detected.  I spent 25 minutes counseling the patient face to face. The total time spent in the appointment was 30 minutes and more than  50% was on counseling.     Artis Delay, MD 11/17/20173:55 PM

## 2016-04-15 NOTE — Progress Notes (Signed)
Pt instructed on Lovenox injections, handout given on procedure and location of injection sites.  Demonstration   Given  On proper injection methods. Pt and S/O verbalized  Understanding of instructions, questions answered. Instructed to call center if questions or issues occur.

## 2016-04-15 NOTE — Assessment & Plan Note (Signed)
It is very peculiar that she had 2 successful pregnancies in the past. She had recent recurrent miscarriages that could be related to her age or possibility of scar tissues related to prior colon resection from Crohn's disease. She was diagnosed with MTHFR mutation recently along with protein S deficiencies. Her risks of recurrent miscarriages very high. I recommend that we start her on prophylactic dose of Lovenox 40 mg subcutaneous daily until after delivery. Alternatives would be to start her on unfractionated heparin but that would be given twice a day We discussed some of the risks, benefit, side effects of low molecular weight heparin including rare allergic reaction, bleeding, bruising and cost and she agreed to proceed I will get my nursing staff to teach her Lovenox injection.  I plan to see her back in 6 months with repeat blood work and exam. If her Lovenox is approved, I recommend she will stop aspirin therapy

## 2016-04-18 ENCOUNTER — Telehealth: Payer: Self-pay | Admitting: General Practice

## 2016-04-18 NOTE — Telephone Encounter (Signed)
Left msg regarding 10/03/2016 appt.

## 2016-04-28 ENCOUNTER — Telehealth: Payer: Self-pay | Admitting: Gastroenterology

## 2016-04-28 NOTE — Telephone Encounter (Signed)
Pain after eating. She feels nauseated and has the urge to move her bowels. Once she moves her bowels, she begins to feel better. Soft stools, no blood and will move 2 to 3 times to feel relief. The relief is not immediate but takes 30 minutes to an hour. No vaginal blood. OB ultrasound is scheduled for tomorrow. Appointment made here for evaluation next week. She is in close contact with her OB due to the high risk pregnancy. She is not on any IBD medications.

## 2016-04-28 NOTE — Telephone Encounter (Signed)
Got voicemail. Left message to contact her OB-GYN and also to call us back.

## 2016-04-28 NOTE — Telephone Encounter (Signed)
Scheduled for the first available 05/04/16

## 2016-04-28 NOTE — Telephone Encounter (Signed)
Please schedule patient for office visit for evaluation soon. Thanks

## 2016-04-29 DIAGNOSIS — Z3A01 Less than 8 weeks gestation of pregnancy: Secondary | ICD-10-CM | POA: Diagnosis not present

## 2016-04-29 DIAGNOSIS — O09291 Supervision of pregnancy with other poor reproductive or obstetric history, first trimester: Secondary | ICD-10-CM | POA: Diagnosis not present

## 2016-05-02 ENCOUNTER — Telehealth: Payer: Self-pay | Admitting: *Deleted

## 2016-05-02 NOTE — Telephone Encounter (Signed)
Pt notified of Dr Bertis RuddyGorsuch message.

## 2016-05-02 NOTE — Telephone Encounter (Signed)
No need dose adjustment Agree with OB recommendation

## 2016-05-02 NOTE — Telephone Encounter (Signed)
Pt left message stating she is planning a trip in January to [place] (9 1/2 hour drive). OB suggested using compression stockings and stopping every hour. He also wanted her to check with Dr Bertis Ruddy to see if dose of Lovenox should be adjusted.

## 2016-05-04 ENCOUNTER — Other Ambulatory Visit (INDEPENDENT_AMBULATORY_CARE_PROVIDER_SITE_OTHER): Payer: BLUE CROSS/BLUE SHIELD

## 2016-05-04 ENCOUNTER — Encounter: Payer: Self-pay | Admitting: Gastroenterology

## 2016-05-04 ENCOUNTER — Ambulatory Visit (INDEPENDENT_AMBULATORY_CARE_PROVIDER_SITE_OTHER): Payer: BLUE CROSS/BLUE SHIELD | Admitting: Gastroenterology

## 2016-05-04 VITALS — BP 124/80 | HR 83 | Ht 60.75 in | Wt 150.0 lb

## 2016-05-04 DIAGNOSIS — R110 Nausea: Secondary | ICD-10-CM | POA: Insufficient documentation

## 2016-05-04 DIAGNOSIS — R1011 Right upper quadrant pain: Secondary | ICD-10-CM | POA: Diagnosis not present

## 2016-05-04 DIAGNOSIS — K5000 Crohn's disease of small intestine without complications: Secondary | ICD-10-CM

## 2016-05-04 LAB — C-REACTIVE PROTEIN: CRP: 0.3 mg/dL — ABNORMAL LOW (ref 0.5–20.0)

## 2016-05-04 NOTE — Patient Instructions (Addendum)
Your physician has requested that you go to the basement for the following lab work before leaving today:CRP.   You have been [facility] abdominal [facility]1st [facility] of hospital) on 05/11/16 at 8:00am. Please arrive 15 minutes prior to your appointment for registration. Make certain not to have anything to eat or drink 6 hours prior to your appointment. Should you need to reschedule your appointment, please contact radiology at [phone]. This test typically takes about 30 minutes to perform.

## 2016-05-04 NOTE — Progress Notes (Signed)
     05/04/2016 Sophia Beard [id number] 12-25-1980   History of Present Illness:  This is a 35 year old female with history of Crohn's disease predominantly stricturing disease of the terminal ileum, status post resection of terminal ileum in 2015.  Known to Dr. Lavon Paganini.  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  Not on meds for her Crohn's.  Is [redacted] weeks pregnant.  She is here today with complaints of predominately RUQ abdominal pain.  It is quite intense when it occurs.  Started last week and has been coming in episodes.  Says that she was eating more fruits and vegetables since she found out that she was pregnant.  She says it feels like when she had an obstruction before, but it lasts at most a couple of hours and then is followed by diarrhea before symptoms all resolve.  Has nausea with it as well.  Occurs after meals.  ? Recurrent stricturing with obstruction vs active disease vs maybe gallbladder issue (gallbladder was evaluated in the past).  She understands that we are limited in our testing at this point.  She says that she wants to try to avoid invasive testing but also does not think that she can continue experiencing these episodes. She says that she's had 3 bad episodes within the past week. She's currently on Lovenox during her pregnancy due to her protein S deficiency.    Current Medications, Allergies, Past Medical History, Past Surgical History, Family History and Social History were reviewed in [employer] record.   Physical Exam: BP 124/80   Pulse 83   Ht 5' 0.75" (1.543 m)   Wt 150 lb (68 kg)   SpO2 93%   BMI 28.58 kg/m  General: Well developed white female in no acute distress Head: Normocephalic and atraumatic Eyes:  Sclerae anicteric, conjunctiva pink  Ears: Normal auditory acuity Lungs: Clear throughout to  auscultation Heart: Regular rate and rhythm Abdomen: Soft, non -distended.  Normal bowel sounds.  RUQ TTP. Musculoskeletal: Symmetrical with no gross deformities  Extremities: No edema  Neurological: Alert oriented x 4, grossly non-focal Psychological:  Alert and cooperative. Normal mood and affect  Assessment and Recommendations: -35 year old female with history of Crohn's disease predominantly stricturing disease of the terminal ileum, status post resection of terminal ileum in 2015.  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  Not on meds for her Crohn's.  Is [redacted] weeks pregnant.  She is here today with complaints of predominately RUQ abdominal pain.  It is quite intense when it occurs.  She says it feels like when she had an obstruction before, but it lasts at most a couple of hours and then is followed by diarrhea before symptoms all resolve.  ? Recurrent stricturing with obstruction vs active disease vs maybe gallbladder issue (gallbladder was evaluated in the past).  We are limited right now in regards to imaging, etc.  Will check a CRP level as well as an abdominal ultrasound.  Should try to follow a low residue/low fiber diet.

## 2016-05-11 ENCOUNTER — Ambulatory Visit (HOSPITAL_COMMUNITY): Payer: BLUE CROSS/BLUE SHIELD

## 2016-05-11 NOTE — Progress Notes (Signed)
Reviewed and agree with documentation and assessment and plan. If continues to have persistent symptoms secondary to stricturing Crohn's disease or markedly elevated CRP, we'll need to discuss with OB regarding starting biologic (Cimzia will be the medication of choice)  K. Scherry Ran , MD

## 2016-05-12 DIAGNOSIS — O09511 Supervision of elderly primigravida, first trimester: Secondary | ICD-10-CM | POA: Diagnosis not present

## 2016-05-12 DIAGNOSIS — Z3480 Encounter for supervision of other normal pregnancy, unspecified trimester: Secondary | ICD-10-CM | POA: Diagnosis not present

## 2016-05-12 DIAGNOSIS — Z3689 Encounter for other specified antenatal screening: Secondary | ICD-10-CM | POA: Diagnosis not present

## 2016-05-12 DIAGNOSIS — Z23 Encounter for immunization: Secondary | ICD-10-CM | POA: Diagnosis not present

## 2016-05-13 ENCOUNTER — Ambulatory Visit (HOSPITAL_COMMUNITY): Payer: BLUE CROSS/BLUE SHIELD

## 2016-05-31 DIAGNOSIS — O09291 Supervision of pregnancy with other poor reproductive or obstetric history, first trimester: Secondary | ICD-10-CM | POA: Diagnosis not present

## 2016-05-31 DIAGNOSIS — Z36 Encounter for antenatal screening for chromosomal anomalies: Secondary | ICD-10-CM | POA: Diagnosis not present

## 2016-05-31 DIAGNOSIS — O09529 Supervision of elderly multigravida, unspecified trimester: Secondary | ICD-10-CM | POA: Diagnosis not present

## 2016-05-31 DIAGNOSIS — Z3689 Encounter for other specified antenatal screening: Secondary | ICD-10-CM | POA: Diagnosis not present

## 2016-05-31 DIAGNOSIS — O09521 Supervision of elderly multigravida, first trimester: Secondary | ICD-10-CM | POA: Diagnosis not present

## 2016-05-31 DIAGNOSIS — Z3A10 10 weeks gestation of pregnancy: Secondary | ICD-10-CM | POA: Diagnosis not present

## 2016-06-02 ENCOUNTER — Ambulatory Visit (HOSPITAL_COMMUNITY): Payer: BLUE CROSS/BLUE SHIELD

## 2016-06-16 ENCOUNTER — Encounter (HOSPITAL_COMMUNITY): Payer: Self-pay | Admitting: *Deleted

## 2016-06-16 DIAGNOSIS — O021 Missed abortion: Secondary | ICD-10-CM | POA: Diagnosis not present

## 2016-06-16 NOTE — Anesthesia Preprocedure Evaluation (Addendum)
Anesthesia Evaluation  Patient identified by MRN, date of birth, ID band Patient awake    Reviewed: Allergy & Precautions, NPO status , Patient's Chart, lab work & pertinent test results  History of Anesthesia Complications (+) PONV, Family history of anesthesia reaction and history of anesthetic complications  Airway Mallampati: II  TM Distance: >3 FB Neck ROM: Full    Dental no notable dental hx. (+) Teeth Intact   Pulmonary asthma ,    Pulmonary exam normal breath sounds clear to auscultation       Cardiovascular + Peripheral Vascular Disease   Rhythm:Regular Rate:Normal  Raynaud's disease   Neuro/Psych  Headaches, PSYCHIATRIC DISORDERS Anxiety Depression    GI/Hepatic Hx/ Crohn's Disease   Endo/Other    Renal/GU   negative genitourinary   Musculoskeletal   Abdominal   Peds  Hematology  (+) Blood dyscrasia, anemia , Protein S deficiency MTHFR Gene mutation Lovenox Rx- last dose   Anesthesia Other Findings Orthodontic retainer  Reproductive/Obstetrics (+) Pregnancy Missed Ab                            Anesthesia Physical Anesthesia Plan  ASA: II  Anesthesia Plan: MAC   Post-op Pain Management:    Induction: Intravenous  Airway Management Planned: Natural Airway and Nasal Cannula  Additional Equipment:   Intra-op Plan:   Post-operative Plan:   Informed Consent: I have reviewed the patients History and Physical, chart, labs and discussed the procedure including the risks, benefits and alternatives for the proposed anesthesia with the patient or authorized representative who has indicated his/her understanding and acceptance.     Plan Discussed with: Anesthesiologist, CRNA and Surgeon  Anesthesia Plan Comments: (GETA if needed)       Anesthesia Quick Evaluation

## 2016-06-17 ENCOUNTER — Encounter (HOSPITAL_COMMUNITY)
Admission: AD | Disposition: A | Discharge status: Home / Self Care | Payer: Self-pay | Source: Ambulatory Visit | Attending: Obstetrics and Gynecology

## 2016-06-17 ENCOUNTER — Ambulatory Visit (HOSPITAL_COMMUNITY): Payer: BLUE CROSS/BLUE SHIELD | Admitting: Anesthesiology

## 2016-06-17 ENCOUNTER — Encounter (HOSPITAL_COMMUNITY): Payer: Self-pay

## 2016-06-17 ENCOUNTER — Ambulatory Visit (HOSPITAL_COMMUNITY)
Admission: AD | Admit: 2016-06-17 | Discharge: 2016-06-17 | Disposition: A | Discharge status: Home / Self Care | Payer: BLUE CROSS/BLUE SHIELD | Source: Ambulatory Visit | Attending: Obstetrics and Gynecology | Admitting: Obstetrics and Gynecology

## 2016-06-17 ENCOUNTER — Ambulatory Visit (HOSPITAL_COMMUNITY): Payer: BLUE CROSS/BLUE SHIELD

## 2016-06-17 DIAGNOSIS — F419 Anxiety disorder, unspecified: Secondary | ICD-10-CM | POA: Insufficient documentation

## 2016-06-17 DIAGNOSIS — Z888 Allergy status to other drugs, medicaments and biological substances status: Secondary | ICD-10-CM | POA: Diagnosis not present

## 2016-06-17 DIAGNOSIS — F329 Major depressive disorder, single episode, unspecified: Secondary | ICD-10-CM | POA: Insufficient documentation

## 2016-06-17 DIAGNOSIS — O021 Missed abortion: Secondary | ICD-10-CM | POA: Diagnosis not present

## 2016-06-17 DIAGNOSIS — Z882 Allergy status to sulfonamides status: Secondary | ICD-10-CM | POA: Diagnosis not present

## 2016-06-17 DIAGNOSIS — Z3A12 12 weeks gestation of pregnancy: Secondary | ICD-10-CM | POA: Diagnosis not present

## 2016-06-17 DIAGNOSIS — I7300 Raynaud's syndrome without gangrene: Secondary | ICD-10-CM | POA: Insufficient documentation

## 2016-06-17 DIAGNOSIS — E7212 Methylenetetrahydrofolate reductase deficiency: Secondary | ICD-10-CM | POA: Diagnosis not present

## 2016-06-17 DIAGNOSIS — J45990 Exercise induced bronchospasm: Secondary | ICD-10-CM | POA: Diagnosis not present

## 2016-06-17 DIAGNOSIS — O029 Abnormal product of conception, unspecified: Secondary | ICD-10-CM

## 2016-06-17 DIAGNOSIS — D649 Anemia, unspecified: Secondary | ICD-10-CM | POA: Insufficient documentation

## 2016-06-17 DIAGNOSIS — N96 Recurrent pregnancy loss: Secondary | ICD-10-CM | POA: Insufficient documentation

## 2016-06-17 DIAGNOSIS — I739 Peripheral vascular disease, unspecified: Secondary | ICD-10-CM | POA: Diagnosis not present

## 2016-06-17 DIAGNOSIS — K5090 Crohn's disease, unspecified, without complications: Secondary | ICD-10-CM | POA: Diagnosis not present

## 2016-06-17 DIAGNOSIS — Z7901 Long term (current) use of anticoagulants: Secondary | ICD-10-CM | POA: Insufficient documentation

## 2016-06-17 HISTORY — DX: Disease of blood and blood-forming organs, unspecified: D75.9

## 2016-06-17 HISTORY — DX: Headache: R51

## 2016-06-17 HISTORY — DX: Headache, unspecified: R51.9

## 2016-06-17 HISTORY — DX: Other primary thrombophilia: D68.59

## 2016-06-17 HISTORY — PX: DILATION AND EVACUATION: SHX1459

## 2016-06-17 LAB — CBC
HCT: 43.4 % (ref 36.0–46.0)
Hemoglobin: 15.6 g/dL — ABNORMAL HIGH (ref 12.0–15.0)
MCH: 30.1 pg (ref 26.0–34.0)
MCHC: 35.9 g/dL (ref 30.0–36.0)
MCV: 83.8 fL (ref 78.0–100.0)
Platelets: 302 10*3/uL (ref 150–400)
RBC: 5.18 MIL/uL — ABNORMAL HIGH (ref 3.87–5.11)
RDW: 13.1 % (ref 11.5–15.5)
WBC: 7.9 10*3/uL (ref 4.0–10.5)

## 2016-06-17 SURGERY — DILATION AND EVACUATION, UTERUS
Anesthesia: Monitor Anesthesia Care | Site: Vagina

## 2016-06-17 MED ORDER — MIDAZOLAM HCL 2 MG/2ML IJ SOLN
INTRAMUSCULAR | Status: DC | PRN
  Administered 2016-06-17: 2 mg via INTRAVENOUS

## 2016-06-17 MED ORDER — TRAMADOL HCL 50 MG PO TABS: 50.0000 mg | ORAL_TABLET | Freq: Four times a day (QID) | ORAL | 0 refills | Status: DC | PRN

## 2016-06-17 MED ORDER — BUPIVACAINE HCL (PF) 0.25 % IJ SOLN
INTRAMUSCULAR | Status: AC
  Filled 2016-06-17: qty 30

## 2016-06-17 MED ORDER — FENTANYL CITRATE (PF) 100 MCG/2ML IJ SOLN
INTRAMUSCULAR | Status: AC
  Administered 2016-06-17: 50 ug via INTRAVENOUS
  Filled 2016-06-17: qty 2

## 2016-06-17 MED ORDER — MEPERIDINE HCL 25 MG/ML IJ SOLN: 6.2500 mg | INTRAMUSCULAR | Status: DC | PRN

## 2016-06-17 MED ORDER — GLYCOPYRROLATE 0.2 MG/ML IJ SOLN
INTRAMUSCULAR | Status: DC | PRN
  Administered 2016-06-17: 0.1 mg via INTRAVENOUS

## 2016-06-17 MED ORDER — KETOROLAC TROMETHAMINE 30 MG/ML IJ SOLN
INTRAMUSCULAR | Status: DC | PRN
Start: 2016-06-17 — End: 2016-06-17
  Administered 2016-06-17: 30 mg via INTRAVENOUS

## 2016-06-17 MED ORDER — SCOPOLAMINE 1 MG/3DAYS TD PT72
MEDICATED_PATCH | TRANSDERMAL | Status: AC
  Administered 2016-06-17: 1.5 mg via TRANSDERMAL
  Filled 2016-06-17: qty 1

## 2016-06-17 MED ORDER — CHLOROPROCAINE HCL 1 % IJ SOLN
INTRAMUSCULAR | Status: AC
  Filled 2016-06-17: qty 30

## 2016-06-17 MED ORDER — PROPOFOL 10 MG/ML IV BOLUS
INTRAVENOUS | Status: AC
  Filled 2016-06-17: qty 40

## 2016-06-17 MED ORDER — SCOPOLAMINE 1 MG/3DAYS TD PT72
1.0000 | MEDICATED_PATCH | Freq: Once | TRANSDERMAL | Status: DC
  Administered 2016-06-17: 1.5 mg via TRANSDERMAL

## 2016-06-17 MED ORDER — CEFAZOLIN SODIUM-DEXTROSE 2-4 GM/100ML-% IV SOLN
2.0000 g | Freq: Once | INTRAVENOUS | Status: AC
  Administered 2016-06-17: 2 g via INTRAVENOUS

## 2016-06-17 MED ORDER — FENTANYL CITRATE (PF) 100 MCG/2ML IJ SOLN
INTRAMUSCULAR | Status: AC
  Filled 2016-06-17: qty 4

## 2016-06-17 MED ORDER — HYDROCODONE-ACETAMINOPHEN 7.5-325 MG PO TABS: 1.0000 | ORAL_TABLET | Freq: Once | ORAL | Status: DC | PRN

## 2016-06-17 MED ORDER — PROPOFOL 500 MG/50ML IV EMUL
INTRAVENOUS | Status: DC | PRN
  Administered 2016-06-17: 75 ug/kg/min via INTRAVENOUS

## 2016-06-17 MED ORDER — BUPIVACAINE HCL (PF) 0.25 % IJ SOLN
INTRAMUSCULAR | Status: DC | PRN
  Administered 2016-06-17: 20 mL

## 2016-06-17 MED ORDER — LACTATED RINGERS IV SOLN
INTRAVENOUS | Status: DC
  Administered 2016-06-17: 07:00:00 via INTRAVENOUS

## 2016-06-17 MED ORDER — DEXAMETHASONE SODIUM PHOSPHATE 10 MG/ML IJ SOLN
INTRAMUSCULAR | Status: AC
  Filled 2016-06-17: qty 1

## 2016-06-17 MED ORDER — LIDOCAINE HCL (CARDIAC) 20 MG/ML IV SOLN
INTRAVENOUS | Status: DC | PRN
  Administered 2016-06-17: 100 mg via INTRAVENOUS

## 2016-06-17 MED ORDER — METOCLOPRAMIDE HCL 5 MG/ML IJ SOLN: 10.0000 mg | Freq: Once | INTRAMUSCULAR | Status: DC | PRN

## 2016-06-17 MED ORDER — PROPOFOL 10 MG/ML IV BOLUS
INTRAVENOUS | Status: DC | PRN
  Administered 2016-06-17: 20 mg via INTRAVENOUS

## 2016-06-17 MED ORDER — ONDANSETRON HCL 4 MG/2ML IJ SOLN
INTRAMUSCULAR | Status: AC
  Filled 2016-06-17: qty 2

## 2016-06-17 MED ORDER — DEXAMETHASONE SODIUM PHOSPHATE 4 MG/ML IJ SOLN
INTRAMUSCULAR | Status: DC | PRN
  Administered 2016-06-17: 4 mg via INTRAVENOUS

## 2016-06-17 MED ORDER — ONDANSETRON HCL 4 MG/2ML IJ SOLN
INTRAMUSCULAR | Status: DC | PRN
  Administered 2016-06-17: 4 mg via INTRAVENOUS

## 2016-06-17 MED ORDER — FENTANYL CITRATE (PF) 100 MCG/2ML IJ SOLN
INTRAMUSCULAR | Status: DC | PRN
  Administered 2016-06-17: 100 ug via INTRAVENOUS

## 2016-06-17 MED ORDER — FENTANYL CITRATE (PF) 100 MCG/2ML IJ SOLN
25.0000 ug | INTRAMUSCULAR | Status: DC | PRN
  Administered 2016-06-17: 50 ug via INTRAVENOUS

## 2016-06-17 MED ORDER — MIDAZOLAM HCL 2 MG/2ML IJ SOLN
INTRAMUSCULAR | Status: AC
  Filled 2016-06-17: qty 2

## 2016-06-17 MED ORDER — LIDOCAINE HCL (CARDIAC) 20 MG/ML IV SOLN
INTRAVENOUS | Status: AC
  Filled 2016-06-17: qty 5

## 2016-06-17 SURGICAL SUPPLY — 19 items
CATH ROBINSON RED A/P 16FR (CATHETERS) ×2 IMPLANT
CLOTH BEACON ORANGE TIMEOUT ST (SAFETY) ×2 IMPLANT
DECANTER SPIKE VIAL GLASS SM (MISCELLANEOUS) ×2 IMPLANT
GLOVE BIO SURGEON STRL SZ7.5 (GLOVE) ×2 IMPLANT
GLOVE BIOGEL PI IND STRL 7.0 (GLOVE) ×1 IMPLANT
GLOVE BIOGEL PI INDICATOR 7.0 (GLOVE) ×1
GOWN STRL REUS W/TWL LRG LVL3 (GOWN DISPOSABLE) ×4 IMPLANT
KIT BERKELEY 1ST TRIMESTER 3/8 (MISCELLANEOUS) ×2 IMPLANT
NS IRRIG 1000ML POUR BTL (IV SOLUTION) ×2 IMPLANT
PACK VAGINAL MINOR WOMEN LF (CUSTOM PROCEDURE TRAY) ×2 IMPLANT
PAD OB MATERNITY 4.3X12.25 (PERSONAL CARE ITEMS) ×2 IMPLANT
PAD PREP 24X48 CUFFED NSTRL (MISCELLANEOUS) ×2 IMPLANT
SET BERKELEY SUCTION TUBING (SUCTIONS) ×2 IMPLANT
TOWEL OR 17X24 6PK STRL BLUE (TOWEL DISPOSABLE) ×4 IMPLANT
VACURETTE 10 RIGID CVD (CANNULA) IMPLANT
VACURETTE 12 RIGID CVD (CANNULA) ×1 IMPLANT
VACURETTE 7MM CVD STRL WRAP (CANNULA) IMPLANT
VACURETTE 8 RIGID CVD (CANNULA) IMPLANT
VACURETTE 9 RIGID CVD (CANNULA) IMPLANT

## 2016-06-17 NOTE — Op Note (Signed)
06/17/2016  8:06 AM  PATIENT:  Sophia Beard  36 y.o. female  PRE-OPERATIVE DIAGNOSIS:  missed abortion 12 weeks NL NIPS Recurrent pregnancy loss  POST-OPERATIVE DIAGNOSIS:  missed abortion 12 weeks same  PROCEDURE:  Procedure(s): DILATATION AND EVACUATION OPERATIVE ULTRASOUND GUIDANCE  SURGEON:  Surgeon(s): Olivia Mackie, MD  ASSISTANTS: none   ANESTHESIA:   local  ESTIMATED BLOOD LOSS: 50CC   DRAINS: none   LOCAL MEDICATIONS USED:  MARCAINE    and Amount: 20 ml  SPECIMEN:  Source of Specimen:  poc  DISPOSITION OF SPECIMEN:  PATHOLOGY  COUNTS:  YES  DICTATION #: [id number]  PLAN OF CARE: DC HOME  PATIENT DISPOSITION:  PACU - hemodynamically stable.

## 2016-06-17 NOTE — H&P (Signed)
Sophia Beard is an 36 y.o. female. D&E for MAB  Pertinent Gynecological History: Menses: flow is moderate Bleeding: dysfunctional uterine bleeding Contraception: none DES exposure: denies Blood transfusions: none Sexually transmitted diseases: no past history Previous GYN Procedures: DNC  Last mammogram: na Date: na Last pap: normal Date: 2017 OB History: G5, P2   Menstrual History: Menarche age: 69 Patient's last menstrual period was 03/14/2016 (exact date).    Past Medical History:  Diagnosis Date  . Anal fissure   . Anemia    not presently  . Anxiety   . Asthma    exersize induced  . Blood dyscrasia    on lovinox  . Crohn disease (HCC)   . Depression   . Family history of adverse reaction to anesthesia    mother / sister have nausea  . GI bleed   . Headache   . Ovarian cyst   . PONV (postoperative nausea and vomiting)   . Protein S deficiency (HCC) 11/2015  . Raynaud's disease   . Raynaud's disease     Past Surgical History:  Procedure Laterality Date  . LAPAROSCOPIC ILEOCECECTOMY N/A 04/21/2014   Procedure: LAPAROSCOPIC ASSISTED ILEOCYSTECTOMY;  Surgeon: Avel Peace, MD;  Location: [facility];  Service: General;  Laterality: N/A;  . WISDOM TOOTH EXTRACTION      Family History  Problem Relation Age of Onset  . Colon cancer Paternal Uncle   . Heart disease Paternal Grandfather   . Irritable bowel syndrome Sister     Social History:  reports that she has never smoked. She has never used smokeless tobacco. She reports that she does not drink alcohol or use drugs.  Allergies:  Allergies  Allergen Reactions  . Iron Dextran Anaphylaxis  . Sulfonamide Derivatives Hives    Prescriptions Prior to Admission  Medication Sig Dispense Refill Last Dose  . buPROPion (WELLBUTRIN XL) 150 MG 24 hr tablet Take 150 mg by mouth every morning.    06/17/2016 at 0520  . Cyanocobalamin (VITAMIN B12) 1000 MCG TBCR Take 3,000 mcg by mouth daily.   Past Week at Unknown time   . enoxaparin (LOVENOX) 40 MG/0.4ML injection Inject 0.4 mLs (40 mg total) into the skin daily. 30 Syringe 10 06/16/2016 at Unknown time  . folic acid (FOLVITE) 1 MG tablet Take 4 mg by mouth daily.    Past Week at Unknown time  . Prenatal Vit-Fe Fumarate-FA (PRENATAL PLUS/IRON) 27-1 MG TABS Take 1 tablet by mouth daily.    Past Week at Unknown time    Review of Systems  Constitutional: Negative.   All other systems reviewed and are negative.   Blood pressure (!) 129/93, pulse 99, temperature 97.9 F (36.6 C), temperature source Oral, resp. rate 16, height 5\' 2"  (1.575 m), weight 69.4 kg (153 lb), last menstrual period 03/14/2016, SpO2 100 %. Physical Exam  Nursing note and vitals reviewed. Constitutional: She is oriented to person, place, and time. She appears well-developed and well-nourished.  HENT:  Head: Normocephalic and atraumatic.  Neck: Normal range of motion. Neck supple.  Cardiovascular: Normal rate and regular rhythm.   Respiratory: Effort normal and breath sounds normal.  GI: Soft. Bowel sounds are normal.  Genitourinary: Vagina normal and uterus normal.  Neurological: She is alert and oriented to person, place, and time. She has normal reflexes.  Skin: Skin is warm and dry.  Psychiatric: She has a normal mood and affect.    Results for orders placed or performed during the hospital encounter of 06/17/16 (from  the past 24 hour(s))  CBC     Status: Abnormal   Collection Time: 06/17/16  6:08 AM  Result Value Ref Range   WBC 7.9 4.0 - 10.5 K/uL   RBC 5.18 (H) 3.87 - 5.11 MIL/uL   Hemoglobin 15.6 (H) 12.0 - 15.0 g/dL   HCT 40.9 81.1 - 91.4 %   MCV 83.8 78.0 - 100.0 fL   MCH 30.1 26.0 - 34.0 pg   MCHC 35.9 30.0 - 36.0 g/dL   RDW 78.2 95.6 - 21.3 %   Platelets 302 150 - 400 K/uL    No results found.  Assessment/Plan: MAB Thrombophilia D&E . Consent done.  Sophia Beard J 06/17/2016, 7:27 AM

## 2016-06-17 NOTE — Transfer of Care (Signed)
Immediate Anesthesia Transfer of Care Note  Patient: LENETTA PICHE  Procedure(s) Performed: Procedure(s): DILATATION AND EVACUATION (N/A)  Patient Location: PACU  Anesthesia Type:MAC  Level of Consciousness: awake, alert  and oriented  Airway & Oxygen Therapy: Patient Spontanous Breathing and Patient connected to nasal cannula oxygen  Post-op Assessment: Report given to RN and Post -op Vital signs reviewed and stable  Post vital signs: Reviewed and stable  Last Vitals:  Vitals:   06/17/16 0611  BP: (!) 129/93  Pulse: 99  Resp: 16  Temp: 36.6 C    Last Pain:  Vitals:   06/17/16 0611  TempSrc: Oral      Patients Stated Pain Goal: 5 (32/44/01 0272)  Complications: No apparent anesthesia complications

## 2016-06-17 NOTE — Progress Notes (Signed)
Patient seen and examined. Consent witnessed and signed. No changes noted. Update completed.Patient ID: Sophia Beard, female   DOB: 1981/05/02, 36 y.o.   MRN: [id number]

## 2016-06-17 NOTE — Discharge Instructions (Signed)

## 2016-06-17 NOTE — Anesthesia Postprocedure Evaluation (Signed)
Anesthesia Post Note  Patient: Sophia Beard  Procedure(s) Performed: Procedure(s) (LRB): DILATATION AND EVACUATION (N/A)  Patient location during evaluation: PACU Anesthesia Type: MAC Level of consciousness: awake and alert and oriented Pain management: pain level controlled Vital Signs Assessment: post-procedure vital signs reviewed and stable Respiratory status: spontaneous breathing, nonlabored ventilation and respiratory function stable Cardiovascular status: stable and blood pressure returned to baseline Postop Assessment: no signs of nausea or vomiting Anesthetic complications: no        Last Vitals:  Vitals:   06/17/16 0850 06/17/16 0900  BP:  117/69  Pulse: 70 70  Resp: 14 11  Temp: 36.7 C     Last Pain:  Vitals:   06/17/16 0850  TempSrc: Oral   Pain Goal: Patients Stated Pain Goal: 5 (06/17/16 1308)               Cletis Muma A.

## 2016-06-18 NOTE — Op Note (Signed)
Sophia Beard, Sophia Beard                 ACCOUNT NO.:  [id number]  MEDICAL RECORD NO.:  [id number]  LOCATION:  [facility]                          [facility]  PHYSICIAN:  Lovenia Kim, M.D.DATE OF BIRTH:  08/26/1980  DATE OF PROCEDURE: DATE OF DISCHARGE:                              OPERATIVE REPORT   PREOPERATIVE DIAGNOSES: 1. Thirteen weeks pregnancy loss. 2. Recurrent pregnancy loss. 3. Thrombophilia with protein S deficiency, on Lovenox.  POSTOPERATIVE DIAGNOSES: 1. Thirteen weeks pregnancy loss. 2. Recurrent pregnancy loss. 3. Thrombophilia with protein S deficiency, on Lovenox.  PROCEDURE:  Ultrasound-guided suction, dilatation, and evacuation.  SURGEON:  Lovenia Kim, M.D.  ASSISTANT:  None.  ANESTHESIA:  MAC and local.  ESTIMATED BLOOD LOSS:  50 mL.  COMPLICATIONS:  None.  DRAINS:  None.  COUNTS:  Correct.  DISPOSITION:  The patient to recovery in good condition.  SPECIMEN:  Tissue to pathology and also for chromosomal analysis.  BRIEF OPERATIVE NOTE:  After being apprised of risks of anesthesia, infection, bleeding, or surrounding organs, possible need for repair, delayed versus immediate complications to include bowel and bladder injury, possible need for repair, the patient was brought to the operating room, where she was administered IV sedation without difficulty, prepped and draped in a sterile fashion.  Catheterized until the bladder was empty.  Exam under anesthesia revealed a 12-14 weeks size anteflexed uterus.  No adnexal masses.  Ultrasound guidance done before the procedure reveals no evidence of fetal heart tones, confirming fetal demise.  At this time, cervix easily dilated up to a #37 Pratt dilator.  A 12 mm suction curette was placed.  Aspiration of products of conception was done without difficulty under ultrasound guidance.  Repeat suction curettage in a 4-quadrant method confirms cavity to be empty.  Good hemostasis noted.  Dilute  Marcaine solution was placed, 20 mL total standard paracervical block.  All instruments were removed.  Minimal blood loss noted.  The patient tolerated the procedure well and was transferred to recovery in good condition.     Lovenia Kim, M.D.     RJT/MEDQ  D:  06/17/2016  T:  06/17/2016  Job:  [id number]

## 2016-06-19 ENCOUNTER — Encounter (HOSPITAL_COMMUNITY): Payer: Self-pay | Admitting: Obstetrics and Gynecology

## 2016-06-27 ENCOUNTER — Telehealth: Payer: Self-pay

## 2016-06-27 NOTE — Telephone Encounter (Signed)
I am so sorry to hear this. Please tell patient to accept my condolences I have availability to talk to her on 2/20 at 3 pm. Can she come in that day to discuss? If yes, please place scheduling msg. She can stop Lovenox and resume aspirin for now

## 2016-06-27 NOTE — Telephone Encounter (Signed)
S/w pt and she is available 2/20. In basket sent.  She did stop lovenox after the procedure and is taking aspirin 81 mg daily.

## 2016-06-27 NOTE — Telephone Encounter (Signed)
Pt called to let Dr Bertis Ruddy know she had a miscarriage about 10 days ago. The baby was about 12-[redacted] weeks gestation. She stated the testing showed it was not a chromasome abnormality. The posiblity is a blood clot. She is wanting to talk with Dr Bertis Ruddy about if she should stop trying for a baby or if next time she gets pregnant to increase the dosage of lovenox.

## 2016-07-01 LAB — MICROARRAY TO WFUBMC

## 2016-07-12 LAB — TISSUE HYBRIDIZATION TO NCBH

## 2016-07-12 LAB — CHROMOSOME STD, POC(TISSUE)-NCBH

## 2016-07-18 DIAGNOSIS — N96 Recurrent pregnancy loss: Secondary | ICD-10-CM | POA: Diagnosis not present

## 2016-07-19 ENCOUNTER — Ambulatory Visit (HOSPITAL_BASED_OUTPATIENT_CLINIC_OR_DEPARTMENT_OTHER): Payer: BLUE CROSS/BLUE SHIELD | Admitting: Hematology and Oncology

## 2016-07-19 DIAGNOSIS — D6859 Other primary thrombophilia: Secondary | ICD-10-CM | POA: Diagnosis not present

## 2016-07-19 DIAGNOSIS — N96 Recurrent pregnancy loss: Secondary | ICD-10-CM

## 2016-07-21 ENCOUNTER — Encounter: Payer: Self-pay | Admitting: Hematology and Oncology

## 2016-07-21 NOTE — Assessment & Plan Note (Addendum)
I have extensive discussion with the patient. 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  According to the patient, inflammatory bowel disease is not presently active. Nevertheless, the patient is considered a high risk for recurrent pregnancy loss. Previously, she was taking 40 mg SQ Lovenox and it was not adequate to prevent recurrent miscarriages. We have extensive discussion regarding prophylactic anticoagulation therapy. Her OB/GYN has recommended therapeutic dose of Lovenox during pregnancy. I reviewed the guidelines with the patient. In the absence of personal history of thrombosis, full therapeutic dose of Lovenox is generally not recommended and could be associated with high risk of bleeding. The patient wanted to try to get pregnant right away. I am wondering whether they might be concurrent antiphospholipid antibody syndrome that could be playing a role in her recurrent pregnancy loss. 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 In any case, we discussed alternative to full dose therapeutic Lovenox. I recommend 81 mg aspirin twice a day in addition to prophylactic dose of Lovenox 40 mg daily. I recommend she start aspirin now and to add on Lovenox once she has clinical confirmation of pregnancy. We discussed risk of bleeding with dual antiplatelet agents and prophylactic Lovenox.  Her bleeding risk is estimated in the region of 5% or more. Even with the aggressive treatment recommendations above, there is no guarantee that she would not have pregnancy loss. I will see her back when she have clinical confirmation of pregnancy

## 2016-07-21 NOTE — Progress Notes (Signed)
[facility] OFFICE PROGRESS NOTE  Sophia Clan, MD SUMMARY OF HEMATOLOGIC HISTORY: Recurrent first trimester miscarriages, borderline low protein S level, MTHFR mutation  HISTORY OF PRESENTING ILLNESS:  Sophia Beard is here because of history of recurrent miscarriages. This patient has 2 successful pregnancies in the past. She has 2 daughters, age 36 and 25. Both daughters were born full-term from spontaneous vaginal deliveries. The patient had history of Crohn's colitis and underwent partial colectomy in November 2015. Since then, she had relatively stable disease with only 2 loose stool per day without passage of blood or mucus. Starting around March of this year, she had an early trimester miscarriage at about 9 weeks pregnancy. According to the patient, she started to have vaginal bleeding at about 5 weeks and subsequently had spontaneous loss of pregnancy. She became pregnant again around May of this year. She suspect that she may be around 4-[redacted] weeks pregnant. She said it has significant vaginal spotting followed by heavy menstrual flow. She was seen by her gynecologist who ordered multiple blood tests around 10/22/15. On review of her test results, she was found to have borderline low protein S level. Lupus anticoagulant, prothrombin gene mutation study, anti-thrombin III level, protein C level, factor V Leiden mutation study were all within normal limits. I do not have copy of her test result related to MTHFR mutation but the patient was told that she has 2 allelic mutations and was prescribed high-dose folic acid supplement. She also started to take 81 mg aspirin therapy daily. There were no reported family history of recurrent miscarriages in her mother. The patient has never been diagnosed with diagnosis of blood clots. She has not taken any birth control pills since teenage years. 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 She remain on aspirin therapy The patient subsequently became pregnant but unfortunately suffered from another pregnancy loss INTERVAL HISTORY: Sophia BARTLE 36 y.o. female returns for further follow-up. She is recovering from recent pregnancy loss. She came back specifically for treatment recommendation to prevent future recurrent pregnancy loss. She denies excessive bleeding recently. I have reviewed the past medical history, past surgical history, social history and family history with the patient and they are unchanged from previous note.  ALLERGIES:  is allergic to iron dextran and sulfonamide derivatives.  MEDICATIONS:  Current Outpatient Prescriptions  Medication Sig Dispense Refill  . buPROPion (WELLBUTRIN XL) 150 MG 24 hr tablet Take 150 mg by mouth every morning.     . Cyanocobalamin (VITAMIN B12) 1000 MCG TBCR Take 3,000 mcg by mouth daily.    . folic acid (FOLVITE) 1 MG tablet Take 4 mg by mouth daily.     . Prenatal Vit-Fe Fumarate-FA (PRENATAL PLUS/IRON) 27-1 MG TABS Take 1 tablet by mouth daily.     . ondansetron (ZOFRAN-ODT) 4 MG disintegrating tablet DIS 1 T UNDER THE TONGUE TID PRN  2   No current facility-administered medications for this visit.      REVIEW OF SYSTEMS:   Constitutional: Denies fevers, chills or night sweats Eyes: Denies blurriness of vision Ears, nose, mouth, throat, and face: Denies mucositis or sore throat Respiratory: Denies cough, dyspnea or wheezes Cardiovascular: Denies palpitation, chest discomfort or lower extremity swelling Gastrointestinal:  Denies nausea, heartburn or change in bowel habits Skin: Denies abnormal skin rashes Lymphatics: Denies new lymphadenopathy or easy bruising Neurological:Denies numbness, tingling or new weaknesses Behavioral/Psych: Mood is stable, no new changes  All other systems were reviewed with the patient and are negative.  PHYSICAL EXAMINATION: ECOG PERFORMANCE STATUS: 0 -  Asymptomatic  Vitals:   07/19/16 1424  BP: 128/90  Pulse: 88  Resp: 18  Temp: 98.6 F (37 C)   Filed Weights   07/19/16 1424  Weight: 152 lb 11.2 oz (69.3 kg)    GENERAL:alert, no distress and comfortable SKIN: skin color, texture, turgor are normal, no rashes or significant lesions EYES: normal, Conjunctiva are pink and non-injected, sclera clear Musculoskeletal:no cyanosis of digits and no clubbing  NEURO: alert & oriented x 3 with fluent speech, no focal motor/sensory deficits  LABORATORY DATA:  I have reviewed the data as listed     Component Value Date/Time   NA 136 04/15/2016 1353   K 3.5 04/15/2016 1353   CL 104 07/03/2015 0934   CO2 21 (L) 04/15/2016 1353   GLUCOSE 87 04/15/2016 1353   BUN 6.9 (L) 04/15/2016 1353   CREATININE 0.8 04/15/2016 1353   CALCIUM 9.1 04/15/2016 1353   PROT 7.7 07/03/2015 0934   ALBUMIN 4.2 07/03/2015 0934   AST 22 07/03/2015 0934   ALT 25 07/03/2015 0934   ALKPHOS 69 07/03/2015 0934   BILITOT 0.5 07/03/2015 0934   GFRNONAA >90 04/22/2014 0420   GFRAA >90 04/22/2014 0420    No results found for: SPEP, UPEP  Lab Results  Component Value Date   WBC 7.9 06/17/2016   NEUTROABS 6.7 (H) 04/15/2016   HGB 15.6 (H) 06/17/2016   HCT 43.4 06/17/2016   MCV 83.8 06/17/2016   PLT 302 06/17/2016      Chemistry      Component Value Date/Time   NA 136 04/15/2016 1353   K 3.5 04/15/2016 1353   CL 104 07/03/2015 0934   CO2 21 (L) 04/15/2016 1353   BUN 6.9 (L) 04/15/2016 1353   CREATININE 0.8 04/15/2016 1353      Component Value Date/Time   CALCIUM 9.1 04/15/2016 1353   ALKPHOS 69 07/03/2015 0934   AST 22 07/03/2015 0934   ALT 25 07/03/2015 0934   BILITOT 0.5 07/03/2015 0934      ASSESSMENT & PLAN:  History of recurrent miscarriages, not currently pregnant I have extensive discussion with the patient. 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  According to the  patient, inflammatory bowel disease is not presently active. Nevertheless, the patient is considered a high risk for recurrent pregnancy loss. Previously, she was taking 40 mg SQ Lovenox and it was not adequate to prevent recurrent miscarriages. We have extensive discussion regarding prophylactic anticoagulation therapy. Her OB/GYN has recommended therapeutic dose of Lovenox during pregnancy. I reviewed the guidelines with the patient. In the absence of personal history of thrombosis, full therapeutic dose of Lovenox is generally not recommended and could be associated with high risk of bleeding. The patient wanted to try to get pregnant right away. I am wondering whether they might be concurrent antiphospholipid antibody syndrome that could be playing a role in her recurrent pregnancy loss. 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 In any case, we discussed alternative to full dose therapeutic Lovenox. I recommend 81 mg aspirin twice a day in addition to prophylactic dose of Lovenox 40 mg daily. I recommend she start aspirin now and to add on Lovenox once she has clinical confirmation of pregnancy. We discussed risk of bleeding with dual antiplatelet agents and prophylactic Lovenox.  Her bleeding risk is estimated  in the region of 5% or more. Even with the aggressive treatment recommendations above, there is no guarantee that she would not have pregnancy loss. I will see her back when she have clinical confirmation of pregnancy    No orders of the defined types were placed in this encounter.   All questions were answered. The patient knows to call the clinic with any problems, questions or concerns. No barriers to learning was detected.  I spent 20 minutes counseling the patient face to face. The total time spent in the appointment was 30 minutes and more than 50% was on counseling.     Artis Delay, MD 2/22/20187:31 AM

## 2016-10-03 ENCOUNTER — Other Ambulatory Visit: Payer: BLUE CROSS/BLUE SHIELD

## 2016-10-03 ENCOUNTER — Ambulatory Visit: Payer: BLUE CROSS/BLUE SHIELD | Admitting: Hematology and Oncology

## 2017-03-03 DIAGNOSIS — Z23 Encounter for immunization: Secondary | ICD-10-CM | POA: Diagnosis not present

## 2017-07-10 DIAGNOSIS — Z1151 Encounter for screening for human papillomavirus (HPV): Secondary | ICD-10-CM | POA: Diagnosis not present

## 2017-07-10 DIAGNOSIS — Z01419 Encounter for gynecological examination (general) (routine) without abnormal findings: Secondary | ICD-10-CM | POA: Diagnosis not present

## 2017-07-10 DIAGNOSIS — Z6826 Body mass index (BMI) 26.0-26.9, adult: Secondary | ICD-10-CM | POA: Diagnosis not present

## 2017-09-11 ENCOUNTER — Ambulatory Visit: Payer: BLUE CROSS/BLUE SHIELD | Admitting: Podiatry

## 2017-09-11 ENCOUNTER — Encounter: Payer: Self-pay | Admitting: Podiatry

## 2017-09-11 DIAGNOSIS — L603 Nail dystrophy: Secondary | ICD-10-CM | POA: Diagnosis not present

## 2017-09-11 NOTE — Patient Instructions (Signed)

## 2017-09-15 NOTE — Progress Notes (Signed)
   Subjective: 37 year old female presenting today as a new patient with a chief complaint of thickening and discoloration of the right great toenail that has been ongoing for several years. She reports the nail began lifting from the nail bed three days ago. She states she had a nail procedure done 20 years ago which caused the symptoms to begin. She also reports associated discoloration in the bilateral feet. There are no modifying factors noted and she has not done anything for treatment. She denies pain. Patient is here for further evaluation and treatment.   Past Medical History:  Diagnosis Date  . Anal fissure   . Anemia    not presently  . Anxiety   . Asthma    exersize induced  . Blood dyscrasia    on lovinox  . Crohn disease (HCC)   . Depression   . Family history of adverse reaction to anesthesia    mother / sister have nausea  . GI bleed   . Headache   . Ovarian cyst   . PONV (postoperative nausea and vomiting)   . Protein S deficiency (HCC) 11/2015  . Raynaud's disease   . Raynaud's disease     Objective:  General: Well developed, nourished, in no acute distress, alert and oriented x3   Dermatology: Hyperkeratotic, discolored, thickened, onychodystrophy of the right great toenail. Skin is warm, dry and supple bilateral lower extremities. Negative for open lesions or macerations.  Vascular: Dorsalis Pedis artery and Posterior Tibial artery pedal pulses palpable. No lower extremity edema noted.   Neruologic: Grossly intact via light touch bilateral.  Musculoskeletal: Muscular strength within normal limits in all groups bilateral. Normal range of motion noted to all pedal and ankle joints.   Assessment:  #1 Dystrophic nail of the right great toe  Plan of Care:  1. Patient evaluated.  2. Discussed treatment alternatives and plan of care. Explained nail avulsion procedure and post procedure course to patient. 3. Patient opted for total temporary nail avulsion.  4.  Prior to procedure, local anesthesia infiltration utilized using 3 ml of a 50:50 mixture of 2% plain lidocaine and 0.5% plain marcaine in a normal hallux block fashion and a betadine prep performed.  5. Light dressing applied. 6. Return to clinic as needed.   Felecia Shelling, DPM [facility]  Dr. Felecia Shelling, DPM    [address]                                        [place], [place] [address]                Office [phone]  Fax [phone]

## 2018-03-02 DIAGNOSIS — Z23 Encounter for immunization: Secondary | ICD-10-CM | POA: Diagnosis not present

## 2018-05-04 ENCOUNTER — Telehealth: Payer: Self-pay

## 2018-05-04 NOTE — Telephone Encounter (Signed)
I will see her back if she has confirmed pregnancy

## 2018-05-04 NOTE — Telephone Encounter (Signed)
Called and given below message. She verbalized understanding. 

## 2018-05-04 NOTE — Telephone Encounter (Signed)
She called and left a message. She has seen Dr. Bertis Ruddy in the past. She is not pregnant and she is not trying to get pregnant. She saw her GYN recently and is not sure of she should be following up with Dr. Bertis Ruddy.

## 2018-08-03 DIAGNOSIS — Z6826 Body mass index (BMI) 26.0-26.9, adult: Secondary | ICD-10-CM | POA: Diagnosis not present

## 2018-08-03 DIAGNOSIS — Z01419 Encounter for gynecological examination (general) (routine) without abnormal findings: Secondary | ICD-10-CM | POA: Diagnosis not present

## 2019-08-07 DIAGNOSIS — N920 Excessive and frequent menstruation with regular cycle: Secondary | ICD-10-CM | POA: Diagnosis not present

## 2019-08-07 DIAGNOSIS — F419 Anxiety disorder, unspecified: Secondary | ICD-10-CM | POA: Diagnosis not present

## 2019-08-07 DIAGNOSIS — Z6827 Body mass index (BMI) 27.0-27.9, adult: Secondary | ICD-10-CM | POA: Diagnosis not present

## 2019-08-07 DIAGNOSIS — Z124 Encounter for screening for malignant neoplasm of cervix: Secondary | ICD-10-CM | POA: Diagnosis not present

## 2019-08-07 DIAGNOSIS — Z01419 Encounter for gynecological examination (general) (routine) without abnormal findings: Secondary | ICD-10-CM | POA: Diagnosis not present

## 2019-08-07 LAB — RESULTS CONSOLE HPV: CHL HPV: NEGATIVE

## 2019-08-07 LAB — HM PAP SMEAR

## 2019-08-16 ENCOUNTER — Ambulatory Visit: Payer: BC Managed Care – PPO | Attending: Internal Medicine

## 2019-08-16 ENCOUNTER — Other Ambulatory Visit: Payer: Self-pay

## 2019-08-16 DIAGNOSIS — Z23 Encounter for immunization: Secondary | ICD-10-CM

## 2019-08-16 NOTE — Progress Notes (Signed)
   Covid-19 Vaccination Clinic  Name:  KIYOMI PALLO    MRN: [id number] DOB: 01-31-1981  08/16/2019  Ms. Chad was observed post Covid-19 immunization for 30 minutes based on pre-vaccination screening without incident. She was provided with Vaccine Information Sheet and instruction to access the V-Safe system.   Ms. Mcmeans was instructed to call 911 with any severe reactions post vaccine: Marland Kitchen Difficulty breathing  . Swelling of face and throat  . A fast heartbeat  . A bad rash all over body  . Dizziness and weakness   Immunizations Administered    Name Date Dose VIS Date Route   Pfizer COVID-19 Vaccine 08/16/2019  3:33 PM 0.3 mL 05/10/2019 Intramuscular   Manufacturer: [employer], [employer]   Lot: [id number]   NDC: [address]-4

## 2019-09-11 ENCOUNTER — Ambulatory Visit: Payer: BC Managed Care – PPO

## 2019-09-17 ENCOUNTER — Ambulatory Visit: Payer: BC Managed Care – PPO

## 2019-09-18 ENCOUNTER — Ambulatory Visit: Payer: BC Managed Care – PPO | Attending: Internal Medicine

## 2019-09-18 DIAGNOSIS — Z23 Encounter for immunization: Secondary | ICD-10-CM

## 2019-09-18 NOTE — Progress Notes (Signed)
   Covid-19 Vaccination Clinic  Name:  Sophia Beard    MRN: [id number] DOB: [birth year]  09/18/2019  Ms. Sophia Beard was observed post Covid-19 immunization for 15 minutes without incident. She was provided with Vaccine Information Sheet and instruction to access the V-Safe system.   Ms. Sophia Beard was instructed to call 911 with any severe reactions post vaccine: Marland Kitchen Difficulty breathing  . Swelling of face and throat  . A fast heartbeat  . A bad rash all over body  . Dizziness and weakness   Immunizations Administered    Name Date Dose VIS Date Route   Pfizer COVID-19 Vaccine 09/18/2019 10:36 AM 0.3 mL 07/24/2018 Intramuscular   Manufacturer: [employer], [employer]   Lot: [id number]   NDC: [address]-2

## 2019-09-18 NOTE — Progress Notes (Signed)
   Covid-19 Vaccination Clinic  Name:  Sophia Beard    MRN: [id number] DOB: 04/28/1981  09/18/2019  [name] was observed post Covid-19 immunization for 15 minutes without incident. She was provided with Vaccine Information Sheet and instruction to access the V-Safe system.   [name] was instructed to call 911 with any severe reactions post vaccine: . Difficulty breathing  . Swelling of face and throat  . A fast heartbeat  . A bad rash all over body  . Dizziness and weakness   Immunizations Administered    Name Date Dose VIS Date Route   Pfizer COVID-19 Vaccine 09/18/2019 10:36 AM 0.3 mL 07/24/2018 Intramuscular   Manufacturer: Pfizer, Inc   Lot: [id number]   NDC: [address]-2      

## 2020-01-31 ENCOUNTER — Encounter: Payer: Self-pay | Admitting: Gastroenterology

## 2020-03-31 ENCOUNTER — Other Ambulatory Visit (INDEPENDENT_AMBULATORY_CARE_PROVIDER_SITE_OTHER): Payer: BC Managed Care – PPO

## 2020-03-31 ENCOUNTER — Ambulatory Visit: Payer: BC Managed Care – PPO | Admitting: Gastroenterology

## 2020-03-31 ENCOUNTER — Encounter: Payer: Self-pay | Admitting: Gastroenterology

## 2020-03-31 VITALS — BP 132/80 | HR 88 | Ht 62.0 in | Wt 150.0 lb

## 2020-03-31 DIAGNOSIS — K50919 Crohn's disease, unspecified, with unspecified complications: Secondary | ICD-10-CM

## 2020-03-31 DIAGNOSIS — R110 Nausea: Secondary | ICD-10-CM

## 2020-03-31 DIAGNOSIS — R1013 Epigastric pain: Secondary | ICD-10-CM

## 2020-03-31 DIAGNOSIS — K921 Melena: Secondary | ICD-10-CM | POA: Diagnosis not present

## 2020-03-31 LAB — COMPREHENSIVE METABOLIC PANEL
ALT: 17 U/L (ref 0–35)
AST: 14 U/L (ref 0–37)
Albumin: 4.4 g/dL (ref 3.5–5.2)
Alkaline Phosphatase: 76 U/L (ref 39–117)
BUN: 7 mg/dL (ref 6–23)
CO2: 22 mEq/L (ref 19–32)
Calcium: 9 mg/dL (ref 8.4–10.5)
Chloride: 102 mEq/L (ref 96–112)
Creatinine, Ser: 0.9 mg/dL (ref 0.40–1.20)
GFR: 80.83 mL/min (ref >60.00)
Glucose, Bld: 76 mg/dL (ref 70–99)
Potassium: 3.8 mEq/L (ref 3.5–5.1)
Sodium: 135 mEq/L (ref 135–145)
Total Bilirubin: 0.5 mg/dL (ref 0.2–1.2)
Total Protein: 7.7 g/dL (ref 6.0–8.3)

## 2020-03-31 LAB — CBC WITH DIFFERENTIAL/PLATELET
Basophils Absolute: 0.1 10*3/uL (ref 0.0–0.1)
Basophils Relative: 0.9 % (ref 0.0–3.0)
Eosinophils Absolute: 0.1 10*3/uL (ref 0.0–0.7)
Eosinophils Relative: 0.7 % (ref 0.0–5.0)
HCT: 38.5 % (ref 36.0–46.0)
Hemoglobin: 12.7 g/dL (ref 12.0–15.0)
Lymphocytes Relative: 18 % (ref 12.0–46.0)
Lymphs Abs: 1.8 10*3/uL (ref 0.7–4.0)
MCHC: 32.9 g/dL (ref 30.0–36.0)
MCV: 72 fl — ABNORMAL LOW (ref 78.0–100.0)
Monocytes Absolute: 0.6 10*3/uL (ref 0.1–1.0)
Monocytes Relative: 5.9 % (ref 3.0–12.0)
Neutro Abs: 7.4 10*3/uL (ref 1.4–7.7)
Neutrophils Relative %: 74.5 % (ref 43.0–77.0)
Platelets: 398 10*3/uL (ref 150.0–400.0)
RBC: 5.35 Mil/uL — ABNORMAL HIGH (ref 3.87–5.11)
RDW: 17 % — ABNORMAL HIGH (ref 11.5–15.5)
WBC: 9.9 10*3/uL (ref 4.0–10.5)

## 2020-03-31 LAB — VITAMIN B12: Vitamin B-12: 170 pg/mL — ABNORMAL LOW (ref 211–911)

## 2020-03-31 LAB — VITAMIN D 25 HYDROXY (VIT D DEFICIENCY, FRACTURES): VITD: 20.88 ng/mL — ABNORMAL LOW (ref 30.00–100.00)

## 2020-03-31 LAB — HIGH SENSITIVITY CRP: CRP, High Sensitivity: 4.28 mg/L (ref 0.000–5.000)

## 2020-03-31 LAB — FOLATE: Folate: 8.1 ng/mL (ref >5.9)

## 2020-03-31 LAB — SEDIMENTATION RATE: Sed Rate: 19 mm/hr (ref 0–20)

## 2020-03-31 LAB — FERRITIN: Ferritin: 6.6 ng/mL — ABNORMAL LOW (ref 10.0–291.0)

## 2020-03-31 MED ORDER — PANTOPRAZOLE SODIUM 40 MG PO TBEC: 40.0000 mg | DELAYED_RELEASE_TABLET | Freq: Every day | ORAL | 3 refills | Status: DC

## 2020-03-31 MED ORDER — BUDESONIDE 3 MG PO CPEP: 9.0000 mg | ORAL_CAPSULE | Freq: Every day | ORAL | 3 refills | Status: DC

## 2020-03-31 MED ORDER — SUPREP BOWEL PREP KIT 17.5-3.13-1.6 GM/177ML PO SOLN: ORAL | 0 refills | Status: DC

## 2020-03-31 MED ORDER — ONDANSETRON 4 MG PO TBDP: 4.0000 mg | ORAL_TABLET | Freq: Three times a day (TID) | ORAL | 0 refills | Status: DC | PRN

## 2020-03-31 NOTE — Progress Notes (Signed)
Sophia Beard    [id number]    [birth year]  Primary Care Physician:Shaw, Gwyndolyn Saxon, MD  Referring Physician: Marton Redwood, MD [address] [place],  [place] [address]   Chief complaint:  Crohn's disease  HPI:  39 year old very pleasant female here with complaints of worsening diarrhea and to discuss management of Crohn's disease  Last office visit in 2017  She has been having increased bowel frequency and abdominal cramping, progressively getting worse  She always has liquid diarrhea, on average 1-2 bowel movements a day and once every 2 to 3 weeks she has severe abdominal cramping that wakes her up at night and has 3-4 liquid bowel movements subsequently.  Every 2 to 3 weeks she has severe episode of diarrhea with 6-7 bowel movements per day.  Denies any blood in stool but has been having dark tarry stool intermittently  Takes Aleve on average once a week for headaches and also for menstrual cramping  Complains of epigastric abdominal pain and nausea  She has had 2 miscarriages.  Has history of Raynaud's disease and protein S deficiency.  She is currently not on any maintenance therapy for Crohn's disease.  She was on and off on Entocort and gentamicin in the past She was initially diagnosed with Crohn's disease, terminal ileal stricture s/p laparoscopically assisted ileal cecectomy for chronic small bowel obstruction on 04/21/2014 by Dr. Zella Richer.  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 Prior to the surgery and her CT scan of the abdomen in November 2015 showed 13 cm segment of terminal ileum with questionable abscess versus confined perforation.   Last colonoscopy was 2006 and showed Crohn's disease at the ileocecal valve there were granulomas and crypt abscesses as well.   Outpatient Encounter Medications as of 03/31/2020  Medication Sig  . aspirin EC 81 MG  tablet Take 81 mg by mouth daily.  Marland Kitchen buPROPion (WELLBUTRIN XL) 150 MG 24 hr tablet Take 150 mg by mouth every morning.    No facility-administered encounter medications on file as of 03/31/2020.    Allergies as of 03/31/2020 - Review Complete 03/31/2020  Allergen Reaction Noted  . Iron dextran Anaphylaxis 12/20/2007  . Sulfonamide derivatives Hives 12/17/2007    Past Medical History:  Diagnosis Date  . Anal fissure   . Anemia    not presently  . Anxiety   . Asthma    exersize induced  . Blood dyscrasia    on lovinox  . Crohn disease ([place])   . Depression   . Family history of adverse reaction to anesthesia    mother / sister have nausea  . GI bleed   . Headache   . Ovarian cyst   . PONV (postoperative nausea and vomiting)   . Protein S deficiency ([place]) 11/2015  . Raynaud's disease   . Raynaud's disease     Past Surgical History:  Procedure Laterality Date  . DILATION AND EVACUATION N/A 06/17/2016   Procedure: DILATATION AND EVACUATION;  Surgeon: Brien Few, MD;  Location: [facility];  Service: Gynecology;  Laterality: N/A;  . LAPAROSCOPIC ILEOCECECTOMY N/A 04/21/2014   Procedure: LAPAROSCOPIC ASSISTED ILEOCYSTECTOMY;  Surgeon: Jackolyn Confer, MD;  Location: [facility];  Service: General;  Laterality: N/A;  . WISDOM TOOTH EXTRACTION      Family History  Problem Relation Age of Onset  . Colon cancer Paternal Uncle   . Heart  disease Paternal Grandfather   . Irritable bowel syndrome Sister   . Hypertension Mother     Social History   Socioeconomic History  . Marital status: Married    Spouse name: Not on file  . Number of children: 2  . Years of education: Not on file  . Highest education level: Not on file  Occupational History  . Occupation: Forensic psychologist: [employer]  Tobacco Use  . Smoking status: Never Smoker  . Smokeless tobacco: Never Used  Substance and Sexual Activity  . Alcohol use: No  . Drug use: No  . Sexual activity: Yes     Birth control/protection: None    Comment: married, self employed as a Probation officer. Has 2 daughters  Other Topics Concern  . Not on file  Social History Narrative  . Not on file   Social Determinants of Health   Financial Resource Strain:   . Difficulty of Paying Living Expenses: Not on file  Food Insecurity:   . Worried About Charity fundraiser in the Last Year: Not on file  . Ran Out of Food in the Last Year: Not on file  Transportation Needs:   . Lack of Transportation (Medical): Not on file  . Lack of Transportation (Non-Medical): Not on file  Physical Activity:   . Days of Exercise per Week: Not on file  . Minutes of Exercise per Session: Not on file  Stress:   . Feeling of Stress : Not on file  Social Connections:   . Frequency of Communication with Friends and Family: Not on file  . Frequency of Social Gatherings with Friends and Family: Not on file  . Attends Religious Services: Not on file  . Active Member of Clubs or Organizations: Not on file  . Attends Archivist Meetings: Not on file  . Marital Status: Not on file  Intimate Partner Violence:   . Fear of Current or Ex-Partner: Not on file  . Emotionally Abused: Not on file  . Physically Abused: Not on file  . Sexually Abused: Not on file      Review of systems: All other review of systems negative except as mentioned in the HPI.   Physical Exam: Vitals:   03/31/20 1008  BP: 132/80  Pulse: 88   Body mass index is 27.44 kg/m. Gen:      No acute distress HEENT:  sclera anicteric Abd:      soft, non-tender; no palpable masses, no distension Ext:    No edema Neuro: alert and oriented x 3 Psych: normal mood and affect  Data Reviewed:  Reviewed labs, radiology imaging, old records and pertinent past GI work up   Assessment and Plan/Recommendations:  39 year old very pleasant female with history of Crohn's disease and endometriosis involving the appendix s/p ileocecectomy in 2015 with  complaints of worsening diarrhea, intermittent melena, nausea and epigastric abdominal pain  We will proceed with EGD and colonoscopy for further evaluation.  Will need to exclude active inflammation with Crohn's flare, peptic ulcer disease or fistula. The risks and benefits as well as alternatives of endoscopic procedure(s) have been discussed and reviewed. All questions answered. The patient agrees to proceed.  Check GI pathogen panel to exclude infectious etiology for diarrhea Check CBC, CMP, ESR, CRP  Check iron panel, B12, folate and vitamin D for IBD health maintenance  Plan to start Entocort 9 mg daily if no evidence of active GI infection once she submits GI pathogen panel  stool sample and is resulted  Start Protonix 40 mg daily and antireflux measures Zofran 4 mg daily as needed for nausea Avoid or limit use of NSAIDs  Further recommendation for management of Crohn's disease based on endoscopic findings  This visit required >60 minutes of patient care (this includes precharting, chart review, review of results, face-to-face time used for counseling as well as treatment plan and follow-up. The patient was provided an opportunity to ask questions and all were answered. The patient agreed with the plan and demonstrated an understanding of the instructions.  Damaris Hippo , MD    CC: Marton Redwood, MD

## 2020-03-31 NOTE — Patient Instructions (Addendum)
You have been scheduled for an endoscopy and colonoscopy. Please follow the written instructions given to you at your visit today. Please pick up your prep supplies at the pharmacy within the next 1-3 days. If you use inhalers (even only as needed), please bring them with you on the day of your procedure.   Due to recent changes in healthcare laws, you may see the results of your imaging and laboratory studies on MyChart before your provider has had a chance to review them.  We understand that in some cases there may be results that are confusing or concerning to you. Not all laboratory results come back in the same time frame and the provider may be waiting for multiple results in order to interpret others.  Please give [place] 48 hours in order for your provider to thoroughly review all the results before contacting the office for clarification of your results.   Your provider has requested that you go to the basement level for lab work before leaving today. Press "B" on the elevator. The lab is located at the first door on the left as you exit the elevator.  We have sent in Entocort,Protonix, Zofran and Suprep to your pharmacy today  AVOID NSAIDS   Gastroesophageal Reflux Disease, Adult Gastroesophageal reflux (GER) happens when acid from the stomach flows up into the tube that connects the mouth and the stomach (esophagus). Normally, food travels down the esophagus and stays in the stomach to be digested. However, when a person has GER, food and stomach acid sometimes move back up into the esophagus. If this becomes a more serious problem, the person may be diagnosed with a disease called gastroesophageal reflux disease (GERD). GERD occurs when the reflux:  Happens often.  Causes frequent or severe symptoms.  Causes problems such as damage to the esophagus. When stomach acid comes in contact with the esophagus, the acid may cause soreness (inflammation) in the esophagus. Over time, GERD may  create small holes (ulcers) in the lining of the esophagus. What are the causes? This condition is caused by a problem with the muscle between the esophagus and the stomach (lower esophageal sphincter, or LES). Normally, the LES muscle closes after food passes through the esophagus to the stomach. When the LES is weakened or abnormal, it does not close properly, and that allows food and stomach acid to go back up into the esophagus. The LES can be weakened by certain dietary substances, medicines, and medical conditions, including:  Tobacco use.  Pregnancy.  Having a hiatal hernia.  Alcohol use.  Certain foods and beverages, such as coffee, chocolate, onions, and peppermint. What increases the risk? You are more likely to develop this condition if you:  Have an increased body weight.  Have a connective tissue disorder.  Use NSAID medicines. What are the signs or symptoms? Symptoms of this condition include:  Heartburn.  Difficult or painful swallowing.  The feeling of having a lump in the throat.  Abitter taste in the mouth.  Bad breath.  Having a large amount of saliva.  Having an upset or bloated stomach.  Belching.  Chest pain. Different conditions can cause chest pain. Make sure you see your health care provider if you experience chest pain.  Shortness of breath or wheezing.  Ongoing (chronic) cough or a night-time cough.  Wearing away of tooth enamel.  Weight loss. How is this diagnosed? Your health care provider will take a medical history and perform a physical exam. To determine if you  have mild or severe GERD, your health care provider may also monitor how you respond to treatment. You may also have tests, including:  A test to examine your stomach and esophagus with a small camera (endoscopy).  A test thatmeasures the acidity level in your esophagus.  A test thatmeasures how much pressure is on your esophagus.  A barium swallow or modified  barium swallow test to show the shape, size, and functioning of your esophagus. How is this treated? The goal of treatment is to help relieve your symptoms and to prevent complications. Treatment for this condition may vary depending on how severe your symptoms are. Your health care provider may recommend:  Changes to your diet.  Medicine.  Surgery. Follow these instructions at home: Eating and drinking   Follow a diet as recommended by your health care provider. This may involve avoiding foods and drinks such as: ? Coffee and tea (with or without caffeine). ? Drinks that containalcohol. ? Energy drinks and sports drinks. ? Carbonated drinks or sodas. ? Chocolate and cocoa. ? Peppermint and mint flavorings. ? Garlic and onions. ? Horseradish. ? Spicy and acidic foods, including peppers, chili powder, curry powder, vinegar, hot sauces, and barbecue sauce. ? Citrus fruit juices and citrus fruits, such as oranges, lemons, and limes. ? Tomato-based foods, such as red sauce, chili, salsa, and pizza with red sauce. ? Fried and fatty foods, such as donuts, french fries, potato chips, and high-fat dressings. ? High-fat meats, such as hot dogs and fatty cuts of red and white meats, such as rib eye steak, sausage, ham, and bacon. ? High-fat dairy items, such as whole milk, butter, and cream cheese.  Eat small, frequent meals instead of large meals.  Avoid drinking large amounts of liquid with your meals.  Avoid eating meals during the 2-3 hours before bedtime.  Avoid lying down right after you eat.  Do not exercise right after you eat. Lifestyle   Do not use any products that contain nicotine or tobacco, such as cigarettes, e-cigarettes, and chewing tobacco. If you need help quitting, ask your health care provider.  Try to reduce your stress by using methods such as yoga or meditation. If you need help reducing stress, ask your health care provider.  If you are overweight, reduce  your weight to an amount that is healthy for you. Ask your health care provider for guidance about a safe weight loss goal. General instructions  Pay attention to any changes in your symptoms.  Take over-the-counter and prescription medicines only as told by your health care provider. Do not take aspirin, ibuprofen, or other NSAIDs unless your health care provider told you to do so.  Wear loose-fitting clothing. Do not wear anything tight around your waist that causes pressure on your abdomen.  Raise (elevate) the head of your bed about 6 inches (15 cm).  Avoid bending over if this makes your symptoms worse.  Keep all follow-up visits as told by your health care provider. This is important. Contact a health care provider if:  You have: ? New symptoms. ? Unexplained weight loss. ? Difficulty swallowing or it hurts to swallow. ? Wheezing or a persistent cough. ? A hoarse voice.  Your symptoms do not improve with treatment. Get help right away if you:  Have pain in your arms, neck, jaw, teeth, or back.  Feel sweaty, dizzy, or light-headed.  Have chest pain or shortness of breath.  Vomit and your vomit looks like blood or coffee grounds.  Faint.  Have stool that is bloody or black.  Cannot swallow, drink, or eat. Summary  Gastroesophageal reflux happens when acid from the stomach flows up into the esophagus. GERD is a disease in which the reflux happens often, causes frequent or severe symptoms, or causes problems such as damage to the esophagus.  Treatment for this condition may vary depending on how severe your symptoms are. Your health care provider may recommend diet and lifestyle changes, medicine, or surgery.  Contact a health care provider if you have new or worsening symptoms.  Take over-the-counter and prescription medicines only as told by your health care provider. Do not take aspirin, ibuprofen, or other NSAIDs unless your health care provider told you to do  so.  Keep all follow-up visits as told by your health care provider. This is important. This information is not intended to replace advice given to you by your health care provider. Make sure you discuss any questions you have with your health care provider. Document Revised: 11/22/2017 Document Reviewed: 11/22/2017 Elsevier Patient Education  2020 [employer].  I appreciate the  opportunity to care for you  Thank You   Marsa Aris , MD

## 2020-04-01 ENCOUNTER — Other Ambulatory Visit: Payer: BC Managed Care – PPO

## 2020-04-01 DIAGNOSIS — R1013 Epigastric pain: Secondary | ICD-10-CM | POA: Diagnosis not present

## 2020-04-01 DIAGNOSIS — K50919 Crohn's disease, unspecified, with unspecified complications: Secondary | ICD-10-CM | POA: Diagnosis not present

## 2020-04-01 DIAGNOSIS — K921 Melena: Secondary | ICD-10-CM | POA: Diagnosis not present

## 2020-04-01 DIAGNOSIS — R110 Nausea: Secondary | ICD-10-CM | POA: Diagnosis not present

## 2020-04-02 ENCOUNTER — Encounter: Payer: Self-pay | Admitting: *Deleted

## 2020-04-02 ENCOUNTER — Telehealth: Payer: Self-pay | Admitting: *Deleted

## 2020-04-02 DIAGNOSIS — K219 Gastro-esophageal reflux disease without esophagitis: Secondary | ICD-10-CM | POA: Insufficient documentation

## 2020-04-02 NOTE — Telephone Encounter (Signed)
Submitted prior auth through Cover My Meds Today waiting on response

## 2020-04-06 LAB — GI PROFILE, STOOL, PCR

## 2020-05-06 ENCOUNTER — Other Ambulatory Visit: Payer: Self-pay | Admitting: *Deleted

## 2020-05-06 ENCOUNTER — Encounter: Payer: BC Managed Care – PPO | Admitting: Gastroenterology

## 2020-05-30 HISTORY — PX: COLONOSCOPY: SHX174

## 2020-06-04 ENCOUNTER — Encounter: Payer: BC Managed Care – PPO | Admitting: Gastroenterology

## 2020-06-24 ENCOUNTER — Encounter: Payer: Self-pay | Admitting: Gastroenterology

## 2020-07-08 ENCOUNTER — Telehealth: Payer: Self-pay | Admitting: Gastroenterology

## 2020-07-08 NOTE — Telephone Encounter (Signed)
Patient advised. She will contact us when she needs her refills.

## 2020-07-08 NOTE — Telephone Encounter (Signed)
Please advise her to continue budesonide 9 mg daily until colonoscopy, based on findings we will plan to taper or discuss additional therapies as needed.  Please send her 3 refills for budesonide 9 mg daily.  Thank you

## 2020-07-08 NOTE — Telephone Encounter (Signed)
Pt is requesting a call back from a nurse to discuss if she needs to continue taking her ENTOCORT

## 2020-07-08 NOTE — Telephone Encounter (Signed)
Spoke with the patient. She states she is doing well. Normal once daily bowel movement. She had to reschedule the colonoscopy to 08/19/20. She has picked up the last refill for the Entocort. Her question is should she continue this, or do you want her to tamper off before the colonoscopy?

## 2020-08-05 ENCOUNTER — Other Ambulatory Visit: Payer: Self-pay

## 2020-08-05 ENCOUNTER — Ambulatory Visit (AMBULATORY_SURGERY_CENTER): Payer: BC Managed Care – PPO | Admitting: *Deleted

## 2020-08-05 VITALS — Ht 62.0 in | Wt 125.0 lb

## 2020-08-05 DIAGNOSIS — R1013 Epigastric pain: Secondary | ICD-10-CM

## 2020-08-05 DIAGNOSIS — R110 Nausea: Secondary | ICD-10-CM

## 2020-08-05 DIAGNOSIS — K921 Melena: Secondary | ICD-10-CM

## 2020-08-05 DIAGNOSIS — K50919 Crohn's disease, unspecified, with unspecified complications: Secondary | ICD-10-CM

## 2020-08-05 NOTE — Progress Notes (Addendum)
No egg or soy allergy known to patient  No issues with past sedation with any surgeries or procedures No intubation problems in the past  No FH of Malignant Hyperthermia No diet pills per patient No home 02 use per patient  No blood thinners per patient  Pt denies issues with constipation  No A fib or A flutter  EMMI video to pt or via MyChart  COVID 19 guidelines implemented in [place] today with Pt and RN  Pt is fully vaccinated  for Covid    Patient had Suprep from a previously cancelled colonoscopy   Due to the COVID-19 pandemic we are asking patients to follow certain guidelines.  Pt aware of COVID protocols and [facility] guidelines

## 2020-08-06 ENCOUNTER — Encounter: Payer: Self-pay | Admitting: Gastroenterology

## 2020-08-10 DIAGNOSIS — Z01419 Encounter for gynecological examination (general) (routine) without abnormal findings: Secondary | ICD-10-CM | POA: Diagnosis not present

## 2020-08-10 DIAGNOSIS — N926 Irregular menstruation, unspecified: Secondary | ICD-10-CM | POA: Diagnosis not present

## 2020-08-10 DIAGNOSIS — Z6823 Body mass index (BMI) 23.0-23.9, adult: Secondary | ICD-10-CM | POA: Diagnosis not present

## 2020-08-19 ENCOUNTER — Telehealth: Payer: Self-pay | Admitting: *Deleted

## 2020-08-19 ENCOUNTER — Other Ambulatory Visit: Payer: Self-pay

## 2020-08-19 ENCOUNTER — Other Ambulatory Visit: Payer: Self-pay | Admitting: Gastroenterology

## 2020-08-19 ENCOUNTER — Ambulatory Visit (AMBULATORY_SURGERY_CENTER): Payer: BC Managed Care – PPO | Admitting: Gastroenterology

## 2020-08-19 ENCOUNTER — Encounter: Payer: Self-pay | Admitting: Gastroenterology

## 2020-08-19 VITALS — BP 108/74 | HR 75 | Temp 97.3°F | Resp 13 | Ht 62.0 in | Wt 125.0 lb

## 2020-08-19 DIAGNOSIS — K2970 Gastritis, unspecified, without bleeding: Secondary | ICD-10-CM

## 2020-08-19 DIAGNOSIS — K50919 Crohn's disease, unspecified, with unspecified complications: Secondary | ICD-10-CM | POA: Diagnosis not present

## 2020-08-19 DIAGNOSIS — K2100 Gastro-esophageal reflux disease with esophagitis, without bleeding: Secondary | ICD-10-CM | POA: Diagnosis not present

## 2020-08-19 DIAGNOSIS — E538 Deficiency of other specified B group vitamins: Secondary | ICD-10-CM

## 2020-08-19 DIAGNOSIS — K2951 Unspecified chronic gastritis with bleeding: Secondary | ICD-10-CM | POA: Diagnosis not present

## 2020-08-19 DIAGNOSIS — K921 Melena: Secondary | ICD-10-CM

## 2020-08-19 DIAGNOSIS — K9189 Other postprocedural complications and disorders of digestive system: Secondary | ICD-10-CM | POA: Diagnosis not present

## 2020-08-19 DIAGNOSIS — Z1211 Encounter for screening for malignant neoplasm of colon: Secondary | ICD-10-CM | POA: Diagnosis not present

## 2020-08-19 DIAGNOSIS — R1013 Epigastric pain: Secondary | ICD-10-CM

## 2020-08-19 DIAGNOSIS — K529 Noninfective gastroenteritis and colitis, unspecified: Secondary | ICD-10-CM | POA: Diagnosis not present

## 2020-08-19 DIAGNOSIS — R110 Nausea: Secondary | ICD-10-CM

## 2020-08-19 DIAGNOSIS — K6389 Other specified diseases of intestine: Secondary | ICD-10-CM | POA: Diagnosis not present

## 2020-08-19 MED ORDER — CYANOCOBALAMIN 1000 MCG/ML IJ SOLN: 1000.0000 ug | INTRAMUSCULAR | 4 refills | Status: DC

## 2020-08-19 MED ORDER — OMEPRAZOLE 20 MG PO CPDR: DELAYED_RELEASE_CAPSULE | ORAL | 3 refills | Status: DC

## 2020-08-19 MED ORDER — SODIUM CHLORIDE 0.9 % IV SOLN: 500.0000 mL | Freq: Once | INTRAVENOUS | Status: DC

## 2020-08-19 MED FILL — CYANOCOBALAMIN 1,000 MCG/ML: 1000 | 28 days supply | Qty: 4 | Fill #0

## 2020-08-19 NOTE — Op Note (Signed)
[facility] Patient Name: Sophia Beard Procedure Date: 08/19/2020 10:52 AM MRN: [id number] Endoscopist: Napoleon Form , MD Age: 40 Referring MD:  Date of Birth: 1980/12/16 Gender: Female Account #: [id number] Procedure:                Upper GI endoscopy Indications:              Endoscopy to assess diarrhea in patient suspected                            of having Crohn's disease, Generalized abdominal                            pain, Crohn's disease Medicines:                Monitored Anesthesia Care Procedure:                Pre-Anesthesia Assessment:                           - Prior to the procedure, a History and Physical                            was performed, and patient medications and                            allergies were reviewed. The patient's tolerance of                            previous anesthesia was also reviewed. The risks                            and benefits of the procedure and the sedation                            options and risks were discussed with the patient.                            All questions were answered, and informed consent                            was obtained. Prior Anticoagulants: The patient has                            taken no previous anticoagulant or antiplatelet                            agents. ASA Grade Assessment: II - A patient with                            mild systemic disease. After reviewing the risks                            and benefits, the patient was deemed in  satisfactory condition to undergo the procedure.                           After obtaining informed consent, the endoscope was                            passed under direct vision. Throughout the                            procedure, the patient's blood pressure, pulse, and                            oxygen saturations were monitored continuously. The                            Endoscope was introduced  through the mouth, and                            advanced to the second part of duodenum. The upper                            GI endoscopy was accomplished without difficulty.                            The patient tolerated the procedure well. Findings:                 LA Grade B (one or more mucosal breaks greater than                            5 mm, not extending between the tops of two mucosal                            folds) esophagitis with no bleeding was found 34 to                            36 cm from the incisors.                           Patchy mild inflammation characterized by                            congestion (edema), erosions, erythema and mucus                            was found in the entire examined stomach. Biopsies                            were taken with a cold forceps for histology.                            Biopsies were taken with a cold forceps for  Helicobacter pylori testing.                           The examined duodenum was normal. Complications:            No immediate complications. Estimated Blood Loss:     Estimated blood loss was minimal. Impression:               - LA Grade B reflux esophagitis with no bleeding.                           - Gastritis. Biopsied.                           - Normal examined duodenum. Recommendation:           - Patient has a contact number available for                            emergencies. The signs and symptoms of potential                            delayed complications were discussed with the                            patient. Return to normal activities tomorrow.                            Written discharge instructions were provided to the                            patient.                           - Resume previous diet.                           - Continue present medications.                           - Await pathology results.                           - See the  other procedure note for documentation of                            additional recommendations.                           - Use Prilosec (omeprazole) 20 mg PO daily, 30                            minutes before breakfast X 90 tabs with 3 refills. Napoleon Form, MD 08/19/2020 11:34:30 AM This report has been signed electronically.

## 2020-08-19 NOTE — Progress Notes (Signed)
Pt's states no medical or surgical changes since previsit or office visit.  CW - vitals 

## 2020-08-19 NOTE — Progress Notes (Signed)
Called to room to assist during endoscopic procedure.  Patient ID and intended procedure confirmed with present staff. Received instructions for my participation in the procedure from the performing physician.  

## 2020-08-19 NOTE — Patient Instructions (Signed)
Discharge instructions given. Prescription sent to pharmacy. Handouts on esophagitis and Gastritis. Continue previous medications. Return to my office at the next available appointment. Please call to schedule appointment. YOU HAD AN ENDOSCOPIC PROCEDURE TODAY AT THE [facility]:   Refer to the procedure report that was given to you for any specific questions about what was found during the examination.  If the procedure report does not answer your questions, please call your gastroenterologist to clarify.  If you requested that your care partner not be given the details of your procedure findings, then the procedure report has been included in a sealed envelope for you to review at your convenience later.  YOU SHOULD EXPECT: Some feelings of bloating in the abdomen. Passage of more gas than usual.  Walking can help get rid of the air that was put into your GI tract during the procedure and reduce the bloating. If you had a lower endoscopy (such as a colonoscopy or flexible sigmoidoscopy) you may notice spotting of blood in your stool or on the toilet paper. If you underwent a bowel prep for your procedure, you may not have a normal bowel movement for a few days.  Please Note:  You might notice some irritation and congestion in your nose or some drainage.  This is from the oxygen used during your procedure.  There is no need for concern and it should clear up in a day or so.  SYMPTOMS TO REPORT IMMEDIATELY:   Following lower endoscopy (colonoscopy or flexible sigmoidoscopy):  Excessive amounts of blood in the stool  Significant tenderness or worsening of abdominal pains  Swelling of the abdomen that is new, acute  Fever of 100F or higher   Following upper endoscopy (EGD)  Vomiting of blood or coffee ground material  New chest pain or pain under the shoulder blades  Painful or persistently difficult swallowing  New shortness of breath  Fever of 100F or higher  Black,  tarry-looking stools  For urgent or emergent issues, a gastroenterologist can be reached at any hour by calling [phone]. Do not use MyChart messaging for urgent concerns.    DIET:  We do recommend a small meal at first, but then you may proceed to your regular diet.  Drink plenty of fluids but you should avoid alcoholic beverages for 24 hours.  ACTIVITY:  You should plan to take it easy for the rest of today and you should NOT DRIVE or use heavy machinery until tomorrow (because of the sedation medicines used during the test).    FOLLOW UP: Our staff will call the number listed on your records 48-72 hours following your procedure to check on you and address any questions or concerns that you may have regarding the information given to you following your procedure. If we do not reach you, we will leave a message.  We will attempt to reach you two times.  During this call, we will ask if you have developed any symptoms of COVID 19. If you develop any symptoms (ie: fever, flu-like symptoms, shortness of breath, cough etc.) before then, please call [phone].  If you test positive for Covid 19 in the 2 weeks post procedure, please call and report this information to [place].    If any biopsies were taken you will be contacted by phone or by letter within the next 1-3 weeks.  Please call us at [phone] if you have not heard about the biopsies in 3 weeks.    SIGNATURES/CONFIDENTIALITY: You  and/or your care partner have signed paperwork which will be entered into your electronic medical record.  These signatures attest to the fact that that the information above on your After Visit Summary has been reviewed and is understood.  Full responsibility of the confidentiality of this discharge information lies with you and/or your care-partner.

## 2020-08-19 NOTE — Op Note (Signed)
[facility] Patient Name: Sophia Beard Procedure Date: 08/19/2020 10:51 AM MRN: [id number] Endoscopist: Napoleon Form , MD Age: 40 Referring MD:  Date of Birth: 07/28/1980 Gender: Female Account #: [id number] Procedure:                Colonoscopy Indications:              Determine extent and severity of inflammatory bowel                            disease, Clinically significant diarrhea of                            unexplained origin Medicines:                Monitored Anesthesia Care Procedure:                Pre-Anesthesia Assessment:                           - Prior to the procedure, a History and Physical                            was performed, and patient medications and                            allergies were reviewed. The patient's tolerance of                            previous anesthesia was also reviewed. The risks                            and benefits of the procedure and the sedation                            options and risks were discussed with the patient.                            All questions were answered, and informed consent                            was obtained. Prior Anticoagulants: The patient has                            taken no previous anticoagulant or antiplatelet                            agents. ASA Grade Assessment: II - A patient with                            mild systemic disease. After reviewing the risks                            and benefits, the patient was deemed in  satisfactory condition to undergo the procedure.                           After obtaining informed consent, the colonoscope                            was passed under direct vision. Throughout the                            procedure, the patient's blood pressure, pulse, and                            oxygen saturations were monitored continuously. The                            Olympus [id number]-[id number] (#[id number])  Colonoscope was                            introduced through the anus and advanced to the the                            ileocolonic anastomosis. The colonoscopy was                            performed without difficulty. The patient tolerated                            the procedure well. The quality of the bowel                            preparation was adequate. The terminal ileum and                            the rectum were photographed. Scope In: 11:13:26 AM Scope Out: 11:28:32 AM Scope Withdrawal Time: 0 hours 9 minutes 21 seconds  Total Procedure Duration: 0 hours 15 minutes 6 seconds  Findings:                 The perianal and digital rectal examinations were                            normal.                           The terminal ileum appeared normal. Biopsies were                            taken with a cold forceps for histology.                           There was evidence of a prior end-to-side                            ileo-colonic anastomosis in the distal ascending  colon. This was patent and was characterized by                            healthy appearing mucosa. The anastomosis was                            traversed. Biopsies were taken with a cold forceps                            for histology.                           The mucosa vascular pattern in the transverse colon                            was diffusely decreased. Biopsies were taken with a                            cold forceps for histology.                           The rectum, sigmoid colon and descending colon                            appeared normal. Biopsies were taken with a cold                            forceps for histology. Complications:            No immediate complications. Estimated Blood Loss:     Estimated blood loss was minimal. Impression:               - The examined portion of the ileum was normal.                            Biopsied.                            - Patent end-to-side ileo-colonic anastomosis,                            characterized by healthy appearing mucosa. Biopsied.                           - Decreased mucosa vascular pattern in the                            transverse colon. Biopsied.                           - The rectum, sigmoid colon and descending colon                            are normal. Biopsied. Recommendation:           - Patient has a contact number available for  emergencies. The signs and symptoms of potential                            delayed complications were discussed with the                            patient. Return to normal activities tomorrow.                            Written discharge instructions were provided to the                            patient.                           - Resume previous diet.                           - Continue present medications.                           - Await pathology results.                           - Continue Budesonide 9mg  daily                           - Multivitamin with iron and vitamin D                           - Continue B12 injections                           - Repeat colonoscopy date to be determined after                            pending pathology results are reviewed for                            surveillance based on pathology results.                           - Return to my office at the next available                            appointment. Please call to schedule appointment , MD 08/19/2020 11:40:33 AM This report has been signed electronically.

## 2020-08-19 NOTE — Progress Notes (Signed)
Please schedule for weekly B12 injection X 4 followed by maintenance every 4 weeks Thanks

## 2020-08-19 NOTE — Progress Notes (Signed)
To PACU, VSS. Report to [email or web address] 

## 2020-08-19 NOTE — Telephone Encounter (Signed)
Please schedule for weekly B12 injection X 4 followed by maintenance every 4 weeks Thanks  Scheduled pt for 3/28 at 10 for 1st B12 injection

## 2020-08-20 NOTE — Telephone Encounter (Signed)
Called pt to inform her of her appointment for 1st B12 injection on Monday left her a message

## 2020-08-21 ENCOUNTER — Telehealth: Payer: Self-pay | Admitting: *Deleted

## 2020-08-21 ENCOUNTER — Telehealth: Payer: Self-pay

## 2020-08-21 NOTE — Telephone Encounter (Signed)
Follow up call made. 

## 2020-08-21 NOTE — Telephone Encounter (Signed)
Attempted to reach pt. With follow-up call following endoscopic procedure 08/19/2020.  LM on pt voice mail to call if she has any questions or concerns.

## 2020-08-24 ENCOUNTER — Ambulatory Visit (INDEPENDENT_AMBULATORY_CARE_PROVIDER_SITE_OTHER): Payer: BC Managed Care – PPO | Admitting: Gastroenterology

## 2020-08-24 DIAGNOSIS — E538 Deficiency of other specified B group vitamins: Secondary | ICD-10-CM | POA: Diagnosis not present

## 2020-08-24 MED ORDER — CYANOCOBALAMIN 1000 MCG/ML IJ SOLN
1000.0000 ug | INTRAMUSCULAR | Status: DC
Start: 2020-08-24 — End: 2020-10-23
  Administered 2020-08-24 – 2020-09-21 (×4): 1000 ug via INTRAMUSCULAR

## 2020-08-24 NOTE — Telephone Encounter (Signed)
Patient came in for her weekly B12 injection but did not have her medication with her. She said she didn't know she was suppose to pick it up because [facility] had not called her.  Dr Lavon Paganini had sent it to [facility] in error. Pt went to [facility] and picked up her rx for B 12 and came back over for [place] to give it to her. She wants [facility] taken out of her chart. She has next two weekly B12 appointments set up,  Orders in

## 2020-08-31 ENCOUNTER — Ambulatory Visit (INDEPENDENT_AMBULATORY_CARE_PROVIDER_SITE_OTHER): Payer: BC Managed Care – PPO | Admitting: Gastroenterology

## 2020-08-31 DIAGNOSIS — E538 Deficiency of other specified B group vitamins: Secondary | ICD-10-CM | POA: Diagnosis not present

## 2020-09-04 ENCOUNTER — Encounter: Payer: Self-pay | Admitting: Gastroenterology

## 2020-09-07 ENCOUNTER — Ambulatory Visit (INDEPENDENT_AMBULATORY_CARE_PROVIDER_SITE_OTHER): Payer: BC Managed Care – PPO | Admitting: Gastroenterology

## 2020-09-07 DIAGNOSIS — E538 Deficiency of other specified B group vitamins: Secondary | ICD-10-CM

## 2020-09-21 ENCOUNTER — Ambulatory Visit (INDEPENDENT_AMBULATORY_CARE_PROVIDER_SITE_OTHER): Payer: BC Managed Care – PPO | Admitting: Gastroenterology

## 2020-09-21 DIAGNOSIS — E538 Deficiency of other specified B group vitamins: Secondary | ICD-10-CM | POA: Diagnosis not present

## 2020-09-21 DIAGNOSIS — D519 Vitamin B12 deficiency anemia, unspecified: Secondary | ICD-10-CM

## 2020-09-22 ENCOUNTER — Other Ambulatory Visit (HOSPITAL_COMMUNITY): Payer: Self-pay

## 2020-09-22 MED FILL — Cyanocobalamin Inj 1000 MCG/ML: INTRAMUSCULAR | 28 days supply | Qty: 4 | Fill #0 | Status: AC

## 2020-09-24 DIAGNOSIS — N926 Irregular menstruation, unspecified: Secondary | ICD-10-CM | POA: Diagnosis not present

## 2020-09-28 ENCOUNTER — Telehealth: Payer: Self-pay | Admitting: Gastroenterology

## 2020-09-28 ENCOUNTER — Other Ambulatory Visit: Payer: Self-pay

## 2020-09-28 DIAGNOSIS — R1084 Generalized abdominal pain: Secondary | ICD-10-CM

## 2020-09-28 DIAGNOSIS — K50919 Crohn's disease, unspecified, with unspecified complications: Secondary | ICD-10-CM

## 2020-09-28 MED ORDER — DICYCLOMINE HCL 20 MG PO TABS: 20.0000 mg | ORAL_TABLET | Freq: Three times a day (TID) | ORAL | 0 refills | Status: DC | PRN

## 2020-09-28 NOTE — Telephone Encounter (Signed)
Inbound call from patient with complaints of intense constant pain in ribs (feel like squeezing), back, and breast bone. States it comes and goes last about 30 min to an hour. Pain has been happening about the last 4-5 weeks. Friday 4/29 the pain lasted 3hr and suddenly disappeared. Best contact number [phone]

## 2020-09-28 NOTE — Telephone Encounter (Signed)
Spoke with the patient. She will develop pain which always occurs in the evenings around 8 to 8:30, and less often in the middle of the night. It happens several times each week. It is a pain in her ribs wrapping into her back. It feels sharp and squeezing. She reports the pain will last from 30 minutes to a couple of hours. She has tried Pepcid, tagamet and pepto-bismol without any change in the pain. She has tried chewing gum with no relief. She has a little belching with the pain. She sits upright and is able to stand or walk though this does not help. She cannot lie down with this pain. She states this pain is becoming worse. She has not changed her diet. She eats her supper in the afternoon around 3 pm and a snack at 6 pm. Her snack is anything from strawberries to a small piece of cheesecake. She does not lay down after eating. She is on Omeprazole and denies missed doses. Please advise.

## 2020-09-28 NOTE — Telephone Encounter (Signed)
Please schedule CT abd & pelvis with contrast to further evaluate the pain, exclude any complications from IBD or prior surgery.  Please send Rx for Dicylomine 20mg  q8h prn. Advise her to eat small frequent meals with low residue diet. Follow up visit soon with me or APP. Thanks

## 2020-09-30 ENCOUNTER — Ambulatory Visit (HOSPITAL_COMMUNITY)
Admission: RE | Admit: 2020-09-30 | Discharge: 2020-09-30 | Disposition: A | Discharge status: Home / Self Care | Payer: BC Managed Care – PPO | Source: Ambulatory Visit | Attending: Gastroenterology | Admitting: Gastroenterology

## 2020-09-30 ENCOUNTER — Other Ambulatory Visit: Payer: Self-pay

## 2020-09-30 DIAGNOSIS — R1084 Generalized abdominal pain: Secondary | ICD-10-CM | POA: Insufficient documentation

## 2020-09-30 DIAGNOSIS — R109 Unspecified abdominal pain: Secondary | ICD-10-CM | POA: Diagnosis not present

## 2020-09-30 DIAGNOSIS — K50919 Crohn's disease, unspecified, with unspecified complications: Secondary | ICD-10-CM | POA: Diagnosis not present

## 2020-09-30 MED ORDER — IOHEXOL 300 MG/ML  SOLN
75.0000 mL | Freq: Once | INTRAMUSCULAR | Status: AC | PRN
  Administered 2020-09-30: 75 mL via INTRAVENOUS

## 2020-10-09 ENCOUNTER — Encounter (HOSPITAL_COMMUNITY): Payer: Self-pay | Admitting: Surgery

## 2020-10-09 ENCOUNTER — Other Ambulatory Visit: Payer: Self-pay

## 2020-10-09 ENCOUNTER — Ambulatory Visit: Payer: Self-pay | Admitting: Surgery

## 2020-10-09 DIAGNOSIS — K8050 Calculus of bile duct without cholangitis or cholecystitis without obstruction: Secondary | ICD-10-CM | POA: Diagnosis not present

## 2020-10-09 NOTE — H&P (View-Only) (Signed)
Surgical Evaluation  Chief Complaint: abdominal pain  HPI: Very pleasant 40 year old woman with a history of Crohn's disease status post laparoscopic ileocecectomy in 2015, protein S deficiency, Raynaud's disease, postop nausea and vomiting, ovarian cyst, asthma, anxiety, anal fissures referred by Dr. Lavon Paganini for possible symptomatic cholelithiasis. This was noted on a CT scan done 1 week ago for epigastric pain. She states that for several months, she has been having pain which is predominantly in the right subcostal and epigastric regions but which feels like her entire upper abdomen and mid back are being squeezed. This can last anywhere from 30 minutes to 3-1/2 hours. Not necessarily correlated with eating but typically happens at night a few hours after her last meal. She did have upper and lower endoscopy in March and did have some gastritis, but has had no relief in her symptoms with treatment of this with PPI/H2 blocker etc.    Allergies  Allergen Reactions  . Iron Dextran Anaphylaxis  . Sulfonamide Derivatives Hives    Past Medical History:  Diagnosis Date  . Anal fissure   . Anemia    not presently  . Anxiety   . Asthma    exersize induced  . Blood dyscrasia    on lovinox  . Crohn disease (HCC)   . Depression   . Family history of adverse reaction to anesthesia    mother / sister have nausea  . GI bleed   . Headache   . Ovarian cyst   . PONV (postoperative nausea and vomiting)   . Protein S deficiency (HCC) 11/2015  . Raynaud's disease   . Raynaud's disease     Past Surgical History:  Procedure Laterality Date  . COLONOSCOPY    . DILATION AND EVACUATION N/A 06/17/2016   Procedure: DILATATION AND EVACUATION;  Surgeon: Olivia Mackie, MD;  Location: [facility];  Service: Gynecology;  Laterality: N/A;  . LAPAROSCOPIC ILEOCECECTOMY N/A 04/21/2014   Procedure: LAPAROSCOPIC ASSISTED ILEOCYSTECTOMY;  Surgeon: Avel Peace, MD;  Location: [facility];  Service: General;   Laterality: N/A;  . WISDOM TOOTH EXTRACTION      Family History  Problem Relation Age of Onset  . Colon cancer Paternal Uncle   . Heart disease Paternal Grandfather   . Irritable bowel syndrome Sister   . Hypertension Mother   . Esophageal cancer Neg Hx   . Stomach cancer Neg Hx   . Rectal cancer Neg Hx     Social History   Socioeconomic History  . Marital status: Married    Spouse name: Not on file  . Number of children: 2  . Years of education: Not on file  . Highest education level: Not on file  Occupational History  . Occupation: Cytogeneticist: [employer]  Tobacco Use  . Smoking status: Never Smoker  . Smokeless tobacco: Never Used  Vaping Use  . Vaping Use: Never used  Substance and Sexual Activity  . Alcohol use: No  . Drug use: No  . 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 Has 2 daughters  Other Topics Concern  . Not on file  Social History Narrative  . Not on file   Social Determinants of Health   Financial Resource Strain: Not on file  Food Insecurity: Not on file  Transportation Needs: Not on file  Physical Activity: Not on file  Stress: Not on file  Social Connections: Not on file  Current Outpatient Medications on File Prior to Visit  Medication Sig Dispense Refill  . aspirin EC 81 MG tablet Take 81 mg by mouth daily. (Patient not taking: No sig reported)    . budesonide (ENTOCORT EC) 3 MG 24 hr capsule Take 3 capsules (9 mg total) by mouth daily. 90 capsule 3  . buPROPion (WELLBUTRIN XL) 150 MG 24 hr tablet Take 150 mg by mouth every morning.     . cyanocobalamin (,VITAMIN B-12,) 1000 MCG/ML injection INJECT 1 ML INTO THE MUSCLE EVERY 7 DAYS 4 mL 4  . dicyclomine (BENTYL) 20 MG tablet Take 1 tablet (20 mg total) by mouth every 8 (eight) hours as needed for spasms. 30 tablet 0  . diphenhydrAMINE HCl, Sleep, (ZZZQUIL PO) Take 25 mg by mouth at bedtime as needed.     . famotidine (PEPCID) 20 MG tablet Take 20 mg by mouth at bedtime.    Marland Kitchen omeprazole (PRILOSEC) 20 MG capsule Use Prilosec 20mg  by mouth daily, 30 minutes before breakfast times 90 tabs with 3 refills. 90 capsule 3  . ondansetron (ZOFRAN ODT) 4 MG disintegrating tablet Take 1 tablet (4 mg total) by mouth every 8 (eight) hours as needed for nausea or vomiting. Take per colonoscopy prep instructions, then take as needed 30 tablet 0  . pantoprazole (PROTONIX) 40 MG tablet Take 1 tablet (40 mg total) by mouth daily. 30 MINUTES BEFORE BREAKFAST (Patient not taking: No sig reported) 30 tablet 3   Current Facility-Administered Medications on File Prior to Visit  Medication Dose Route Frequency Provider Last Rate Last Admin  . cyanocobalamin ((VITAMIN B-12)) injection 1,000 mcg  1,000 mcg Intramuscular Weekly Nandigam, Kavitha V, MD   1,000 mcg at 09/21/20 1007    Review of Systems: a complete, 10pt review of systems was completed with pertinent positives and negatives as documented in the HPI  Physical Exam: Vitals  Weight: 122.13 lb Height: 62in Body Surface Area: 1.55 m Body Mass Index: 22.34 kg/m  Temp.: 98.88F  Pulse: 113 (Regular)  P.OX: 99% (Room air) BP: 138/90(Sitting, Left Arm, Standard)  Alert, well-appearing Unlabored respirations Abdomen soft and nontender. Incisions from previous ileocecectomy well-healed, extraction site in the right lower quadrant, no hernia or mass   CBC Latest Ref Rng & Units 03/31/2020 06/17/2016 04/15/2016  WBC 4.0 - 10.5 K/uL 9.9 7.9 9.5  Hemoglobin 12.0 - 15.0 g/dL 04/17/2016 15.6(H) 14.2  Hematocrit 36.0 - 46.0 % 38.5 43.4 42.2  Platelets 150.0 - 400.0 K/uL 398.0 302 326    CMP Latest Ref Rng & Units 03/31/2020 04/15/2016 07/03/2015  Glucose 70 - 99 mg/dL 76 87 84  BUN 6 - 23 mg/dL 7 6.9(L) 8  Creatinine 0.40 - 1.20 mg/dL 08/31/2015 0.8 7.20  Sodium 9.47 - 145 mEq/L 135 136 138  Potassium 3.5 - 5.1 mEq/L 3.8 3.5 4.2  Chloride 96 - 112 mEq/L 102 - 104   CO2 19 - 32 mEq/L 22 21(L) 27  Calcium 8.4 - 10.5 mg/dL 9.0 9.1 9.5  Total Protein 6.0 - 8.3 g/dL 7.7 - 7.7  Total Bilirubin 0.2 - 1.2 mg/dL 0.5 - 0.5  Alkaline Phos 39 - 117 U/L 76 - 69  AST 0 - 37 U/L 14 - 22  ALT 0 - 35 U/L 17 - 25    Lab Results  Component Value Date   INR 1.03 03/21/2014    Imaging: No results found.   A/P: BILIARY COLIC (K80.50) Story: I recommend proceeding with laparoscopic or robotic cholecystectomy. We went over  the relevant anatomy and pathology used a diagram to demonstrate an discussed the technique of the surgery. Discussed risks of surgery including bleeding, pain, scarring, intraabdominal injury specifically to the common bile duct and sequelae, bile leak, conversion to open surgery, failure to resolve symptoms, blood clots/ pulmonary embolus, heart attack, pneumonia, stroke, death. Questions welcomed and answered to patient's satisfaction. We will schedule as soon as possible.    Patient Active Problem List   Diagnosis Date Noted  . GERD (gastroesophageal reflux disease) 04/02/2020  . RUQ abdominal pain 05/04/2016  . Nausea without vomiting 05/04/2016  . 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 Pregnancy complicated by previous recurrent miscarriages 04/12/2016  . History of recurrent miscarriages, not currently pregnant 11/25/2015  . MTHFR mutation 11/25/2015  . Protein S deficiency (HCC) 11/24/2015  . Crohn's disease involving terminal ileum s/p lap ileocecectomy 04/21/14 04/21/2014  . Crohn's ileitis (HCC) 03/21/2014  . ANAL FISSURE 06/22/2009  . DIARRHEA 12/17/2007  . ANEMIA 09/03/2007  . DEPRESSION 09/03/2007  . ASTHMA 09/03/2007  . OVARIAN CYST 09/03/2007  . CROHN'S DISEASE, HX OF 09/03/2007       Phylliss Blakes, MD [facility]  See AMION to contact appropriate on-call provider

## 2020-10-09 NOTE — Progress Notes (Addendum)
COVID Vaccine Completed:  x3 Date COVID Vaccine completed:  08-16-19, 09-18-19 Has received booster:11-21 COVID vaccine manufacturer: Pfizer      Date of COVID positive in last 90 days: N/A  PCP - Martha Clan, MD Cardiologist - N/A  Chest x-ray - N/A EKG - N/A Stress Test - N/A ECHO - N/A Cardiac Cath - N/A Pacemaker/ICD device last checked: Spinal Cord Stimulator:  Sleep Study - N/A CPAP -   Fasting Blood Sugar - N/A Checks Blood Sugar _____ times a day  Blood Thinner Instructions: N/A Aspirin Instructions: Last Dose:  Activity level:  Can go up a flight of stairs and perform activities of daily living without stopping and without symptoms of chest pain or shortness of breath.  Able to exercise without symptoms  Anesthesia review: Hx of protein S deficiency.  Patient states that she had to do Lovenox injections while pregnant.    Patient denies shortness of breath, fever, cough and chest pain at PAT appointment (completed over the phone)   Patient verbalized understanding of instructions that were given to them at the PAT appointment. Patient was also instructed that they will need to review over the PAT instructions again at home before surgery.

## 2020-10-09 NOTE — H&P (Signed)
Surgical Evaluation  Chief Complaint: abdominal pain  HPI: Very pleasant 40 year old woman with a history of Crohn's disease status post laparoscopic ileocecectomy in 2015, protein S deficiency, Raynaud's disease, postop nausea and vomiting, ovarian cyst, asthma, anxiety, anal fissures referred by Dr. Lavon Paganini for possible symptomatic cholelithiasis. This was noted on a CT scan done 1 week ago for epigastric pain. She states that for several months, she has been having pain which is predominantly in the right subcostal and epigastric regions but which feels like her entire upper abdomen and mid back are being squeezed. This can last anywhere from 30 minutes to 3-1/2 hours. Not necessarily correlated with eating but typically happens at night a few hours after her last meal. She did have upper and lower endoscopy in March and did have some gastritis, but has had no relief in her symptoms with treatment of this with PPI/H2 blocker etc.    Allergies  Allergen Reactions  . Iron Dextran Anaphylaxis  . Sulfonamide Derivatives Hives    Past Medical History:  Diagnosis Date  . Anal fissure   . Anemia    not presently  . Anxiety   . Asthma    exersize induced  . Blood dyscrasia    on lovinox  . Crohn disease (HCC)   . Depression   . Family history of adverse reaction to anesthesia    mother / sister have nausea  . GI bleed   . Headache   . Ovarian cyst   . PONV (postoperative nausea and vomiting)   . Protein S deficiency (HCC) 11/2015  . Raynaud's disease   . Raynaud's disease     Past Surgical History:  Procedure Laterality Date  . COLONOSCOPY    . DILATION AND EVACUATION N/A 06/17/2016   Procedure: DILATATION AND EVACUATION;  Surgeon: Olivia Mackie, MD;  Location: [facility];  Service: Gynecology;  Laterality: N/A;  . LAPAROSCOPIC ILEOCECECTOMY N/A 04/21/2014   Procedure: LAPAROSCOPIC ASSISTED ILEOCYSTECTOMY;  Surgeon: Avel Peace, MD;  Location: [facility];  Service: General;   Laterality: N/A;  . WISDOM TOOTH EXTRACTION      Family History  Problem Relation Age of Onset  . Colon cancer Paternal Uncle   . Heart disease Paternal Grandfather   . Irritable bowel syndrome Sister   . Hypertension Mother   . Esophageal cancer Neg Hx   . Stomach cancer Neg Hx   . Rectal cancer Neg Hx     Social History   Socioeconomic History  . Marital status: Married    Spouse name: Not on file  . Number of children: 2  . Years of education: Not on file  . Highest education level: Not on file  Occupational History  . Occupation: Cytogeneticist: [employer]  Tobacco Use  . Smoking status: Never Smoker  . Smokeless tobacco: Never Used  Vaping Use  . Vaping Use: Never used  Substance and Sexual Activity  . Alcohol use: No  . Drug use: No  . 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 Has 2 daughters  Other Topics Concern  . Not on file  Social History Narrative  . Not on file   Social Determinants of Health   Financial Resource Strain: Not on file  Food Insecurity: Not on file  Transportation Needs: Not on file  Physical Activity: Not on file  Stress: Not on file  Social Connections: Not on file  Current Outpatient Medications on File Prior to Visit  Medication Sig Dispense Refill  . aspirin EC 81 MG tablet Take 81 mg by mouth daily. (Patient not taking: No sig reported)    . budesonide (ENTOCORT EC) 3 MG 24 hr capsule Take 3 capsules (9 mg total) by mouth daily. 90 capsule 3  . buPROPion (WELLBUTRIN XL) 150 MG 24 hr tablet Take 150 mg by mouth every morning.     . cyanocobalamin (,VITAMIN B-12,) 1000 MCG/ML injection INJECT 1 ML INTO THE MUSCLE EVERY 7 DAYS 4 mL 4  . dicyclomine (BENTYL) 20 MG tablet Take 1 tablet (20 mg total) by mouth every 8 (eight) hours as needed for spasms. 30 tablet 0  . diphenhydrAMINE HCl, Sleep, (ZZZQUIL PO) Take 25 mg by mouth at bedtime as needed.     . famotidine (PEPCID) 20 MG tablet Take 20 mg by mouth at bedtime.    Marland Kitchen omeprazole (PRILOSEC) 20 MG capsule Use Prilosec 20mg  by mouth daily, 30 minutes before breakfast times 90 tabs with 3 refills. 90 capsule 3  . ondansetron (ZOFRAN ODT) 4 MG disintegrating tablet Take 1 tablet (4 mg total) by mouth every 8 (eight) hours as needed for nausea or vomiting. Take per colonoscopy prep instructions, then take as needed 30 tablet 0  . pantoprazole (PROTONIX) 40 MG tablet Take 1 tablet (40 mg total) by mouth daily. 30 MINUTES BEFORE BREAKFAST (Patient not taking: No sig reported) 30 tablet 3   Current Facility-Administered Medications on File Prior to Visit  Medication Dose Route Frequency Provider Last Rate Last Admin  . cyanocobalamin ((VITAMIN B-12)) injection 1,000 mcg  1,000 mcg Intramuscular Weekly Nandigam, Kavitha V, MD   1,000 mcg at 09/21/20 1007    Review of Systems: a complete, 10pt review of systems was completed with pertinent positives and negatives as documented in the HPI  Physical Exam: Vitals  Weight: 122.13 lb Height: 62in Body Surface Area: 1.55 m Body Mass Index: 22.34 kg/m  Temp.: 98.88F  Pulse: 113 (Regular)  P.OX: 99% (Room air) BP: 138/90(Sitting, Left Arm, Standard)  Alert, well-appearing Unlabored respirations Abdomen soft and nontender. Incisions from previous ileocecectomy well-healed, extraction site in the right lower quadrant, no hernia or mass   CBC Latest Ref Rng & Units 03/31/2020 06/17/2016 04/15/2016  WBC 4.0 - 10.5 K/uL 9.9 7.9 9.5  Hemoglobin 12.0 - 15.0 g/dL 04/17/2016 15.6(H) 14.2  Hematocrit 36.0 - 46.0 % 38.5 43.4 42.2  Platelets 150.0 - 400.0 K/uL 398.0 302 326    CMP Latest Ref Rng & Units 03/31/2020 04/15/2016 07/03/2015  Glucose 70 - 99 mg/dL 76 87 84  BUN 6 - 23 mg/dL 7 6.9(L) 8  Creatinine 0.40 - 1.20 mg/dL 08/31/2015 0.8 7.20  Sodium 9.47 - 145 mEq/L 135 136 138  Potassium 3.5 - 5.1 mEq/L 3.8 3.5 4.2  Chloride 96 - 112 mEq/L 102 - 104   CO2 19 - 32 mEq/L 22 21(L) 27  Calcium 8.4 - 10.5 mg/dL 9.0 9.1 9.5  Total Protein 6.0 - 8.3 g/dL 7.7 - 7.7  Total Bilirubin 0.2 - 1.2 mg/dL 0.5 - 0.5  Alkaline Phos 39 - 117 U/L 76 - 69  AST 0 - 37 U/L 14 - 22  ALT 0 - 35 U/L 17 - 25    Lab Results  Component Value Date   INR 1.03 03/21/2014    Imaging: No results found.   A/P: BILIARY COLIC (K80.50) Story: I recommend proceeding with laparoscopic or robotic cholecystectomy. We went over  the relevant anatomy and pathology used a diagram to demonstrate an discussed the technique of the surgery. Discussed risks of surgery including bleeding, pain, scarring, intraabdominal injury specifically to the common bile duct and sequelae, bile leak, conversion to open surgery, failure to resolve symptoms, blood clots/ pulmonary embolus, heart attack, pneumonia, stroke, death. Questions welcomed and answered to patient's satisfaction. We will schedule as soon as possible.    Patient Active Problem List   Diagnosis Date Noted  . GERD (gastroesophageal reflux disease) 04/02/2020  . RUQ abdominal pain 05/04/2016  . Nausea without vomiting 05/04/2016  . 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 Pregnancy complicated by previous recurrent miscarriages 04/12/2016  . History of recurrent miscarriages, not currently pregnant 11/25/2015  . MTHFR mutation 11/25/2015  . Protein S deficiency (HCC) 11/24/2015  . Crohn's disease involving terminal ileum s/p lap ileocecectomy 04/21/14 04/21/2014  . Crohn's ileitis (HCC) 03/21/2014  . ANAL FISSURE 06/22/2009  . DIARRHEA 12/17/2007  . ANEMIA 09/03/2007  . DEPRESSION 09/03/2007  . ASTHMA 09/03/2007  . OVARIAN CYST 09/03/2007  . CROHN'S DISEASE, HX OF 09/03/2007       Phylliss Blakes, MD [facility]  See AMION to contact appropriate on-call provider

## 2020-10-12 ENCOUNTER — Other Ambulatory Visit (HOSPITAL_COMMUNITY)
Admission: RE | Admit: 2020-10-12 | Discharge: 2020-10-12 | Disposition: A | Discharge status: Home / Self Care | Payer: BC Managed Care – PPO | Source: Ambulatory Visit | Attending: Surgery | Admitting: Surgery

## 2020-10-12 ENCOUNTER — Encounter (HOSPITAL_COMMUNITY): Payer: Self-pay | Admitting: Surgery

## 2020-10-12 DIAGNOSIS — Z87892 Personal history of anaphylaxis: Secondary | ICD-10-CM | POA: Diagnosis not present

## 2020-10-12 DIAGNOSIS — K851 Biliary acute pancreatitis without necrosis or infection: Secondary | ICD-10-CM | POA: Diagnosis not present

## 2020-10-12 DIAGNOSIS — D6859 Other primary thrombophilia: Secondary | ICD-10-CM | POA: Diagnosis not present

## 2020-10-12 DIAGNOSIS — Z91048 Other nonmedicinal substance allergy status: Secondary | ICD-10-CM | POA: Diagnosis not present

## 2020-10-12 DIAGNOSIS — Z7952 Long term (current) use of systemic steroids: Secondary | ICD-10-CM | POA: Diagnosis not present

## 2020-10-12 DIAGNOSIS — Z7982 Long term (current) use of aspirin: Secondary | ICD-10-CM | POA: Diagnosis not present

## 2020-10-12 DIAGNOSIS — Z8719 Personal history of other diseases of the digestive system: Secondary | ICD-10-CM | POA: Diagnosis not present

## 2020-10-12 DIAGNOSIS — K8012 Calculus of gallbladder with acute and chronic cholecystitis without obstruction: Secondary | ICD-10-CM | POA: Diagnosis not present

## 2020-10-12 DIAGNOSIS — I7300 Raynaud's syndrome without gangrene: Secondary | ICD-10-CM | POA: Diagnosis not present

## 2020-10-12 DIAGNOSIS — Z20822 Contact with and (suspected) exposure to covid-19: Secondary | ICD-10-CM | POA: Insufficient documentation

## 2020-10-12 DIAGNOSIS — Z01812 Encounter for preprocedural laboratory examination: Secondary | ICD-10-CM | POA: Insufficient documentation

## 2020-10-12 DIAGNOSIS — Z79899 Other long term (current) drug therapy: Secondary | ICD-10-CM | POA: Diagnosis not present

## 2020-10-12 DIAGNOSIS — J45909 Unspecified asthma, uncomplicated: Secondary | ICD-10-CM | POA: Diagnosis not present

## 2020-10-12 DIAGNOSIS — K8010 Calculus of gallbladder with chronic cholecystitis without obstruction: Secondary | ICD-10-CM | POA: Diagnosis not present

## 2020-10-12 DIAGNOSIS — Z882 Allergy status to sulfonamides status: Secondary | ICD-10-CM | POA: Diagnosis not present

## 2020-10-12 DIAGNOSIS — K5090 Crohn's disease, unspecified, without complications: Secondary | ICD-10-CM | POA: Diagnosis not present

## 2020-10-12 LAB — SARS CORONAVIRUS 2 (TAT 6-24 HRS): SARS Coronavirus 2: NEGATIVE

## 2020-10-12 MED ORDER — BUPIVACAINE LIPOSOME 1.3 % IJ SUSP
20.0000 mL | INTRAMUSCULAR | Status: DC
  Filled 2020-10-12: qty 20

## 2020-10-12 NOTE — Progress Notes (Signed)
Anesthesia Chart Review:  Pt is a same day work up   Case: [id number] Date/Time: 10/13/20 1445   Procedure: LAPAROSCOPIC CHOLECYSTECTOMY (N/A )   Anesthesia type: General   Pre-op diagnosis: BILIARY COLIC   Location: [facility] RO[facility] 01 / [facility]   Surgeons: Berna Bue, MD      DISCUSSION: Pt is 40 years old with hx Crohn disease, asthma, protein S deficiency, Raynaud's disease.     PROVIDERS: - PCP is Cleatis Polka., MD - Saw hematologist Artis Delay, MD last on 07/19/16 for borderline low protein S level. Pt has experienced multiple miscarriages suspected to be related at least in part to protein S deficiency. Pt has not experienced blood clots so long-term prophylactic anticoagulation was not recommended.  Pt did use lovenox during one pregnancy that resulted in miscarriage.    LABS: Will be obtained day of surgery    IMAGES: CT abd/pelvis 10/02/20 - No acute findings. No evidence of active Crohn's disease or complication. - Cholelithiasis. No radiographic evidence of cholecystitis.   EKG: N/A   CV: N/A  Past Medical History:  Diagnosis Date  . Anal fissure   . Anemia    not presently  . Anxiety   . Asthma    exercise induced  . Crohn disease (HCC)   . Depression   . Family history of adverse reaction to anesthesia    mother / sister have nausea  . GERD (gastroesophageal reflux disease)   . GI bleed   . Headache   . Ovarian cyst   . PONV (postoperative nausea and vomiting)   . Protein S deficiency (HCC) 11/2015  . Raynaud's disease     Past Surgical History:  Procedure Laterality Date  . COLONOSCOPY    . DILATION AND EVACUATION N/A 06/17/2016   Procedure: DILATATION AND EVACUATION;  Surgeon: Olivia Mackie, MD;  Location: [facility];  Service: Gynecology;  Laterality: N/A;  . ESOPHAGOGASTRODUODENOSCOPY ENDOSCOPY    . LAPAROSCOPIC ILEOCECECTOMY N/A 04/21/2014   Procedure: LAPAROSCOPIC ASSISTED ILEOCYSTECTOMY;  Surgeon: Avel Peace, MD;  Location: [facility];   Service: General;  Laterality: N/A;  . WISDOM TOOTH EXTRACTION      MEDICATIONS: . [START ON 10/13/2020] bupivacaine liposome (EXPAREL) 1.3 % injection 266 mg  . cyanocobalamin ((VITAMIN B-12)) injection 1,000 mcg   . budesonide (ENTOCORT EC) 3 MG 24 hr capsule  . buPROPion (WELLBUTRIN XL) 300 MG 24 hr tablet  . cyanocobalamin (,VITAMIN B-12,) 1000 MCG/ML injection  . dicyclomine (BENTYL) 20 MG tablet  . omeprazole (PRILOSEC) 20 MG capsule  . ondansetron (ZOFRAN ODT) 4 MG disintegrating tablet  . diphenhydrAMINE HCl, Sleep, (ZZZQUIL PO)    If labs acceptable day of surgery, I anticipate pt can proceed with surgery as scheduled.  Rica Mast, PhD, FNP-BC [facility]/Anesthesiology Phone: [phone] 10/12/2020 8:58 AM

## 2020-10-12 NOTE — Anesthesia Preprocedure Evaluation (Addendum)
Anesthesia Evaluation  Patient identified by MRN, date of birth, ID band Patient awake    Reviewed: Allergy & Precautions, NPO status , Patient's Chart, lab work & pertinent test results  History of Anesthesia Complications (+) PONV  Airway Mallampati: II  TM Distance: >3 FB Neck ROM: Full    Dental no notable dental hx. (+) Teeth Intact, Dental Advisory Given   Pulmonary asthma ,    Pulmonary exam normal breath sounds clear to auscultation       Cardiovascular Exercise Tolerance: Good Normal cardiovascular exam Rhythm:Regular Rate:Normal     Neuro/Psych Anxiety Depression negative neurological ROS     GI/Hepatic Neg liver ROS, GERD  Medicated and Controlled,Hx of crohns dz   Endo/Other  negative endocrine ROS  Renal/GU      Musculoskeletal   Abdominal   Peds  Hematology  (+) anemia , Protein S deficiency Lab Results      Component                Value               Date                      WBC                      13.4 (H)            10/13/2020                HGB                      10.6 (L)            10/13/2020                HCT                      34.8 (L)            10/13/2020                MCV                      74.5 (L)            10/13/2020                PLT                      418 (H)             10/13/2020              Anesthesia Other Findings All: Fe, Sulfa  Reproductive/Obstetrics negative OB ROS                            Anesthesia Physical Anesthesia Plan  ASA: II  Anesthesia Plan: General   Post-op Pain Management:    Induction: Intravenous  PONV Risk Score and Plan: 4 or greater and Treatment may vary due to age or medical condition, Ondansetron, Dexamethasone, Midazolam and Scopolamine patch - Pre-op  Airway Management Planned: Oral ETT  Additional Equipment: None  Intra-op Plan:   Post-operative Plan: Extubation in OR  Informed Consent: I  have reviewed the patients History and Physical, chart, labs and discussed the procedure including the risks, benefits and alternatives for the proposed anesthesia  with the patient or authorized representative who has indicated his/her understanding and acceptance.     Dental advisory given  Plan Discussed with: CRNA  Anesthesia Plan Comments: (See APP note by Joslyn Hy, FNP )       Anesthesia Quick Evaluation

## 2020-10-13 ENCOUNTER — Ambulatory Visit (HOSPITAL_COMMUNITY): Payer: BC Managed Care – PPO | Admitting: Emergency Medicine

## 2020-10-13 ENCOUNTER — Emergency Department (HOSPITAL_COMMUNITY)
Admission: EM | Admit: 2020-10-13 | Discharge: 2020-10-13 | Disposition: A | Discharge status: Home / Self Care | Payer: BC Managed Care – PPO | Source: Home / Self Care

## 2020-10-13 ENCOUNTER — Ambulatory Visit (HOSPITAL_COMMUNITY)
Admission: RE | Admit: 2020-10-13 | Discharge: 2020-10-13 | Disposition: A | Discharge status: Home / Self Care | Payer: BC Managed Care – PPO | Attending: Surgery | Admitting: Surgery

## 2020-10-13 ENCOUNTER — Ambulatory Visit (HOSPITAL_COMMUNITY): Payer: BC Managed Care – PPO

## 2020-10-13 ENCOUNTER — Encounter (HOSPITAL_COMMUNITY): Payer: Self-pay | Admitting: Surgery

## 2020-10-13 ENCOUNTER — Encounter (HOSPITAL_COMMUNITY)
Admission: RE | Disposition: A | Discharge status: Home / Self Care | Payer: Self-pay | Source: Home / Self Care | Attending: Surgery

## 2020-10-13 ENCOUNTER — Encounter (HOSPITAL_COMMUNITY): Payer: Self-pay

## 2020-10-13 DIAGNOSIS — J45909 Unspecified asthma, uncomplicated: Secondary | ICD-10-CM | POA: Insufficient documentation

## 2020-10-13 DIAGNOSIS — Z8719 Personal history of other diseases of the digestive system: Secondary | ICD-10-CM | POA: Insufficient documentation

## 2020-10-13 DIAGNOSIS — D6859 Other primary thrombophilia: Secondary | ICD-10-CM | POA: Insufficient documentation

## 2020-10-13 DIAGNOSIS — Z5321 Procedure and treatment not carried out due to patient leaving prior to being seen by health care provider: Secondary | ICD-10-CM | POA: Insufficient documentation

## 2020-10-13 DIAGNOSIS — K5090 Crohn's disease, unspecified, without complications: Secondary | ICD-10-CM | POA: Insufficient documentation

## 2020-10-13 DIAGNOSIS — K851 Biliary acute pancreatitis without necrosis or infection: Secondary | ICD-10-CM | POA: Insufficient documentation

## 2020-10-13 DIAGNOSIS — I7300 Raynaud's syndrome without gangrene: Secondary | ICD-10-CM | POA: Insufficient documentation

## 2020-10-13 DIAGNOSIS — Z91048 Other nonmedicinal substance allergy status: Secondary | ICD-10-CM | POA: Insufficient documentation

## 2020-10-13 DIAGNOSIS — K8010 Calculus of gallbladder with chronic cholecystitis without obstruction: Secondary | ICD-10-CM | POA: Insufficient documentation

## 2020-10-13 DIAGNOSIS — Z882 Allergy status to sulfonamides status: Secondary | ICD-10-CM | POA: Insufficient documentation

## 2020-10-13 DIAGNOSIS — Z79899 Other long term (current) drug therapy: Secondary | ICD-10-CM | POA: Insufficient documentation

## 2020-10-13 DIAGNOSIS — K219 Gastro-esophageal reflux disease without esophagitis: Secondary | ICD-10-CM | POA: Diagnosis not present

## 2020-10-13 DIAGNOSIS — Z7982 Long term (current) use of aspirin: Secondary | ICD-10-CM | POA: Insufficient documentation

## 2020-10-13 DIAGNOSIS — K602 Anal fissure, unspecified: Secondary | ICD-10-CM | POA: Diagnosis not present

## 2020-10-13 DIAGNOSIS — Z20822 Contact with and (suspected) exposure to covid-19: Secondary | ICD-10-CM | POA: Insufficient documentation

## 2020-10-13 DIAGNOSIS — K8020 Calculus of gallbladder without cholecystitis without obstruction: Secondary | ICD-10-CM | POA: Diagnosis not present

## 2020-10-13 DIAGNOSIS — Z87892 Personal history of anaphylaxis: Secondary | ICD-10-CM | POA: Insufficient documentation

## 2020-10-13 DIAGNOSIS — Z48815 Encounter for surgical aftercare following surgery on the digestive system: Secondary | ICD-10-CM | POA: Diagnosis not present

## 2020-10-13 DIAGNOSIS — K8050 Calculus of bile duct without cholangitis or cholecystitis without obstruction: Secondary | ICD-10-CM

## 2020-10-13 DIAGNOSIS — Z7952 Long term (current) use of systemic steroids: Secondary | ICD-10-CM | POA: Insufficient documentation

## 2020-10-13 DIAGNOSIS — R1011 Right upper quadrant pain: Secondary | ICD-10-CM | POA: Insufficient documentation

## 2020-10-13 DIAGNOSIS — D649 Anemia, unspecified: Secondary | ICD-10-CM | POA: Diagnosis not present

## 2020-10-13 DIAGNOSIS — K3580 Unspecified acute appendicitis: Secondary | ICD-10-CM | POA: Diagnosis not present

## 2020-10-13 HISTORY — DX: Gastro-esophageal reflux disease without esophagitis: K21.9

## 2020-10-13 HISTORY — PX: CHOLECYSTECTOMY: SHX55

## 2020-10-13 LAB — CBC
HCT: 34.8 % — ABNORMAL LOW (ref 36.0–46.0)
Hemoglobin: 10.6 g/dL — ABNORMAL LOW (ref 12.0–15.0)
MCH: 22.7 pg — ABNORMAL LOW (ref 26.0–34.0)
MCHC: 30.5 g/dL (ref 30.0–36.0)
MCV: 74.5 fL — ABNORMAL LOW (ref 80.0–100.0)
Platelets: 418 10*3/uL — ABNORMAL HIGH (ref 150–400)
RBC: 4.67 MIL/uL (ref 3.87–5.11)
RDW: 15.5 % (ref 11.5–15.5)
WBC: 13.4 10*3/uL — ABNORMAL HIGH (ref 4.0–10.5)
nRBC: 0 % (ref 0.0–0.2)

## 2020-10-13 LAB — COMPREHENSIVE METABOLIC PANEL
ALT: 61 U/L — ABNORMAL HIGH (ref 0–44)
AST: 191 U/L — ABNORMAL HIGH (ref 15–41)
Albumin: 3.8 g/dL (ref 3.5–5.0)
Alkaline Phosphatase: 64 U/L (ref 38–126)
Anion gap: 10 (ref 5–15)
BUN: 7 mg/dL (ref 6–20)
CO2: 21 mmol/L — ABNORMAL LOW (ref 22–32)
Calcium: 8.9 mg/dL (ref 8.9–10.3)
Chloride: 107 mmol/L (ref 98–111)
Creatinine, Ser: 0.71 mg/dL (ref 0.44–1.00)
GFR, Estimated: >60 mL/min (ref >60)
Glucose, Bld: 92 mg/dL (ref 70–99)
Potassium: 3 mmol/L — ABNORMAL LOW (ref 3.5–5.1)
Sodium: 138 mmol/L (ref 135–145)
Total Bilirubin: 0.5 mg/dL (ref 0.3–1.2)
Total Protein: 7 g/dL (ref 6.5–8.1)

## 2020-10-13 LAB — LIPASE, BLOOD: Lipase: 3395 U/L — ABNORMAL HIGH (ref 11–51)

## 2020-10-13 LAB — PREGNANCY, URINE: Preg Test, Ur: NEGATIVE

## 2020-10-13 LAB — I-STAT BETA HCG BLOOD, ED (MC, WL, AP ONLY): I-stat hCG, quantitative: <5 m[IU]/mL (ref <5)

## 2020-10-13 SURGERY — LAPAROSCOPIC CHOLECYSTECTOMY
Anesthesia: General | Site: Abdomen

## 2020-10-13 MED ORDER — DEXMEDETOMIDINE (PRECEDEX) IN NS 20 MCG/5ML (4 MCG/ML) IV SYRINGE
PREFILLED_SYRINGE | INTRAVENOUS | Status: DC | PRN
  Administered 2020-10-13: 8 ug via INTRAVENOUS

## 2020-10-13 MED ORDER — LACTATED RINGERS IV SOLN: INTRAVENOUS | Status: DC

## 2020-10-13 MED ORDER — LIDOCAINE 2% (20 MG/ML) 5 ML SYRINGE
INTRAMUSCULAR | Status: AC
  Filled 2020-10-13: qty 5

## 2020-10-13 MED ORDER — OXYCODONE HCL 5 MG PO TABS
ORAL_TABLET | ORAL | Status: AC
  Filled 2020-10-13: qty 1

## 2020-10-13 MED ORDER — HYDROMORPHONE HCL 1 MG/ML IJ SOLN: 0.2500 mg | INTRAMUSCULAR | Status: DC | PRN

## 2020-10-13 MED ORDER — OXYCODONE HCL 5 MG PO TABS
5.0000 mg | ORAL_TABLET | Freq: Once | ORAL | Status: AC | PRN
  Administered 2020-10-13: 5 mg via ORAL

## 2020-10-13 MED ORDER — SCOPOLAMINE 1 MG/3DAYS TD PT72
MEDICATED_PATCH | TRANSDERMAL | Status: AC
  Administered 2020-10-13: 1.5 mg
  Filled 2020-10-13: qty 1

## 2020-10-13 MED ORDER — CEFAZOLIN SODIUM-DEXTROSE 2-4 GM/100ML-% IV SOLN
2.0000 g | INTRAVENOUS | Status: AC
  Administered 2020-10-13: 2 g via INTRAVENOUS

## 2020-10-13 MED ORDER — FENTANYL CITRATE (PF) 100 MCG/2ML IJ SOLN
INTRAMUSCULAR | Status: AC
  Filled 2020-10-13: qty 2

## 2020-10-13 MED ORDER — ACETAMINOPHEN 10 MG/ML IV SOLN: 1000.0000 mg | Freq: Once | INTRAVENOUS | Status: DC | PRN

## 2020-10-13 MED ORDER — SODIUM CHLORIDE 0.9 % IV SOLN: 250.0000 mL | INTRAVENOUS | Status: DC | PRN

## 2020-10-13 MED ORDER — OXYCODONE HCL 5 MG PO TABS: 5.0000 mg | ORAL_TABLET | ORAL | Status: DC | PRN

## 2020-10-13 MED ORDER — ONDANSETRON HCL 4 MG/2ML IJ SOLN
INTRAMUSCULAR | Status: AC
  Filled 2020-10-13: qty 2

## 2020-10-13 MED ORDER — DEXMEDETOMIDINE (PRECEDEX) IN NS 20 MCG/5ML (4 MCG/ML) IV SYRINGE
PREFILLED_SYRINGE | INTRAVENOUS | Status: AC
  Filled 2020-10-13: qty 5

## 2020-10-13 MED ORDER — ACETAMINOPHEN 500 MG PO TABS
ORAL_TABLET | ORAL | Status: AC
  Administered 2020-10-13: 500 mg
  Filled 2020-10-13: qty 2

## 2020-10-13 MED ORDER — FENTANYL CITRATE (PF) 100 MCG/2ML IJ SOLN: 25.0000 ug | INTRAMUSCULAR | Status: DC | PRN

## 2020-10-13 MED ORDER — INDOCYANINE GREEN 25 MG IV SOLR
7.5000 mg | Freq: Once | INTRAVENOUS | Status: AC
  Administered 2020-10-13: 7.5 mg via INTRAVENOUS
  Filled 2020-10-13: qty 3

## 2020-10-13 MED ORDER — SODIUM CHLORIDE 0.9% FLUSH: 3.0000 mL | Freq: Two times a day (BID) | INTRAVENOUS | Status: DC

## 2020-10-13 MED ORDER — OXYCODONE HCL 5 MG/5ML PO SOLN: 5.0000 mg | Freq: Once | ORAL | Status: AC | PRN

## 2020-10-13 MED ORDER — CEFAZOLIN SODIUM-DEXTROSE 2-4 GM/100ML-% IV SOLN
INTRAVENOUS | Status: AC
  Filled 2020-10-13: qty 100

## 2020-10-13 MED ORDER — ONDANSETRON HCL 4 MG/2ML IJ SOLN: 4.0000 mg | Freq: Once | INTRAMUSCULAR | Status: DC | PRN

## 2020-10-13 MED ORDER — CHLORHEXIDINE GLUCONATE 4 % EX LIQD: 60.0000 mL | Freq: Once | CUTANEOUS | Status: DC

## 2020-10-13 MED ORDER — ACETAMINOPHEN 325 MG PO TABS: 650.0000 mg | ORAL_TABLET | ORAL | Status: DC | PRN

## 2020-10-13 MED ORDER — ROCURONIUM BROMIDE 10 MG/ML (PF) SYRINGE
PREFILLED_SYRINGE | INTRAVENOUS | Status: AC
  Filled 2020-10-13: qty 10

## 2020-10-13 MED ORDER — SODIUM CHLORIDE 0.9% FLUSH: 3.0000 mL | INTRAVENOUS | Status: DC | PRN

## 2020-10-13 MED ORDER — BUPIVACAINE-EPINEPHRINE 0.25% -1:200000 IJ SOLN
INTRAMUSCULAR | Status: DC | PRN
  Administered 2020-10-13: 30 mL

## 2020-10-13 MED ORDER — BUPIVACAINE-EPINEPHRINE 0.25% -1:200000 IJ SOLN
INTRAMUSCULAR | Status: AC
  Filled 2020-10-13: qty 1

## 2020-10-13 MED ORDER — PROPOFOL 10 MG/ML IV BOLUS
INTRAVENOUS | Status: DC | PRN
  Administered 2020-10-13: 150 mg via INTRAVENOUS

## 2020-10-13 MED ORDER — ORAL CARE MOUTH RINSE: 15.0000 mL | Freq: Once | OROMUCOSAL | Status: AC

## 2020-10-13 MED ORDER — ACETAMINOPHEN 500 MG PO TABS
1000.0000 mg | ORAL_TABLET | ORAL | Status: AC
  Administered 2020-10-13: 1000 mg via ORAL

## 2020-10-13 MED ORDER — MIDAZOLAM HCL 2 MG/2ML IJ SOLN
INTRAMUSCULAR | Status: DC | PRN
  Administered 2020-10-13: 2 mg via INTRAVENOUS

## 2020-10-13 MED ORDER — MIDAZOLAM HCL 2 MG/2ML IJ SOLN
INTRAMUSCULAR | Status: AC
  Filled 2020-10-13: qty 2

## 2020-10-13 MED ORDER — AMISULPRIDE (ANTIEMETIC) 5 MG/2ML IV SOLN: 10.0000 mg | Freq: Once | INTRAVENOUS | Status: DC | PRN

## 2020-10-13 MED ORDER — DEXAMETHASONE SODIUM PHOSPHATE 10 MG/ML IJ SOLN
INTRAMUSCULAR | Status: DC | PRN
  Administered 2020-10-13: 4 mg via INTRAVENOUS

## 2020-10-13 MED ORDER — SCOPOLAMINE 1 MG/3DAYS TD PT72: 1.0000 | MEDICATED_PATCH | TRANSDERMAL | Status: DC

## 2020-10-13 MED ORDER — DEXAMETHASONE SODIUM PHOSPHATE 10 MG/ML IJ SOLN
INTRAMUSCULAR | Status: AC
  Filled 2020-10-13: qty 1

## 2020-10-13 MED ORDER — TRAMADOL HCL 50 MG PO TABS: 50.0000 mg | ORAL_TABLET | Freq: Four times a day (QID) | ORAL | 0 refills | Status: DC | PRN

## 2020-10-13 MED ORDER — FENTANYL CITRATE (PF) 100 MCG/2ML IJ SOLN
INTRAMUSCULAR | Status: DC | PRN
  Administered 2020-10-13 (×2): 50 ug via INTRAVENOUS
  Administered 2020-10-13: 100 ug via INTRAVENOUS

## 2020-10-13 MED ORDER — BUPIVACAINE LIPOSOME 1.3 % IJ SUSP
INTRAMUSCULAR | Status: DC | PRN
  Administered 2020-10-13: 20 mL

## 2020-10-13 MED ORDER — ONDANSETRON HCL 4 MG/2ML IJ SOLN
INTRAMUSCULAR | Status: DC | PRN
  Administered 2020-10-13: 4 mg via INTRAVENOUS

## 2020-10-13 MED ORDER — LIDOCAINE 2% (20 MG/ML) 5 ML SYRINGE
INTRAMUSCULAR | Status: DC | PRN
  Administered 2020-10-13: 100 mg via INTRAVENOUS

## 2020-10-13 MED ORDER — PROPOFOL 10 MG/ML IV BOLUS
INTRAVENOUS | Status: AC
  Filled 2020-10-13: qty 20

## 2020-10-13 MED ORDER — IOHEXOL 300 MG/ML  SOLN
INTRAMUSCULAR | Status: DC | PRN
  Administered 2020-10-13: 80 mL

## 2020-10-13 MED ORDER — SUGAMMADEX SODIUM 200 MG/2ML IV SOLN
INTRAVENOUS | Status: DC | PRN
  Administered 2020-10-13: 150 mg via INTRAVENOUS

## 2020-10-13 MED ORDER — ROCURONIUM BROMIDE 10 MG/ML (PF) SYRINGE
PREFILLED_SYRINGE | INTRAVENOUS | Status: DC | PRN
  Administered 2020-10-13: 50 mg via INTRAVENOUS

## 2020-10-13 MED ORDER — CHLORHEXIDINE GLUCONATE 0.12 % MT SOLN
15.0000 mL | Freq: Once | OROMUCOSAL | Status: AC
  Administered 2020-10-13: 15 mL via OROMUCOSAL

## 2020-10-13 MED ORDER — ACETAMINOPHEN 650 MG RE SUPP
650.0000 mg | RECTAL | Status: DC | PRN
  Filled 2020-10-13: qty 1

## 2020-10-13 SURGICAL SUPPLY — 40 items
ADH SKN CLS APL DERMABOND .7 (GAUZE/BANDAGES/DRESSINGS) ×1
APL PRP STRL LF DISP 70% ISPRP (MISCELLANEOUS) ×1
APPLIER CLIP ROT 10 11.4 M/L (STAPLE) ×2
APR CLP MED LRG 11.4X10 (STAPLE) ×1
BAG SPEC RTRVL LRG 6X4 10 (ENDOMECHANICALS) ×1
CABLE HIGH FREQUENCY MONO STRZ (ELECTRODE) ×2 IMPLANT
CHLORAPREP W/TINT 26 (MISCELLANEOUS) ×2 IMPLANT
CLIP APPLIE ROT 10 11.4 M/L (STAPLE) ×1 IMPLANT
COVER MAYO STAND STRL (DRAPES) IMPLANT
COVER SURGICAL LIGHT HANDLE (MISCELLANEOUS) ×2 IMPLANT
COVER WAND RF STERILE (DRAPES) IMPLANT
DECANTER SPIKE VIAL GLASS SM (MISCELLANEOUS) ×1 IMPLANT
DERMABOND ADVANCED (GAUZE/BANDAGES/DRESSINGS) ×1
DERMABOND ADVANCED .7 DNX12 (GAUZE/BANDAGES/DRESSINGS) ×1 IMPLANT
DRAPE C-ARM 42X120 X-RAY (DRAPES) ×1 IMPLANT
ELECT REM PT RETURN 15FT ADLT (MISCELLANEOUS) ×2 IMPLANT
ENDOLOOP SUT PDS II  0 18 (SUTURE) ×2
ENDOLOOP SUT PDS II 0 18 (SUTURE) IMPLANT
GLOVE SURG ENC MOIS LTX SZ6 (GLOVE) ×2 IMPLANT
GLOVE SURG UNDER LTX SZ6.5 (GLOVE) ×2 IMPLANT
GOWN STRL REUS W/TWL LRG LVL3 (GOWN DISPOSABLE) ×2 IMPLANT
GOWN STRL REUS W/TWL XL LVL3 (GOWN DISPOSABLE) ×4 IMPLANT
GRASPER SUT TROCAR 14GX15 (MISCELLANEOUS) ×2 IMPLANT
HEMOSTAT SNOW SURGICEL 2X4 (HEMOSTASIS) IMPLANT
KIT BASIN OR (CUSTOM PROCEDURE TRAY) ×2 IMPLANT
KIT TURNOVER KIT A (KITS) ×2 IMPLANT
NEEDLE INSUFFLATION 14GA 120MM (NEEDLE) ×2 IMPLANT
PENCIL SMOKE EVACUATOR (MISCELLANEOUS) IMPLANT
POUCH SPECIMEN RETRIEVAL 10MM (ENDOMECHANICALS) ×2 IMPLANT
SCISSORS LAP 5X35 DISP (ENDOMECHANICALS) ×2 IMPLANT
SET CHOLANGIOGRAPH MIX (MISCELLANEOUS) ×1 IMPLANT
SET IRRIG TUBING LAPAROSCOPIC (IRRIGATION / IRRIGATOR) ×2 IMPLANT
SET TUBE SMOKE EVAC HIGH FLOW (TUBING) ×2 IMPLANT
SLEEVE XCEL OPT CAN 5 100 (ENDOMECHANICALS) ×5 IMPLANT
SUT MNCRL AB 4-0 PS2 18 (SUTURE) ×2 IMPLANT
TOWEL OR 17X26 10 PK STRL BLUE (TOWEL DISPOSABLE) ×2 IMPLANT
TOWEL OR NON WOVEN STRL DISP B (DISPOSABLE) IMPLANT
TRAY LAPAROSCOPIC (CUSTOM PROCEDURE TRAY) ×2 IMPLANT
TROCAR BLADELESS OPT 5 100 (ENDOMECHANICALS) ×2 IMPLANT
TROCAR XCEL 12X100 BLDLESS (ENDOMECHANICALS) ×2 IMPLANT

## 2020-10-13 NOTE — Anesthesia Procedure Notes (Signed)
Date/Time: 10/13/2020 4:39 PM Performed by: Minerva Ends, CRNA Oxygen Delivery Method: Simple face mask Placement Confirmation: positive ETCO2 and breath sounds checked- equal and bilateral Dental Injury: Teeth and Oropharynx as per pre-operative assessment

## 2020-10-13 NOTE — ED Triage Notes (Signed)
Pt states she is scheduled to have gallbladder out today @ 2:45pm but began having severe RUQ abd pain last night around 10:45pm. Denies n/v or fevers

## 2020-10-13 NOTE — Op Note (Signed)
Laparoscopic Cholecystectomy with IOC Procedure Note  Indications: This patient presents with longstanding symptomatic gallbladder disease with confirmed cholelithiasis and was evaluated in the emergency department just this morning with similar symptoms, lipasemia, and mild AST/ALT elevations. Given an associated mild leukocytosis and the severity of her symptoms, she is felt to have elements of both acute on chronic cholecystitis as well as gallstone pancreatitis. Her symptoms (pain, tenderness) have resolved since this morning and she is therefore recommended for laparoscopic cholecystectomy with intraoperative cholangiography to a) prevent further cholecystitis/pancreatitis and b) evaluate for choledocholithiasis.   Pre-operative Diagnosis: Biliary pancreatitis  Post-operative Diagnosis: Biliary pancreatitis  Surgeon: Dr. Phylliss Blakes, MD  Assistants: Dr. Luan Pulling, MD, PGY4  Anesthesia: General endotracheal anesthesia  ASA Class: 2  Procedure Details  The patient was seen again in the Holding Room. The risks, benefits, complications, treatment options, and expected outcomes were discussed with the patient. The possibilities of reaction to medication, pulmonary aspiration, perforation of viscus, bleeding, recurrent infection, finding a normal gallbladder, the need for additional procedures, failure to diagnose a condition, the possible need to convert to an open procedure, and creating a complication requiring transfusion or operation were discussed with the patient. The likelihood of improving the patient's symptoms with return to their baseline status is good.  The patient and/or family concurred with the proposed plan, giving informed consent. The site of surgery properly noted. The patient was taken to Operating Room, identified as Sophia Beard and the procedure verified as Laparoscopic Cholecystectomy with Intraoperative Cholangiogram. A Time Out was held and the above information  confirmed.  Prior to the induction of general anesthesia, antibiotic prophylaxis was administered. General endotracheal anesthesia was then administered and tolerated well. After the induction, the abdomen was prepped with Chloraprep and draped in the sterile fashion. The patient was positioned in the supine position.  Given the patient's prior surgical history, we made a 3mm LUQ incision in Palmer's point and insufflated the abdomen to on the first attempt using a Veress needle. We used a 81mm optiview trochar with a 71mm, 0-degree laparoscope to access the abdomen and conducted a survey to ensure that no injuries had been made upon entry. We swapped out for a 30-degree 19mm laparoscope and then placed additional ports: A 16mm camera port in the infraumbilical position, a 58mm port in the subxiphoid position, and two 84mm ports in the RUQ/right hemiabdomen. TAP blocks were performed laparoscopically with bupivacaine/liposomal bupivacaine under direct guidance.  We positioned the patient in reverse Trendelenburg, tilted slightly to the patient's left.  The gallbladder was identified, the fundus grasped and retracted cephalad. We found the gallbladder to be friable, edematous, and injected; compatible with acute cholecystitis. A small rent was made simply grasping the gallbladder by its fundus; this resulted in a small amount of normal-appearing bile and stones spilling which were recovered/suctioned until clear. We dissected the hepatocystic triangle of all fibrofatty tissue until two and only two structures were seen entering the gallbladder with 1/3 of the gallbladder dissected away from the gallbladder fossa. Satisfied with our critical view of safety, we made a distal cystic ductotomy and performed a cholangiogram with a Agricultural consultant. This revealed two or three small stones remaining in the cystic duct just proximal to our ductotomy with flow into the duodenum and retrograde up into the right  and left intrahepatic biliary ducts. Satisfied that there were no discernible filling defects in the common bile duct, we removed the catheter and performed additional dissection along our cystic  duct, encountering the expected small stones in the cystic duct. We clipped three times below these stones, divided the duct, and then clipped and divided the artery.  The gallbladder was dissected from the liver bed in retrograde fashion with the electrocautery. The gallbladder was removed and placed in a laparoscopic retrieval bag. The liver bed was irrigated and inspected. Hemostasis was achieved with the electrocautery. Copious irrigation was utilized and was repeatedly aspirated until clear.  The gallbladder and retrieval bag were then removed through the subxiphoid port site without additional dilation.  An endoclose device was used to reapproximate the edges of the 32mm fascial defect with a 0 Vicryl simple interrupted suture.  We again inspected the right upper quadrant for hemostasis.  Pneumoperitoneum was released as we removed the trocars.  4-0 Monocryl was used to close the skin.  Dermabond was applied. The patient was then extubated and brought to the recovery room in stable condition. Instrument, sponge, and needle counts were correct at closure and at the conclusion of the case.   Findings:  - edematous, injected gallbladder compatible with acute inflammation (I.e. acute cholecystitis)  - small amount of normal-appearing bile spillage; suctioned until clear  - multiple small stones spilled; recovered and removed with suction and graspers  - intraoperative cholangiogram negative for choledocholithiasis    Estimated Blood Loss: 5 mL         Drains: None         Specimens: Gallbladder           Complications: None; patient tolerated the procedure well.         Disposition: PACU - hemodynamically stable.         Condition: stable

## 2020-10-13 NOTE — Transfer of Care (Signed)
Immediate Anesthesia Transfer of Care Note  Patient: Sophia Beard  Procedure(s) Performed: LAPAROSCOPIC CHOLECYSTECTOMY WITH INTRAOPERATIVE CHOLANGIOGRAM (N/A Abdomen)  Patient Location: PACU  Anesthesia Type:General  Level of Consciousness: sedated  Airway & Oxygen Therapy: Patient Spontanous Breathing and Patient connected to face mask oxygen  Post-op Assessment: Report given to RN and Post -op Vital signs reviewed and stable  Post vital signs: Reviewed and stable  Last Vitals:  Vitals Value Taken Time  BP    Temp    Pulse 116 10/13/20 1645  Resp 19 10/13/20 1645  SpO2 100 % 10/13/20 1645    Last Pain:  Vitals:   10/13/20 1313  TempSrc:   PainSc: 0-No pain      Patients Stated Pain Goal: 3 (10/13/20 1313)  Complications: No complications documented.

## 2020-10-13 NOTE — Anesthesia Procedure Notes (Signed)
Procedure Name: Intubation Date/Time: 10/13/2020 3:08 PM Performed by: Niel Hummer, CRNA Pre-anesthesia Checklist: Emergency Drugs available, Patient identified, Suction available and Patient being monitored Patient Re-evaluated:Patient Re-evaluated prior to induction Oxygen Delivery Method: Circle system utilized Preoxygenation: Pre-oxygenation with 100% oxygen Induction Type: IV induction Ventilation: Mask ventilation without difficulty Laryngoscope Size: Mac and 4 Grade View: Grade I Tube type: Oral Tube size: 7.0 mm Number of attempts: 1 Airway Equipment and Method: Stylet Placement Confirmation: ETT inserted through vocal cords under direct vision,  positive ETCO2 and breath sounds checked- equal and bilateral Secured at: 22 cm Tube secured with: Tape Dental Injury: Teeth and Oropharynx as per pre-operative assessment

## 2020-10-13 NOTE — Discharge Instructions (Signed)
LAPAROSCOPIC SURGERY: POST OP INSTRUCTIONS   EAT Gradually transition to a high fiber diet with a fiber supplement over the next few weeks after discharge.  Start with a pureed / full liquid diet (see below)  WALK Walk an hour a day.  Control your pain to do that.    CONTROL PAIN Control pain so that you can walk, sleep, tolerate sneezing/coughing, go up/down stairs.  HAVE A BOWEL MOVEMENT DAILY Keep your bowels regular to avoid problems.  OK to try a laxative to override constipation.  OK to use an antidairrheal to slow down diarrhea.  Call if not better after 2 tries  CALL IF YOU HAVE PROBLEMS/CONCERNS Call if you are still struggling despite following these instructions. Call if you have concerns not answered by these instructions    1. DIET: Follow a light bland diet & liquids the first 24 hours after arrival home, such as soup, liquids, starches, etc.  Be sure to drink plenty of fluids.  Quickly advance to a usual solid diet within a few days.  Avoid fast food or heavy meals as your are more likely to get nauseated or have irregular bowels.  A low-sugar, high-fiber diet for the rest of your life is ideal.  2. Take your usually prescribed home medications unless otherwise directed.  3. PAIN CONTROL: a. Pain is best controlled by a usual combination of three different methods TOGETHER: i. Ice/Heat ii. Over the counter pain medication iii. Prescription pain medication b. Most patients will experience some swelling and bruising around the incisions.  Ice packs or heating pads (30-60 minutes up to 6 times a day) will help. Use ice for the first few days to help decrease swelling and bruising, then switch to heat to help relax tight/sore spots and speed recovery.  Some people prefer to use ice alone, heat alone, alternating between ice & heat.  Experiment to what works for you.  Swelling and bruising can take several weeks to resolve.   c. It is helpful to take an over-the-counter pain  medication regularly for the first few days: i. Naproxen (Aleve, etc)  Two 220mg tabs twice a day OR Ibuprofen (Advil, etc) Three 200mg tabs four times a day (every meal & bedtime) AND ii. Acetaminophen (Tylenol, etc) 500-650mg four times a day (every meal & bedtime) d. A  prescription for pain medication (such as oxycodone, hydrocodone, tramadol, gabapentin, methocarbamol, etc) should be given to you upon discharge.  Take your pain medication as prescribed, IF NEEDED.  i. If you are having problems/concerns with the prescription medicine (does not control pain, nausea, vomiting, rash, itching, etc), please call us [phone] to see if we need to switch you to a different pain medicine that will work better for you and/or control your side effect better. ii. If you need a refill on your pain medication, please give us 48 hour notice.  contact your pharmacy.  They will contact our office to request authorization. Prescriptions will not be filled after 5 pm or on week-ends  4. Avoid getting constipated.   a. Between the surgery and the pain medications, it is common to experience some constipation.   b. Increasing fluid intake and taking a fiber supplement (such as Metamucil, Citrucel, FiberCon, MiraLax, etc) 1-2 times a day regularly will usually help prevent this problem from occurring.   c. A mild laxative (prune juice, Milk of Magnesia, MiraLax, etc) should be taken according to package directions if there are no bowel movements after 48 hours.     5. Watch out for diarrhea.   a. If you have many loose bowel movements, simplify your diet to bland foods & liquids for a few days.   b. Stop any stool softeners and decrease your fiber supplement.   c. Switching to mild anti-diarrheal medications (Kayopectate, Pepto Bismol) can help.   d. If this worsens or does not improve, please call us.  6. Wash / shower every day.  You may shower over the skin glue which is waterproof  7. Glue will flake off  after about 2 weeks.  You may leave the incision open to air.  You may replace a dressing/Band-Aid to cover the incision for comfort if you wish.   8. ACTIVITIES as tolerated:   a. You may resume regular (light) daily activities beginning the next day--such as daily self-care, walking, climbing stairs--gradually increasing activities as tolerated.  If you can walk 30 minutes without difficulty, it is safe to try more intense activity such as jogging, treadmill, bicycling, low-impact aerobics, swimming, etc. b. Save the most intensive and strenuous activity for last such as sit-ups, heavy lifting, contact sports, etc  Refrain from any heavy lifting or straining until you are off narcotics for pain control.   c. DO NOT PUSH THROUGH PAIN.  Let pain be your guide: If it hurts to do something, don't do it.  Pain is your body warning you to avoid that activity for another week until the pain goes down. d. You may drive when you are no longer taking prescription pain medication, you can comfortably wear a seatbelt, and you can safely maneuver your car and apply brakes. e. You may have sexual intercourse when it is comfortable.  9. FOLLOW UP in our office a. Please call [facility] at [phone] to set up an appointment to see your surgeon in the office for a follow-up appointment approximately 2-3 weeks after your surgery. b. Make sure that you call for this appointment the day you arrive home to insure a convenient appointment time.  10. IF YOU HAVE DISABILITY OR FAMILY LEAVE FORMS, BRING THEM TO THE OFFICE FOR PROCESSING.  DO NOT GIVE THEM TO YOUR DOCTOR.   WHEN TO CALL US [phone]: 1. Poor pain control 2. Reactions / problems with new medications (rash/itching, nausea, etc)  3. Fever over 101.5 F (38.5 C) 4. Inability to urinate 5. Nausea and/or vomiting 6. Worsening swelling or bruising 7. Continued bleeding from incision. 8. Increased pain, redness, or drainage from the incision   The  clinic staff is available to answer your questions during regular business hours (8:30am-5pm).  Please don't hesitate to call and ask to speak to one of our nurses for clinical concerns.   If you have a medical emergency, go to the nearest emergency room or call 911.  A surgeon from [facility] is always on call at the hospitals   [facility] [address], [address], [place], [place]  [address] ? MAIN: [phone] ? TOLL FREE: [phone] ?  FAX [phone] [email or web address]   

## 2020-10-13 NOTE — ED Notes (Signed)
Pt states she is feeling better and is going to wait until her scheduled surgery this afternoon

## 2020-10-13 NOTE — Anesthesia Postprocedure Evaluation (Signed)
Anesthesia Post Note  Patient: Sophia Beard  Procedure(s) Performed: LAPAROSCOPIC CHOLECYSTECTOMY WITH INTRAOPERATIVE CHOLANGIOGRAM (N/A Abdomen)     Patient location during evaluation: PACU Anesthesia Type: General Level of consciousness: awake and alert Pain management: pain level controlled Vital Signs Assessment: post-procedure vital signs reviewed and stable Respiratory status: spontaneous breathing, nonlabored ventilation, respiratory function stable and patient connected to nasal cannula oxygen Cardiovascular status: blood pressure returned to baseline and stable Postop Assessment: no apparent nausea or vomiting Anesthetic complications: no   No complications documented.  Last Vitals:  Vitals:   10/13/20 1301 10/13/20 1645  BP: (!) 150/94 (!) 145/86  Pulse: 99 (!) 116  Resp: 18 19  Temp: 36.7 C   SpO2: 100% 100%    Last Pain:  Vitals:   10/13/20 1645  TempSrc:   PainSc: 0-No pain                 Trevor Iha

## 2020-10-13 NOTE — Interval H&P Note (Signed)
History and Physical Interval Note:  10/13/2020 2:13 PM  Sophia Beard  has presented today for surgery, with the diagnosis of BILIARY COLIC.  The various methods of treatment have been discussed with the patient and family. After consideration of risks, benefits and other options for treatment, the patient has consented to  Procedure(s): LAPAROSCOPIC CHOLECYSTECTOMY (N/A) as a surgical intervention.  The patient's history has been reviewed, patient examined, no change in status, stable for surgery.  I have reviewed the patient's chart and labs.  Questions were answered to the patient's satisfaction.     Irva Loser Lollie Sails

## 2020-10-13 NOTE — Progress Notes (Signed)
1000mg  tylenol  PO given not 1500 as appears in the [facility].

## 2020-10-14 ENCOUNTER — Encounter (HOSPITAL_COMMUNITY): Payer: Self-pay | Admitting: Surgery

## 2020-10-15 LAB — SURGICAL PATHOLOGY

## 2020-10-23 ENCOUNTER — Ambulatory Visit (INDEPENDENT_AMBULATORY_CARE_PROVIDER_SITE_OTHER): Payer: BC Managed Care – PPO | Admitting: Gastroenterology

## 2020-10-23 DIAGNOSIS — E538 Deficiency of other specified B group vitamins: Secondary | ICD-10-CM | POA: Diagnosis not present

## 2020-10-23 MED ORDER — CYANOCOBALAMIN 1000 MCG/ML IJ SOLN
1000.0000 ug | INTRAMUSCULAR | Status: DC
  Administered 2020-10-23 – 2022-09-19 (×23): 1000 ug via INTRAMUSCULAR

## 2020-11-09 ENCOUNTER — Telehealth: Payer: Self-pay | Admitting: Gastroenterology

## 2020-11-09 NOTE — Telephone Encounter (Signed)
Patient called said she is feeling really sick and canceled the appointment with Dr. Lavon Paganini for tomorrow. She requested to speak with a nurse about the Bupropion medication.

## 2020-11-09 NOTE — Telephone Encounter (Signed)
Patient has a URI. She cancelled the appointment though she does not think it is COVID. She feels the risk is never worth it. She is doing well otherwise. The appointment was for follow up and she thinks to discuss the beginning of a taper off budesonide. She has not had any upper GI symptoms since having her gallbladder removed. She will do a trial off Omeprazole. What do you advise on the taper of budesonide?

## 2020-11-10 ENCOUNTER — Other Ambulatory Visit: Payer: Self-pay

## 2020-11-10 ENCOUNTER — Ambulatory Visit: Payer: BC Managed Care – PPO | Admitting: Gastroenterology

## 2020-11-10 DIAGNOSIS — U071 COVID-19: Secondary | ICD-10-CM

## 2020-11-10 HISTORY — DX: COVID-19: U07.1

## 2020-11-10 MED ORDER — BUDESONIDE 3 MG PO CPEP: 3.0000 mg | ORAL_CAPSULE | Freq: Every day | ORAL | 0 refills | Status: DC

## 2020-11-10 NOTE — Telephone Encounter (Signed)
Called the patient. Left specific details on her voicemail. Also advised her to check the message I have sent to her through My Chart. I have provided written instructions.

## 2020-11-10 NOTE — Telephone Encounter (Signed)
Given she currently is feeling better and has symptoms to suggest Crohn's flare, will plan to taper Budesonide to 6mg  daily X 1 month and then 3 mg daily for additional 1 month. Follow up office visit next available and please advise pt to call with any changes. Thanks

## 2020-11-13 ENCOUNTER — Other Ambulatory Visit: Payer: Self-pay | Admitting: Gastroenterology

## 2020-12-01 ENCOUNTER — Ambulatory Visit (INDEPENDENT_AMBULATORY_CARE_PROVIDER_SITE_OTHER): Payer: BC Managed Care – PPO | Admitting: Gastroenterology

## 2020-12-01 DIAGNOSIS — E538 Deficiency of other specified B group vitamins: Secondary | ICD-10-CM

## 2020-12-13 ENCOUNTER — Other Ambulatory Visit: Payer: Self-pay | Admitting: Gastroenterology

## 2021-01-01 ENCOUNTER — Other Ambulatory Visit: Payer: Self-pay

## 2021-01-01 ENCOUNTER — Ambulatory Visit (INDEPENDENT_AMBULATORY_CARE_PROVIDER_SITE_OTHER): Payer: BC Managed Care – PPO | Admitting: Gastroenterology

## 2021-01-01 DIAGNOSIS — E538 Deficiency of other specified B group vitamins: Secondary | ICD-10-CM | POA: Diagnosis not present

## 2021-01-29 ENCOUNTER — Other Ambulatory Visit (HOSPITAL_COMMUNITY): Payer: Self-pay

## 2021-01-29 ENCOUNTER — Ambulatory Visit (INDEPENDENT_AMBULATORY_CARE_PROVIDER_SITE_OTHER): Payer: BC Managed Care – PPO | Admitting: Gastroenterology

## 2021-01-29 DIAGNOSIS — E538 Deficiency of other specified B group vitamins: Secondary | ICD-10-CM

## 2021-01-29 MED FILL — Cyanocobalamin Inj 1000 MCG/ML: INTRAMUSCULAR | 28 days supply | Qty: 4 | Fill #1 | Status: AC

## 2021-02-09 ENCOUNTER — Ambulatory Visit (INDEPENDENT_AMBULATORY_CARE_PROVIDER_SITE_OTHER): Payer: BC Managed Care – PPO | Admitting: Gastroenterology

## 2021-02-09 ENCOUNTER — Encounter: Payer: Self-pay | Admitting: Gastroenterology

## 2021-02-09 ENCOUNTER — Other Ambulatory Visit (INDEPENDENT_AMBULATORY_CARE_PROVIDER_SITE_OTHER): Payer: BC Managed Care – PPO

## 2021-02-09 VITALS — BP 104/80 | HR 92 | Ht 61.25 in | Wt 119.0 lb

## 2021-02-09 DIAGNOSIS — R197 Diarrhea, unspecified: Secondary | ICD-10-CM

## 2021-02-09 DIAGNOSIS — R109 Unspecified abdominal pain: Secondary | ICD-10-CM

## 2021-02-09 DIAGNOSIS — K5000 Crohn's disease of small intestine without complications: Secondary | ICD-10-CM

## 2021-02-09 DIAGNOSIS — Z23 Encounter for immunization: Secondary | ICD-10-CM | POA: Diagnosis not present

## 2021-02-09 LAB — CBC WITH DIFFERENTIAL/PLATELET
Basophils Absolute: 0.1 10*3/uL (ref 0.0–0.1)
Basophils Relative: 1 % (ref 0.0–3.0)
Eosinophils Absolute: 0.1 10*3/uL (ref 0.0–0.7)
Eosinophils Relative: 1.2 % (ref 0.0–5.0)
HCT: 35.6 % — ABNORMAL LOW (ref 36.0–46.0)
Hemoglobin: 11.1 g/dL — ABNORMAL LOW (ref 12.0–15.0)
Lymphocytes Relative: 24.1 % (ref 12.0–46.0)
Lymphs Abs: 2.1 10*3/uL (ref 0.7–4.0)
MCHC: 31.1 g/dL (ref 30.0–36.0)
MCV: 68.7 fl — ABNORMAL LOW (ref 78.0–100.0)
Monocytes Absolute: 0.6 10*3/uL (ref 0.1–1.0)
Monocytes Relative: 7.3 % (ref 3.0–12.0)
Neutro Abs: 5.7 10*3/uL (ref 1.4–7.7)
Neutrophils Relative %: 66.4 % (ref 43.0–77.0)
Platelets: 390 10*3/uL (ref 150.0–400.0)
RBC: 5.19 Mil/uL — ABNORMAL HIGH (ref 3.87–5.11)
RDW: 17.4 % — ABNORMAL HIGH (ref 11.5–15.5)
WBC: 8.6 10*3/uL (ref 4.0–10.5)

## 2021-02-09 LAB — COMPREHENSIVE METABOLIC PANEL
ALT: 15 U/L (ref 0–35)
AST: 15 U/L (ref 0–37)
Albumin: 3.9 g/dL (ref 3.5–5.2)
Alkaline Phosphatase: 53 U/L (ref 39–117)
BUN: 11 mg/dL (ref 6–23)
CO2: 24 mEq/L (ref 19–32)
Calcium: 8.8 mg/dL (ref 8.4–10.5)
Chloride: 105 mEq/L (ref 96–112)
Creatinine, Ser: 0.88 mg/dL (ref 0.40–1.20)
GFR: 82.54 mL/min (ref >60.00)
Glucose, Bld: 85 mg/dL (ref 70–99)
Potassium: 4.2 mEq/L (ref 3.5–5.1)
Sodium: 136 mEq/L (ref 135–145)
Total Bilirubin: 0.3 mg/dL (ref 0.2–1.2)
Total Protein: 7 g/dL (ref 6.0–8.3)

## 2021-02-09 LAB — IBC + FERRITIN
Ferritin: 3.9 ng/mL — ABNORMAL LOW (ref 10.0–291.0)
Iron: 10 ug/dL — ABNORMAL LOW (ref 42–145)
Saturation Ratios: 2.1 % — ABNORMAL LOW (ref 20.0–50.0)
TIBC: 471.8 ug/dL — ABNORMAL HIGH (ref 250.0–450.0)
Transferrin: 337 mg/dL (ref 212.0–360.0)

## 2021-02-09 LAB — B12 AND FOLATE PANEL
Folate: 8.7 ng/mL (ref >5.9)
Vitamin B-12: 241 pg/mL (ref 211–911)

## 2021-02-09 LAB — HIGH SENSITIVITY CRP: CRP, High Sensitivity: 2.2 mg/L (ref 0.000–5.000)

## 2021-02-09 LAB — SEDIMENTATION RATE: Sed Rate: 17 mm/hr (ref 0–20)

## 2021-02-09 MED ORDER — BUDESONIDE 3 MG PO CPEP: 9.0000 mg | ORAL_CAPSULE | Freq: Every day | ORAL | 0 refills | Status: DC

## 2021-02-09 NOTE — Patient Instructions (Addendum)
We have given you pneumovax today  Budesonide taper: 9 mg daily for 2 months then decrease to 6 mg for 1 month then decrease to 3 mg daily for 1 month  Your provider has requested that you go to the basement level for lab work before leaving today. Press "B" on the elevator. The lab is located at the first door on the left as you exit the elevator.    Beth McKew  will contact you about your Humira   Due to recent changes in healthcare laws, you may see the results of your imaging and laboratory studies on MyChart before your provider has had a chance to review them.  We understand that in some cases there may be results that are confusing or concerning to you. Not all laboratory results come back in the same time frame and the provider may be waiting for multiple results in order to interpret others.  Please give [place] 48 hours in order for your provider to thoroughly review all the results before contacting the office for clarification of your results.    If you are age 40 or older, your body mass index should be between 23-30. Your Body mass index is 22.3 kg/m. If this is out of the aforementioned range listed, please consider follow up with your Primary Care Provider.  If you are age 87 or younger, your body mass index should be between 19-25. Your Body mass index is 22.3 kg/m. If this is out of the aformentioned range listed, please consider follow up with your Primary Care Provider.   __________________________________________________________  The [facility] providers would like to encourage you to use [facility] to communicate with providers for non-urgent requests or questions.  Due to long hold times on the telephone, sending your provider a message by [facility] may be a faster and more efficient way to get a response.  Please allow 48 business hours for a response.  Please remember that this is for non-urgent requests.   Adalimumab Injection What is this medication? ADALIMUMAB (ay da LIM yoo  mab) is used to treat rheumatoid and psoriatic arthritis. It is also used to treat ankylosing spondylitis, Crohn's disease, ulcerative colitis, plaque psoriasis, hidradenitis suppurativa, and uveitis. This medicine may be used for other purposes; ask your health care provider or pharmacist if you have questions. COMMON BRAND NAME(S): CYLTEZO, Humira What should I tell my care team before I take this medication? They need to know if you have any of these conditions: cancer diabetes (high blood sugar) having surgery heart disease hepatitis B immune system problems infections, such as tuberculosis (TB) or other bacterial, fungal, or viral infections multiple sclerosis recent or upcoming vaccine an unusual reaction to adalimumab, mannitol, latex, rubber, other medicines, foods, dyes, or preservatives pregnant or trying to get pregnant breast-feeding How should I use this medication? This medicine is for injection under the skin. You will be taught how to prepare and give it. Take it as directed on the prescription label. Keep taking it unless your health care provider tells you to stop. It is important that you put your used needles and syringes in a special sharps container. Do not put them in a trash can. If you do not have a sharps container, call your pharmacist or health care provider to get one. This medicine comes with INSTRUCTIONS FOR USE. Ask your pharmacist for directions on how to use this medicine. Read the information carefully. Talk to your pharmacist or health care provider if you have questions.  A special MedGuide will be given to you by the pharmacist with each prescription and refill. Be sure to read this information carefully each time. Talk to your pediatrician regarding the use of this medicine in children. While this drug may be prescribed for children as young as 2 years for selected conditions, precautions do apply. Overdosage: If you think you have taken too much of this  medicine contact a poison control center or emergency room at once. NOTE: This medicine is only for you. Do not share this medicine with others. What if I miss a dose? If you miss a dose, take it as soon as you can. If it is almost time for your next dose, take only that dose. Do not take double or extra doses. It is important not to miss any doses. Talk to your health care provider about what to do if you miss a dose. What may interact with this medication? Do not take this medicine with any of the following medications: abatacept anakinra biologic medicines such as certolizumab, etanercept, golimumab, infliximab live virus vaccines This medicine may also interact with the following medications: cyclosporine theophylline vaccines warfarin This list may not describe all possible interactions. Give your health care provider a list of all the medicines, herbs, non-prescription drugs, or dietary supplements you use. Also tell them if you smoke, drink alcohol, or use illegal drugs. Some items may interact with your medicine. What should I watch for while using this medication? Visit your health care provider for regular checks on your progress. Tell your health care provider if your symptoms do not start to get better or if they get worse. You will be tested for tuberculosis (TB) before you start this medicine. If your doctor prescribes any medicine for TB, you should start taking the TB medicine before starting this medicine. Make sure to finish the full course of TB medicine. This medicine may increase your risk of getting an infection. Call your health care provider for advice if you get a fever, chills, sore throat, or other symptoms of a cold or flu. Do not treat yourself. Try to avoid being around people who are sick. Talk to your health care provider about your risk of cancer. You may be more at risk for certain types of cancer if you take this medicine. What side effects may I notice from  receiving this medication? Side effects that you should report to your doctor or health care professional as soon as possible: allergic reactions like skin rash, itching or hives, swelling of the face, lips, or tongue changes in vision chest pain dizziness heart failure (trouble breathing; fast, irregular heartbeat; sudden weight gain; swelling of the ankles, feet, hands; unusually weak or tired) infection (fever, chills, cough, sore throat, pain or trouble passing urine) liver injury (dark yellow or brown urine; general ill feeling or flu-like symptoms; loss of appetite, right upper belly pain; unusually weak or tired, yellowing of the eyes or skin) lump or swollen lymph nodes on the neck, groin, or underarm area muscle weakness pain, tingling, numbness in the hands or feet red, scaly patches or raised bumps on the skin trouble breathing unusual bleeding or bruising unusually weak or tired Side effects that usually do not require medical attention (report to your doctor or health care professional if they continue or are bothersome): headache nausea pain, redness, or irritation at site where injected stuffy or runny nose This list may not describe all possible side effects. Call your doctor for medical advice about  side effects. You may report side effects to FDA at 1-800-FDA-1088. Where should I keep my medication? Keep out of the reach of children and pets. Store in the refrigerator between 2 and 8 degrees C (36 and 46 degrees F). Do not freeze. Keep this medicine in the original packaging until you are ready to take it. Protect from light. Get rid of any unused medicine after the expiration date. This medicine may be stored at room temperature for up to 14 days. Keep this medicine in the original packaging. Protect from light. If it is stored at room temperature, get rid of any unused medicine after 14 days or after it expires, whichever is first. To get rid of medicines that are no  longer needed or have expired: Take the medicine to a medicine take-back program. Check with your pharmacy or law enforcement to find a location. If you cannot return the medicine, ask your pharmacist or health care provider how to get rid of this medicine safely. NOTE: This sheet is a summary. It may not cover all possible information. If you have questions about this medicine, talk to your doctor, pharmacist, or health care provider.  2022 Elsevier/Gold Standard (2019-07-25 17:28:40)    Thank you for choosing [facility]  Philbert Riser Nandigam,MD

## 2021-02-09 NOTE — Progress Notes (Signed)
Sophia Beard    [id number]    18-May-1981  Primary Care Physician:Shaw, Emily Filbert., MD  Referring Physician: Ginger Organ., MD [address] [place],  [place] [address]   Chief complaint: Crohn's disease  HPI:    40 year old very pleasant female here here for follow-up visit for Crohn's disease   She has been having increased bowel frequency and abdominal cramping since she tapered down budesonide Denies any melena or rectal bleeding  EGD and colonoscopy August 19, 2020  - LA Grade B reflux esophagitis with no bleeding. - Gastritis. Biopsied. - Normal examined duodenum. - The examined portion of the ileum was normal. Biopsied. - Patent end-to-side ileo-colonic anastomosis, characterized by healthy appearing mucosa. Biopsied. - Decreased mucosa vascular pattern in the transverse colon. Biopsied. - The rectum, sigmoid colon and descending colon are normal. Biopsied. Surgical [P], gastric antrum and gastric body - GASTRIC ANTRAL AND OXYNTIC MUCOSA WITH MILD CHRONIC GASTRITIS - WARTHIN STARRY STAIN IS NEGATIVE FOR HELICOBACTER PYLORI 2. Surgical [P], small bowel - PATCHY MILDLY ACTIVE CHRONIC ILEITIS, COMPATIBLE WITH PATIENT'S CLINICAL HISTORY OF CROHN'S DISEASE - NEGATIVE FOR GRANULOMAS OR DYSPLASIA 3. Surgical [P], colon, ileocolonic anastomosis - ILEAL MUCOSA WITH MILD ACUTE AND CHRONIC INFLAMMATION AND ARCHITECTURAL CHANGES, CONSISTENT WITH ANASTOMOTIC SITE - NEGATIVE FOR GRANULOMAS OR DYSPLASIA 4. Surgical [P], colon, transverse - COLONIC MUCOSA WITH NO SPECIFIC HISTOPATHOLOGIC CHANGES - NEGATIVE FOR ACUTE INFLAMMATION, FEATURES OF CHRONICITY, GRANULOMAS OR DYSPLASIA 5. Surgical [P], colon, rectum and sigmoid - COLONIC MUCOSA WITH PATCHY NONSPECIFIC HYPERPLASTIC CHANGES - NEGATIVE FOR ACUTE INFLAMMATION, FEATURES OF CHRONICITY, GRANULOMAS OR DYSPLASIA  She has had 2 miscarriages.  Has history of Raynaud's disease and protein S deficiency.   She  is currently not on any maintenance therapy for Crohn's disease.  She was on and off on Entocort and gentamicin in the past She was initially diagnosed with Crohn's disease, terminal ileal stricture s/p laparoscopically assisted ileal cecectomy for chronic small bowel obstruction on 04/21/2014 by Dr. Zella Richer.   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 Prior to the surgery and her CT scan of the abdomen in November 2015 showed 13 cm segment of terminal ileum with questionable abscess versus confined perforation.   Colonoscopy was 2006 and showed Crohn's disease at the ileocecal valve there were granulomas and crypt abscesses as well.   Outpatient Encounter Medications as of 02/09/2021  Medication Sig   buPROPion (WELLBUTRIN XL) 300 MG 24 hr tablet Take 300 mg by mouth in the morning.   cyanocobalamin (,VITAMIN B-12,) 1000 MCG/ML injection INJECT 1 ML INTO THE MUSCLE EVERY 7 DAYS (Patient taking differently: Inject 1,000 mcg into the muscle every 30 (thirty) days.)   dicyclomine (BENTYL) 20 MG tablet Take 1 tablet (20 mg total) by mouth every 8 (eight) hours as needed for spasms.   diphenhydrAMINE HCl, Sleep, (ZZZQUIL PO) Take 25-50 mg by mouth at bedtime.   omeprazole (PRILOSEC) 20 MG capsule Use Prilosec 30m by mouth daily, 30 minutes before breakfast times 90 tabs with 3 refills. (Patient taking differently: Take 20 mg by mouth daily before breakfast. Use Prilosec 238mby mouth daily, 30 minutes before breakfast times 90 tabs with 3 refills.)   ondansetron (ZOFRAN ODT) 4 MG disintegrating tablet Take 1 tablet (4 mg total) by mouth every 8 (eight) hours as needed for nausea or vomiting. Take per colonoscopy  prep instructions, then take as needed (Patient taking differently: Take 4 mg by mouth every 8 (eight) hours as needed for nausea or vomiting.)   traMADol (ULTRAM) 50 MG tablet Take 1 tablet (50 mg  total) by mouth every 6 (six) hours as needed.   [DISCONTINUED] budesonide (ENTOCORT EC) 3 MG 24 hr capsule TAKE 1 CAPSULE(3 MG) BY MOUTH DAILY   Facility-Administered Encounter Medications as of 02/09/2021  Medication   cyanocobalamin ((VITAMIN B-12)) injection 1,000 mcg    Allergies as of 02/09/2021 - Review Complete 02/09/2021  Allergen Reaction Noted   Iron dextran Anaphylaxis 12/20/2007   Sulfonamide derivatives Hives 12/17/2007    Past Medical History:  Diagnosis Date   Anal fissure    Anemia    not presently   Anxiety    Asthma    exercise induced   COVID-19    Crohn disease ([place])    Depression    Family history of adverse reaction to anesthesia    mother / sister have nausea   GERD (gastroesophageal reflux disease)    GI bleed    Headache    Ovarian cyst    PONV (postoperative nausea and vomiting)    Protein S deficiency ([place]) 11/2015   Raynaud's disease     Past Surgical History:  Procedure Laterality Date   CHOLECYSTECTOMY N/A 10/13/2020   Procedure: LAPAROSCOPIC CHOLECYSTECTOMY WITH INTRAOPERATIVE CHOLANGIOGRAM;  Surgeon: Clovis Riley, MD;  Location: [facility];  Service: General;  Laterality: N/A;   COLONOSCOPY     DILATION AND EVACUATION N/A 06/17/2016   Procedure: DILATATION AND EVACUATION;  Surgeon: Brien Few, MD;  Location: [facility];  Service: Gynecology;  Laterality: N/A;   ESOPHAGOGASTRODUODENOSCOPY ENDOSCOPY     LAPAROSCOPIC ILEOCECECTOMY N/A 04/21/2014   Procedure: LAPAROSCOPIC ASSISTED ILEOCYSTECTOMY;  Surgeon: Jackolyn Confer, MD;  Location: [facility];  Service: General;  Laterality: N/A;   WISDOM TOOTH EXTRACTION      Family History  Problem Relation Age of Onset   Colon cancer Paternal Uncle    Heart disease Paternal Grandfather    Irritable bowel syndrome Sister    Hypertension Mother    Esophageal cancer Neg Hx    Stomach cancer Neg Hx    Rectal cancer Neg Hx     Social History   Socioeconomic History   Marital status: Married     Spouse name: Not on file   Number of children: 2   Years of education: Not on file   Highest education level: Not on file  Occupational History   Occupation: Herbalist    Employer: [employer]  Tobacco Use   Smoking status: Never   Smokeless tobacco: Never  Vaping Use   Vaping Use: Never used  Substance and Sexual Activity   Alcohol use: No   Drug use: No   Sexual activity: Yes    Birth control/protection: None    Comment: married, 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   Physical Exam: Vitals:  02/09/21 1110  BP: 104/80  Pulse: 92   Body mass index is 22.3 kg/m. Gen:      No acute distress HEENT:  sclera anicteric Abd:      soft, non-tender; no palpable masses, no distension Ext:    No edema Neuro: alert and oriented x 3 Psych: normal mood and affect  Data Reviewed:  Reviewed labs, radiology imaging, old records and pertinent past GI work up   Assessment and Plan/Recommendations:  40 year old very pleasant female with history of Crohn's disease, is steroid-dependent She developed recurrent symptoms once budesonide was tapered off Will need biologic therapy to achieve remission Plan to start Humira once approved by insurance  Advised patient to restart budesonide 9 mg daily for 2 months followed by taper dose 6 mg daily for a month and 3 mg daily for additional month  IBD health maintenance: Follow-up CBC, CMP, B12, folate, iron panel and ESR Pneumococcal vaccine prior to starting immunosuppressive  therapy She also need flu vaccine and COVID booster  Return in 2 months or sooner if needed  The patient was provided an opportunity to ask questions and all were answered. The patient agreed with the plan and demonstrated an understanding of the instructions.  Damaris Hippo , MD    CC: Ginger Organ., MD

## 2021-02-12 LAB — QUANTIFERON-TB GOLD PLUS
Mitogen-NIL: >10 IU/mL
NIL: 0.04 IU/mL
QuantiFERON-TB Gold Plus: NEGATIVE
TB1-NIL: 0.01 IU/mL
TB2-NIL: 0.01 IU/mL

## 2021-02-26 ENCOUNTER — Encounter: Payer: Self-pay | Admitting: Gastroenterology

## 2021-03-01 ENCOUNTER — Ambulatory Visit (INDEPENDENT_AMBULATORY_CARE_PROVIDER_SITE_OTHER): Payer: BC Managed Care – PPO | Admitting: Gastroenterology

## 2021-03-01 ENCOUNTER — Telehealth: Payer: Self-pay

## 2021-03-01 DIAGNOSIS — E538 Deficiency of other specified B group vitamins: Secondary | ICD-10-CM | POA: Diagnosis not present

## 2021-03-01 NOTE — Telephone Encounter (Signed)
Patient was in the office today for monthly B12 injection. She has questions about Humira.  Was it approved by insurance?  She has not heard anything from anyone and would like to know next steps.

## 2021-03-02 NOTE — Telephone Encounter (Signed)
Insurance has not responded to the PA yet. We should know something by Thursday 03/04/21. Patient is enrolled in the [place] program for Humira. Patient is aware of this now.

## 2021-03-04 DIAGNOSIS — M25572 Pain in left ankle and joints of left foot: Secondary | ICD-10-CM | POA: Diagnosis not present

## 2021-03-04 DIAGNOSIS — M79674 Pain in right toe(s): Secondary | ICD-10-CM | POA: Diagnosis not present

## 2021-03-04 DIAGNOSIS — L603 Nail dystrophy: Secondary | ICD-10-CM | POA: Diagnosis not present

## 2021-03-04 DIAGNOSIS — S93402D Sprain of unspecified ligament of left ankle, subsequent encounter: Secondary | ICD-10-CM | POA: Diagnosis not present

## 2021-03-17 ENCOUNTER — Telehealth: Payer: Self-pay | Admitting: Gastroenterology

## 2021-03-17 ENCOUNTER — Other Ambulatory Visit: Payer: Self-pay

## 2021-03-17 MED ORDER — HUMIRA-CD/UC/HS STARTER 80 MG/0.8ML ~~LOC~~ AJKT: AUTO-INJECTOR | SUBCUTANEOUS | 0 refills | Status: DC

## 2021-03-17 MED ORDER — HUMIRA (2 PEN) 40 MG/0.4ML ~~LOC~~ AJKT: 1.0000 "pen " | AUTO-INJECTOR | SUBCUTANEOUS | 11 refills | Status: DC

## 2021-03-17 NOTE — Telephone Encounter (Signed)
What do you recommend?

## 2021-03-17 NOTE — Telephone Encounter (Signed)
We can try Pentasa but unfortunately it may not be adequate to improve the Crohn's given she is steroid dependent.  Please encourage her to start Humira, I am happy to given her a call if she has additional questions or concerns. Thanks

## 2021-03-17 NOTE — Telephone Encounter (Signed)
Called the patient. Left her a message with this information on her voicemail. Encouraged her to call back to discuss further.

## 2021-03-17 NOTE — Telephone Encounter (Signed)
Patient returned your call.  She is ready to proceed with the Humira but does not know which pharmacy it was called into.  Can you please call and advise.

## 2021-03-17 NOTE — Telephone Encounter (Signed)
Patient is advised of the [facility]. She will let me know when she receives the medication. She requests to take her first injections here in the office. She is concerned because of having a history of anaphylactic reaction in the past to iron dextran.  Sheri,  Is there any reason not to schedule her for her first injections here at the office? Would it be a charged visit?

## 2021-03-17 NOTE — Telephone Encounter (Signed)
Patient call and stated her insurance approved the Humira, but she wanted to know before she started the injections if she could try a pill like Pentasa to try to go into remission.  Please call and advise.

## 2021-03-18 NOTE — Telephone Encounter (Signed)
The patient will let me know once she has received the Humira. We can then schedule her with a nurse.

## 2021-03-18 NOTE — Telephone Encounter (Signed)
She can come to the office, but will require a nurse , not a CMA,  to instruct.  They are not familiar with the pens.  Won't be a charge.

## 2021-04-01 ENCOUNTER — Ambulatory Visit: Payer: BC Managed Care – PPO | Admitting: Gastroenterology

## 2021-04-01 ENCOUNTER — Encounter: Payer: Self-pay | Admitting: Gastroenterology

## 2021-04-01 VITALS — BP 104/66 | HR 64 | Ht 62.0 in | Wt 120.0 lb

## 2021-04-01 DIAGNOSIS — R110 Nausea: Secondary | ICD-10-CM

## 2021-04-01 DIAGNOSIS — R1012 Left upper quadrant pain: Secondary | ICD-10-CM | POA: Diagnosis not present

## 2021-04-01 DIAGNOSIS — K5000 Crohn's disease of small intestine without complications: Secondary | ICD-10-CM | POA: Diagnosis not present

## 2021-04-01 DIAGNOSIS — D500 Iron deficiency anemia secondary to blood loss (chronic): Secondary | ICD-10-CM

## 2021-04-01 DIAGNOSIS — K2990 Gastroduodenitis, unspecified, without bleeding: Secondary | ICD-10-CM

## 2021-04-01 DIAGNOSIS — K2970 Gastritis, unspecified, without bleeding: Secondary | ICD-10-CM

## 2021-04-01 DIAGNOSIS — E538 Deficiency of other specified B group vitamins: Secondary | ICD-10-CM

## 2021-04-01 MED ORDER — SUCRALFATE 1 G PO TABS: 1.0000 g | ORAL_TABLET | Freq: Two times a day (BID) | ORAL | 2 refills | Status: DC

## 2021-04-01 MED ORDER — PANTOPRAZOLE SODIUM 20 MG PO TBEC: 20.0000 mg | DELAYED_RELEASE_TABLET | Freq: Every day | ORAL | 2 refills | Status: DC

## 2021-04-01 MED ORDER — ONDANSETRON HCL 4 MG PO TABS: 4.0000 mg | ORAL_TABLET | Freq: Every day | ORAL | 1 refills | Status: DC | PRN

## 2021-04-01 NOTE — Progress Notes (Signed)
Sophia Beard    [id number]    [birth year]  Primary Care Physician:Shaw, Emily Filbert., MD  Referring Physician: Ginger Organ., MD [address] [place],  [place] [address]   Chief complaint:  Crohn's disease  HPI:  40 year old very pleasant female here here for follow-up visit for Crohn's disease She feels her symptoms are improving slowly, she is about 60 to 70% better but continues to have intermittent nausea and left upper quadrant abdominal pain/discomfort.  On average she is having 3 to 4 semiformed bowel movements per day, denies any blood in stool or dark stool.  She is back on budesonide taper, is at 6 mg daily Insurance approved Humira, she brought Humira injection with her to do her first induction dose today.  She is experiencing left lower quadrant abdominal pain and discomfort, no longer has right-sided pain which improved after cholecystectomy in May 2022.  She is having intermittent nausea but no vomiting.   EGD and colonoscopy August 19, 2020  - LA Grade B reflux esophagitis with no bleeding. - Gastritis. Biopsied. - Normal examined duodenum. - The examined portion of the ileum was normal. Biopsied. - Patent end-to-side ileo-colonic anastomosis, characterized by healthy appearing mucosa. Biopsied. - Decreased mucosa vascular pattern in the transverse colon. Biopsied. - The rectum, sigmoid colon and descending colon are normal. Biopsied. Surgical [P], gastric antrum and gastric body - GASTRIC ANTRAL AND OXYNTIC MUCOSA WITH MILD CHRONIC GASTRITIS - WARTHIN STARRY STAIN IS NEGATIVE FOR HELICOBACTER PYLORI 2. Surgical [P], small bowel - PATCHY MILDLY ACTIVE CHRONIC ILEITIS, COMPATIBLE WITH PATIENT'S CLINICAL HISTORY OF CROHN'S DISEASE - NEGATIVE FOR GRANULOMAS OR DYSPLASIA 3. Surgical [P], colon, ileocolonic anastomosis - ILEAL MUCOSA WITH MILD ACUTE AND CHRONIC INFLAMMATION AND ARCHITECTURAL CHANGES, CONSISTENT WITH ANASTOMOTIC SITE -  NEGATIVE FOR GRANULOMAS OR DYSPLASIA 4. Surgical [P], colon, transverse - COLONIC MUCOSA WITH NO SPECIFIC HISTOPATHOLOGIC CHANGES - NEGATIVE FOR ACUTE INFLAMMATION, FEATURES OF CHRONICITY, GRANULOMAS OR DYSPLASIA 5. Surgical [P], colon, rectum and sigmoid - COLONIC MUCOSA WITH PATCHY NONSPECIFIC HYPERPLASTIC CHANGES - NEGATIVE FOR ACUTE INFLAMMATION, FEATURES OF CHRONICITY, GRANULOMAS OR DYSPLASIA   She has had 2 miscarriages.  Has history of Raynaud's disease and protein S deficiency.   She was on and off on Entocort and gentamicin in the past She was initially diagnosed with Crohn's disease, terminal ileal stricture s/p laparoscopically assisted ileal cecectomy for chronic small bowel obstruction on 04/21/2014 by Dr. Zella Richer.   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 Prior to the surgery and her CT scan of the abdomen in November 2015 showed 13 cm segment of terminal ileum with questionable abscess versus confined perforation.   Colonoscopy was 2006 and showed Crohn's disease at the ileocecal valve there were granulomas and crypt abscesses as well.   Outpatient Encounter Medications as of 04/01/2021  Medication Sig   Adalimumab (HUMIRA PEN) 40 MG/0.4ML PNKT Inject 1 pen into the skin every 14 (fourteen) days.   Adalimumab (HUMIRA PEN-CD/UC/HS STARTER) 80 MG/0.8ML PNKT 160 mg day 1 then 80 mg day 15 then 40 mg day 29 and every other week thereafter   budesonide (ENTOCORT EC) 3 MG 24 hr capsule Take 3 capsules (9 mg total) by mouth daily. Take 9 mg daily for 2 months then decrease to 6 mg daily for 1 month and then decrease to 3 mg for 1 month  buPROPion (WELLBUTRIN XL) 300 MG 24 hr tablet Take 300 mg by mouth in the morning.   cyanocobalamin (,VITAMIN B-12,) 1000 MCG/ML injection INJECT 1 ML INTO THE MUSCLE EVERY 7 DAYS (Patient taking differently: Inject 1,000 mcg into the muscle every 30  (thirty) days.)   dicyclomine (BENTYL) 20 MG tablet Take 1 tablet (20 mg total) by mouth every 8 (eight) hours as needed for spasms.   diphenhydrAMINE HCl, Sleep, (ZZZQUIL PO) Take 25-50 mg by mouth at bedtime.   ondansetron (ZOFRAN ODT) 4 MG disintegrating tablet Take 1 tablet (4 mg total) by mouth every 8 (eight) hours as needed for nausea or vomiting. Take per colonoscopy prep instructions, then take as needed (Patient taking differently: Take 4 mg by mouth every 8 (eight) hours as needed for nausea or vomiting.)   [DISCONTINUED] omeprazole (PRILOSEC) 20 MG capsule Use Prilosec 72m by mouth daily, 30 minutes before breakfast times 90 tabs with 3 refills. (Patient taking differently: Take 20 mg by mouth daily before breakfast. Use Prilosec 291mby mouth daily, 30 minutes before breakfast times 90 tabs with 3 refills.)   [DISCONTINUED] traMADol (ULTRAM) 50 MG tablet Take 1 tablet (50 mg total) by mouth every 6 (six) hours as needed.   Facility-Administered Encounter Medications as of 04/01/2021  Medication   cyanocobalamin ((VITAMIN B-12)) injection 1,000 mcg    Allergies as of 04/01/2021 - Review Complete 04/01/2021  Allergen Reaction Noted   Iron dextran Anaphylaxis 12/20/2007   Sulfonamide derivatives Hives 12/17/2007    Past Medical History:  Diagnosis Date   Anal fissure    Anemia    not presently   Anxiety    Asthma    exercise induced   COVID-19    [place]   Depression    Family history of adverse reaction to anesthesia    mother / sister have nausea   GERD (gastroesophageal reflux disease)    GI bleed    Headache    Ovarian cyst    PONV (postoperative nausea and vomiting)    [place] S [place] [place]   Raynaud's disease     Past Surgical History:  Procedure Laterality Date   CHOLECYSTECTOMY N/A 10/13/2020   Procedure: LAPAROSCOPIC CHOLECYSTECTOMY WITH INTRAOPERATIVE CHOLANGIOGRAM;  Surgeon: CoClovis RileyMD;  [facility]: [facility];  Service:  General;  Laterality: N/A;   COLONOSCOPY     DILATION AND EVACUATION N/A 06/17/2016   Procedure: DILATATION AND EVACUATION;  Surgeon: RiBrien FewMD;  [facility];  Service: Gynecology;  Laterality: N/A;   ESOPHAGOGASTRODUODENOSCOPY ENDOSCOPY     LAPAROSCOPIC ILEOCECECTOMY N/A 04/21/2014   Procedure: LAPAROSCOPIC ASSISTED ILEOCYSTECTOMY;  Surgeon: ToJackolyn ConferMD;  [facility];  Service: General;  Laterality: N/A;   WISDOM TOOTH EXTRACTION      Family History  Problem Relation Age of Onset   Colon cancer Paternal Uncle    Heart disease Paternal Grandfather    Irritable bowel syndrome Sister    Hypertension Mother    Esophageal cancer Neg Hx    Stomach cancer Neg Hx    Rectal cancer Neg Hx     Social History   Socioeconomic History   Marital status: Married    Spouse name: Not on file   Number of children: 2   Years of education: Not on file   Highest education level: Not on file  Occupational History   Occupation: leHerbalist  Employer: [employer]  Tobacco Use   Smoking status: Never  Smokeless tobacco: Never  Vaping Use   Vaping Use: Never used  Substance and Sexual Activity   Alcohol use: No   Drug use: No   Sexual activity: Yes    Birth control/protection: None    Comment: married, 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   Physical Exam: Vitals:   04/01/21 0846  BP: 104/66  Pulse: 64   Body mass index is 21.95 kg/m. Gen:      No acute distress HEENT:  sclera anicteric Abd:       soft, non-tender; no palpable masses, no distension Ext:    No edema Neuro: alert and oriented x 3 Psych: normal mood and affect  Data Reviewed:  Reviewed labs, radiology imaging, old records and pertinent past GI work up   Assessment and Plan/Recommendations:  40 year old very pleasant female with history of Crohn's disease, is steroid-dependent She developed recurrent symptoms once budesonide was tapered off, symptoms improved when she was started back on budesonide but not completely resolved  Continue budesonide taper, 6 mg daily for 30 days followed by 3 mg daily for additional 30 days Humira was approved by insurance, she is going to do her first induction dose today  Vitamin B12 deficiency: Continue B12 injections  Iron deficiency: Developed allergic reaction to iron dextran.  She is using chewable iron with vitamin C  We will recheck iron panel in 3 months  IBD health maintenance: Follow-up CBC, CMP and ESR in 4 weeks She is up-to-date with vaccination  Left upper quadrant abdominal discomfort likely secondary to gastritis: Use Carafate 1 g twice daily for 4 weeks Has persistent symptoms, start low-dose PPI pantoprazole 20 mg daily  Nausea: Use Zofran 4 mg daily as needed  Return in 6 to 8 weeks   The patient was provided an opportunity to ask questions and all were answered. The patient agreed with the plan and demonstrated an understanding of the instructions.  Damaris Hippo , MD    CC: Ginger Organ., MD

## 2021-04-01 NOTE — Patient Instructions (Signed)
You will be due for labs in 1 month, Around 12/3. Orders are in and you are also scheduled for your B12 injection the same day at 11:30am  We will send Carafate,Zofran and Pantoprazole to your pharmacy  Continue Budesonide as directed  Continue Humeria  You was given your monthly B12 injection today  Due to recent changes in healthcare laws, you may see the results of your imaging and laboratory studies on MyChart before your provider has had a chance to review them.  We understand that in some cases there may be results that are confusing or concerning to you. Not all laboratory results come back in the same time frame and the provider may be waiting for multiple results in order to interpret others.  Please give [place] 48 hours in order for your provider to thoroughly review all the results before contacting the office for clarification of your results.    If you are age 37 or older, your body mass index should be between 23-30. Your Body mass index is 21.95 kg/m. If this is out of the aforementioned range listed, please consider follow up with your Primary Care Provider.  If you are age 62 or younger, your body mass index should be between 19-25. Your Body mass index is 21.95 kg/m. If this is out of the aformentioned range listed, please consider follow up with your Primary Care Provider.   ________________________________________________________  The [facility] providers would like to encourage you to use [facility] to communicate with providers for non-urgent requests or questions.  Due to long hold times on the telephone, sending your provider a message by [facility] may be a faster and more efficient way to get a response.  Please allow 48 business hours for a response.  Please remember that this is for non-urgent requests.  _______________________________________________________   I appreciate the  opportunity to care for you  Thank You   Marsa Aris , MD

## 2021-05-03 ENCOUNTER — Ambulatory Visit (INDEPENDENT_AMBULATORY_CARE_PROVIDER_SITE_OTHER): Payer: BC Managed Care – PPO | Admitting: Gastroenterology

## 2021-05-03 DIAGNOSIS — K5000 Crohn's disease of small intestine without complications: Secondary | ICD-10-CM

## 2021-05-03 DIAGNOSIS — E538 Deficiency of other specified B group vitamins: Secondary | ICD-10-CM

## 2021-05-26 ENCOUNTER — Telehealth: Payer: Self-pay | Admitting: Gastroenterology

## 2021-05-26 NOTE — Telephone Encounter (Signed)
Patient needs to schedule her B-12 shot.  She says she usually does it around the 5th or 6th of the month and like between 11-11:30.  Please reach out to patient and let her know if/when this can be scheduled.  Thank you.

## 2021-05-26 NOTE — Telephone Encounter (Signed)
Made B12 injection for 1/6 at 11am  Patient aware

## 2021-06-03 ENCOUNTER — Other Ambulatory Visit (HOSPITAL_COMMUNITY): Payer: Self-pay

## 2021-06-03 MED FILL — Cyanocobalamin Inj 1000 MCG/ML: INTRAMUSCULAR | 28 days supply | Qty: 4 | Fill #2 | Status: AC

## 2021-06-04 ENCOUNTER — Other Ambulatory Visit (HOSPITAL_COMMUNITY): Payer: Self-pay

## 2021-06-04 ENCOUNTER — Ambulatory Visit (INDEPENDENT_AMBULATORY_CARE_PROVIDER_SITE_OTHER): Payer: BC Managed Care – PPO | Admitting: Gastroenterology

## 2021-06-04 DIAGNOSIS — E538 Deficiency of other specified B group vitamins: Secondary | ICD-10-CM

## 2021-06-04 DIAGNOSIS — D519 Vitamin B12 deficiency anemia, unspecified: Secondary | ICD-10-CM

## 2021-06-23 ENCOUNTER — Encounter: Payer: Self-pay | Admitting: Gastroenterology

## 2021-06-23 ENCOUNTER — Other Ambulatory Visit (INDEPENDENT_AMBULATORY_CARE_PROVIDER_SITE_OTHER): Payer: BC Managed Care – PPO

## 2021-06-23 ENCOUNTER — Ambulatory Visit: Payer: BC Managed Care – PPO | Admitting: Gastroenterology

## 2021-06-23 VITALS — BP 100/64 | HR 105 | Ht 61.5 in | Wt 126.0 lb

## 2021-06-23 DIAGNOSIS — G8929 Other chronic pain: Secondary | ICD-10-CM

## 2021-06-23 DIAGNOSIS — K50919 Crohn's disease, unspecified, with unspecified complications: Secondary | ICD-10-CM

## 2021-06-23 DIAGNOSIS — E538 Deficiency of other specified B group vitamins: Secondary | ICD-10-CM | POA: Diagnosis not present

## 2021-06-23 DIAGNOSIS — R519 Headache, unspecified: Secondary | ICD-10-CM | POA: Diagnosis not present

## 2021-06-23 DIAGNOSIS — D509 Iron deficiency anemia, unspecified: Secondary | ICD-10-CM

## 2021-06-23 LAB — CBC WITH DIFFERENTIAL/PLATELET
Basophils Absolute: 0.1 10*3/uL (ref 0.0–0.1)
Basophils Relative: 0.8 % (ref 0.0–3.0)
Eosinophils Absolute: 0.1 10*3/uL (ref 0.0–0.7)
Eosinophils Relative: 1 % (ref 0.0–5.0)
HCT: 42.7 % (ref 36.0–46.0)
Hemoglobin: 13.7 g/dL (ref 12.0–15.0)
Lymphocytes Relative: 28.2 % (ref 12.0–46.0)
Lymphs Abs: 2.6 10*3/uL (ref 0.7–4.0)
MCHC: 32.2 g/dL (ref 30.0–36.0)
MCV: 77.6 fl — ABNORMAL LOW (ref 78.0–100.0)
Monocytes Absolute: 0.8 10*3/uL (ref 0.1–1.0)
Monocytes Relative: 8.4 % (ref 3.0–12.0)
Neutro Abs: 5.6 10*3/uL (ref 1.4–7.7)
Neutrophils Relative %: 61.6 % (ref 43.0–77.0)
Platelets: 341 10*3/uL (ref 150.0–400.0)
RBC: 5.5 Mil/uL — ABNORMAL HIGH (ref 3.87–5.11)
RDW: 18 % — ABNORMAL HIGH (ref 11.5–15.5)
WBC: 9.1 10*3/uL (ref 4.0–10.5)

## 2021-06-23 LAB — IBC + FERRITIN
Ferritin: 14.9 ng/mL (ref 10.0–291.0)
Iron: 43 ug/dL (ref 42–145)
Saturation Ratios: 9.8 % — ABNORMAL LOW (ref 20.0–50.0)
TIBC: 438.2 ug/dL (ref 250.0–450.0)
Transferrin: 313 mg/dL (ref 212.0–360.0)

## 2021-06-23 LAB — COMPREHENSIVE METABOLIC PANEL
ALT: 12 U/L (ref 0–35)
AST: 15 U/L (ref 0–37)
Albumin: 4.1 g/dL (ref 3.5–5.2)
Alkaline Phosphatase: 53 U/L (ref 39–117)
BUN: 8 mg/dL (ref 6–23)
CO2: 26 mEq/L (ref 19–32)
Calcium: 8.8 mg/dL (ref 8.4–10.5)
Chloride: 102 mEq/L (ref 96–112)
Creatinine, Ser: 0.93 mg/dL (ref 0.40–1.20)
GFR: 77.05 mL/min (ref >60.00)
Glucose, Bld: 86 mg/dL (ref 70–99)
Potassium: 3.7 mEq/L (ref 3.5–5.1)
Sodium: 136 mEq/L (ref 135–145)
Total Bilirubin: 0.3 mg/dL (ref 0.2–1.2)
Total Protein: 7.1 g/dL (ref 6.0–8.3)

## 2021-06-23 LAB — VITAMIN D 25 HYDROXY (VIT D DEFICIENCY, FRACTURES): VITD: 18.22 ng/mL — ABNORMAL LOW (ref 30.00–100.00)

## 2021-06-23 NOTE — Progress Notes (Signed)
Sophia Beard    OC:[id number]    08-27-1980  Primary Care Physician:Shaw, Emily Filbert., MD  Referring Physician: Ginger Organ., MD [address] [place],  [place] [address]   Chief complaint:  Headaches, Crohn's disease  HPI: 41 year old very pleasant female here here for follow-up visit for Crohn's disease She feels her symptoms are improving slowly.  On average she is having 3 to 4 semiformed bowel movements per day, denies any blood in stool or dark stool.   She started having severe headaches, not sure if it is related to iron deficiency anemia due to Humira.  She was getting bad headaches when she was severely iron deficient in the past      EGD and colonoscopy August 19, 2020  - LA Grade B reflux esophagitis with no bleeding. - Gastritis. Biopsied. - Normal examined duodenum. - The examined portion of the ileum was normal. Biopsied. - Patent end-to-side ileo-colonic anastomosis, characterized by healthy appearing mucosa. Biopsied. - Decreased mucosa vascular pattern in the transverse colon. Biopsied. - The rectum, sigmoid colon and descending colon are normal. Biopsied. Surgical [P], gastric antrum and gastric body - GASTRIC ANTRAL AND OXYNTIC MUCOSA WITH MILD CHRONIC GASTRITIS - WARTHIN STARRY STAIN IS NEGATIVE FOR HELICOBACTER PYLORI 2. Surgical [P], small bowel - PATCHY MILDLY ACTIVE CHRONIC ILEITIS, COMPATIBLE WITH PATIENT'S CLINICAL HISTORY OF CROHN'S DISEASE - NEGATIVE FOR GRANULOMAS OR DYSPLASIA 3. Surgical [P], colon, ileocolonic anastomosis - ILEAL MUCOSA WITH MILD ACUTE AND CHRONIC INFLAMMATION AND ARCHITECTURAL CHANGES, CONSISTENT WITH ANASTOMOTIC SITE - NEGATIVE FOR GRANULOMAS OR DYSPLASIA 4. Surgical [P], colon, transverse - COLONIC MUCOSA WITH NO SPECIFIC HISTOPATHOLOGIC CHANGES - NEGATIVE FOR ACUTE INFLAMMATION, FEATURES OF CHRONICITY, GRANULOMAS OR DYSPLASIA 5. Surgical [P], colon, rectum and sigmoid - COLONIC MUCOSA WITH PATCHY  NONSPECIFIC HYPERPLASTIC CHANGES - NEGATIVE FOR ACUTE INFLAMMATION, FEATURES OF CHRONICITY, GRANULOMAS OR DYSPLASIA   She has had 2 miscarriages.  Has history of Raynaud's disease and protein S deficiency.   She was on and off on Entocort and gentamicin in the past She was initially diagnosed with Crohn's disease, terminal ileal stricture s/p laparoscopically assisted ileal cecectomy for chronic small bowel obstruction on 04/21/2014 by Dr. Zella Richer.   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 Prior to the surgery and her CT scan of the abdomen in November 2015 showed 13 cm segment of terminal ileum with questionable abscess versus confined perforation.   Colonoscopy was 2006 and showed Crohn's disease at the ileocecal valve there were granulomas and crypt abscesses as well.     Outpatient Encounter Medications as of 06/23/2021  Medication Sig   Adalimumab (HUMIRA PEN) 40 MG/0.4ML PNKT Inject 1 pen into the skin every 14 (fourteen) days.   budesonide (ENTOCORT EC) 3 MG 24 hr capsule Take 3 capsules (9 mg total) by mouth daily. Take 9 mg daily for 2 months then decrease to 6 mg daily for 1 month and then decrease to 3 mg for 1 month   buPROPion (WELLBUTRIN XL) 300 MG 24 hr tablet Take 300 mg by mouth in the morning.   cyanocobalamin (,VITAMIN B-12,) 1000 MCG/ML injection INJECT 1 ML INTO THE MUSCLE EVERY 7 DAYS (Patient taking differently: Inject 1,000 mcg into the muscle every 30 (thirty) days.)   dicyclomine (BENTYL) 20 MG tablet Take 1 tablet (20 mg total) by mouth every 8 (eight) hours as  needed for spasms.   diphenhydrAMINE HCl, Sleep, (ZZZQUIL PO) Take 25-50 mg by mouth at bedtime.   ondansetron (ZOFRAN ODT) 4 MG disintegrating tablet Take 1 tablet (4 mg total) by mouth every 8 (eight) hours as needed for nausea or vomiting. Take per colonoscopy prep instructions, then take as needed (Patient  taking differently: Take 4 mg by mouth every 8 (eight) hours as needed for nausea or vomiting.)   Adalimumab (HUMIRA PEN-CD/UC/HS STARTER) 80 MG/0.8ML PNKT 160 mg day 1 then 80 mg day 15 then 40 mg day 29 and every other week thereafter   [DISCONTINUED] ondansetron (ZOFRAN) 4 MG tablet Take 1 tablet (4 mg total) by mouth daily as needed for nausea or vomiting. (Patient not taking: Reported on 06/23/2021)   [DISCONTINUED] pantoprazole (PROTONIX) 20 MG tablet Take 1 tablet (20 mg total) by mouth daily. As needed (Patient not taking: Reported on 06/23/2021)   [DISCONTINUED] sucralfate (CARAFATE) 1 g tablet Take 1 tablet (1 g total) by mouth 2 (two) times daily. (Patient not taking: Reported on 06/23/2021)   Facility-Administered Encounter Medications as of 06/23/2021  Medication   cyanocobalamin ((VITAMIN B-12)) injection 1,000 mcg    Allergies as of 06/23/2021 - Review Complete 06/23/2021  Allergen Reaction Noted   Iron dextran Anaphylaxis 12/20/2007   Sulfonamide derivatives Hives 12/17/2007    Past Medical History:  Diagnosis Date   Anal fissure    Anemia    not presently   Anxiety    Asthma    exercise induced   COVID-19    Crohn disease ([place])    Depression    Family history of adverse reaction to anesthesia    mother / sister have nausea   GERD (gastroesophageal reflux disease)    GI bleed    Headache    Ovarian cyst    PONV (postoperative nausea and vomiting)    Protein S deficiency ([place]) 11/2015   Raynaud's disease     Past Surgical History:  Procedure Laterality Date   CHOLECYSTECTOMY N/A 10/13/2020   Procedure: LAPAROSCOPIC CHOLECYSTECTOMY WITH INTRAOPERATIVE CHOLANGIOGRAM;  Surgeon: Clovis Riley, MD;  Location: [facility];  Service: General;  Laterality: N/A;   COLONOSCOPY     DILATION AND EVACUATION N/A 06/17/2016   Procedure: DILATATION AND EVACUATION;  Surgeon: Brien Few, MD;  Location: [facility];  Service: Gynecology;  Laterality: N/A;    ESOPHAGOGASTRODUODENOSCOPY ENDOSCOPY     LAPAROSCOPIC ILEOCECECTOMY N/A 04/21/2014   Procedure: LAPAROSCOPIC ASSISTED ILEOCYSTECTOMY;  Surgeon: Jackolyn Confer, MD;  Location: [facility];  Service: General;  Laterality: N/A;   WISDOM TOOTH EXTRACTION      Family History  Problem Relation Age of Onset   Colon cancer Paternal Uncle    Heart disease Paternal Grandfather    Irritable bowel syndrome Sister    Hypertension Mother    Esophageal cancer Neg Hx    Stomach cancer Neg Hx    Rectal cancer Neg Hx     Social History   Socioeconomic History   Marital status: Married    Spouse name: Shanon Brow   Number of children: 2   Years of education: Not on file   Highest education level: Not on file  Occupational History   Occupation: self employed Water quality scientist  Tobacco Use   Smoking status: Never   Smokeless tobacco: Never  Vaping Use   Vaping Use: Never used  Substance and Sexual Activity   Alcohol use: No   Drug use: No   Sexual activity: Yes    Birth  control/protection: None    Comment: married, 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   Physical Exam: Vitals:   06/23/21 1049  BP: 100/64  Pulse: (!) 105   Body mass index is 23.42 kg/m. Gen:      No acute distress HEENT:  sclera anicteric Abd:      soft, non-tender; no palpable masses, no distension Ext:    No edema Neuro: alert and oriented x 3 Psych: normal mood and affect  Data Reviewed:  Reviewed labs, radiology imaging, old records and pertinent past GI work up   Assessment  and Plan/Recommendations:  41 year old very pleasant female with history of Crohn's disease, steroid-dependent, currently on Humira maintenance dose with improvement of Crohn's symptoms.  Clinically she is stable with no evidence of acute Crohn's flare   She is having severe headaches, will send referral to neurology for further evaluation and management of possible migraine headaches   Iron deficiency anemia: We will send referral for IV iron infusion, given she has persistent severe iron deficiency despite oral iron supplements and inability to tolerate higher dose of oral iron supplements  B12 deficiency: Continue B12 injections   IBD health maintenance: Follow-up CBC, CMP, B12, folate, iron panel and vitamin D    Return in 3 months or sooner if needed  This visit required 40 minutes of patient care (this includes precharting, chart review, review of results, face-to-face time used for counseling as well as treatment plan and follow-up. The patient was provided an opportunity to ask questions and all were answered. The patient agreed with the plan and demonstrated an understanding of the instructions.  Damaris Hippo , MD    CC: Ginger Organ., MD

## 2021-06-23 NOTE — Patient Instructions (Addendum)
Your provider has requested that you go to the basement level for lab work before leaving today. Press "B" on the elevator. The lab is located at the first door on the left as you exit the elevator.   Continue Monthly B12 injection.   Continue Humira  Referral was sent to [facility]. Their will contact you for an appointment within 1 week.   Follow up in 3 months. Office will contact you to schedule.   If you are age 41 or older, your body mass index should be between 23-30. Your Body mass index is 23.42 kg/m. If this is out of the aforementioned range listed, please consider follow up with your Primary Care Provider.  If you are age 92 or younger, your body mass index should be between 19-25. Your Body mass index is 23.42 kg/m. If this is out of the aformentioned range listed, please consider follow up with your Primary Care Provider.   ________________________________________________________  The [facility] providers would like to encourage you to use [facility] to communicate with providers for non-urgent requests or questions.  Due to long hold times on the telephone, sending your provider a message by [facility] may be a faster and more efficient way to get a response.  Please allow 48 business hours for a response.  Please remember that this is for non-urgent requests.  _______________________________________________________  ,It was a pleasure to see you today!  Thank you for trusting me with your gastrointestinal care!

## 2021-07-06 ENCOUNTER — Ambulatory Visit (INDEPENDENT_AMBULATORY_CARE_PROVIDER_SITE_OTHER): Payer: BC Managed Care – PPO | Admitting: Gastroenterology

## 2021-07-06 DIAGNOSIS — K5000 Crohn's disease of small intestine without complications: Secondary | ICD-10-CM

## 2021-07-06 DIAGNOSIS — E538 Deficiency of other specified B group vitamins: Secondary | ICD-10-CM | POA: Insufficient documentation

## 2021-07-07 ENCOUNTER — Encounter: Payer: Self-pay | Admitting: Gastroenterology

## 2021-08-09 ENCOUNTER — Ambulatory Visit (INDEPENDENT_AMBULATORY_CARE_PROVIDER_SITE_OTHER): Payer: BC Managed Care – PPO | Admitting: Gastroenterology

## 2021-08-09 DIAGNOSIS — K5000 Crohn's disease of small intestine without complications: Secondary | ICD-10-CM

## 2021-08-09 DIAGNOSIS — E538 Deficiency of other specified B group vitamins: Secondary | ICD-10-CM

## 2021-09-01 DIAGNOSIS — Z01419 Encounter for gynecological examination (general) (routine) without abnormal findings: Secondary | ICD-10-CM | POA: Diagnosis not present

## 2021-09-01 DIAGNOSIS — Z6822 Body mass index (BMI) 22.0-22.9, adult: Secondary | ICD-10-CM | POA: Diagnosis not present

## 2021-09-01 DIAGNOSIS — Z1231 Encounter for screening mammogram for malignant neoplasm of breast: Secondary | ICD-10-CM | POA: Diagnosis not present

## 2021-09-01 LAB — HM MAMMOGRAPHY

## 2021-09-14 ENCOUNTER — Ambulatory Visit (INDEPENDENT_AMBULATORY_CARE_PROVIDER_SITE_OTHER): Payer: BC Managed Care – PPO | Admitting: Gastroenterology

## 2021-09-14 DIAGNOSIS — E538 Deficiency of other specified B group vitamins: Secondary | ICD-10-CM | POA: Diagnosis not present

## 2021-10-13 ENCOUNTER — Other Ambulatory Visit (HOSPITAL_COMMUNITY): Payer: Self-pay

## 2021-10-13 ENCOUNTER — Other Ambulatory Visit: Payer: Self-pay | Admitting: Gastroenterology

## 2021-10-13 MED ORDER — CYANOCOBALAMIN 1000 MCG/ML IJ SOLN
INTRAMUSCULAR | 4 refills | Status: DC
  Filled 2021-10-13: qty 4, 28d supply, fill #0
  Filled 2022-02-13: qty 4, 28d supply, fill #1

## 2021-10-14 ENCOUNTER — Ambulatory Visit (INDEPENDENT_AMBULATORY_CARE_PROVIDER_SITE_OTHER): Payer: BC Managed Care – PPO | Admitting: Gastroenterology

## 2021-10-14 DIAGNOSIS — E538 Deficiency of other specified B group vitamins: Secondary | ICD-10-CM | POA: Diagnosis not present

## 2021-11-15 ENCOUNTER — Ambulatory Visit (INDEPENDENT_AMBULATORY_CARE_PROVIDER_SITE_OTHER): Payer: BC Managed Care – PPO | Admitting: Gastroenterology

## 2021-11-15 DIAGNOSIS — E538 Deficiency of other specified B group vitamins: Secondary | ICD-10-CM

## 2021-12-13 ENCOUNTER — Ambulatory Visit (INDEPENDENT_AMBULATORY_CARE_PROVIDER_SITE_OTHER): Payer: BC Managed Care – PPO | Admitting: Gastroenterology

## 2021-12-13 DIAGNOSIS — E538 Deficiency of other specified B group vitamins: Secondary | ICD-10-CM

## 2022-01-14 ENCOUNTER — Ambulatory Visit (INDEPENDENT_AMBULATORY_CARE_PROVIDER_SITE_OTHER): Payer: BC Managed Care – PPO | Admitting: Gastroenterology

## 2022-01-14 DIAGNOSIS — E538 Deficiency of other specified B group vitamins: Secondary | ICD-10-CM

## 2022-01-30 IMAGING — RF DG CHOLANGIOGRAM OPERATIVE
1 series · 10 of 10 positions shown · non-contrast
Comparison: CT 09/30/2020

CLINICAL DATA: Cholelithiasis.  Abdominal pain, Crohn's disease

EXAM:
INTRAOPERATIVE CHOLANGIOGRAM
TECHNIQUE: Cholangiographic images from the C-arm fluoroscopic device were
submitted for interpretation post-operatively. Please see the
procedural report for the amount of contrast and the fluoroscopy
time utilized.

[Series 1: run · 4 acquisitions, 10 frames shown]
[im 1/4]
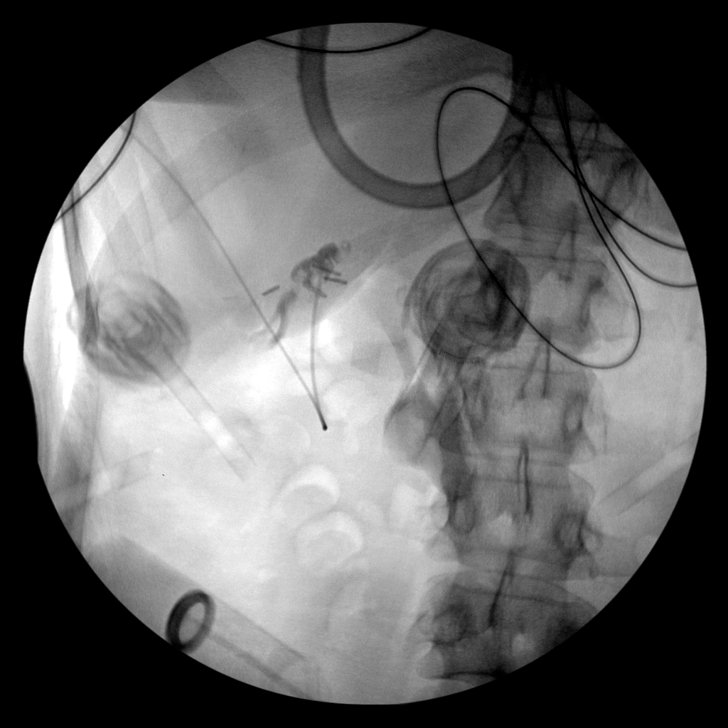
[im 1/4]
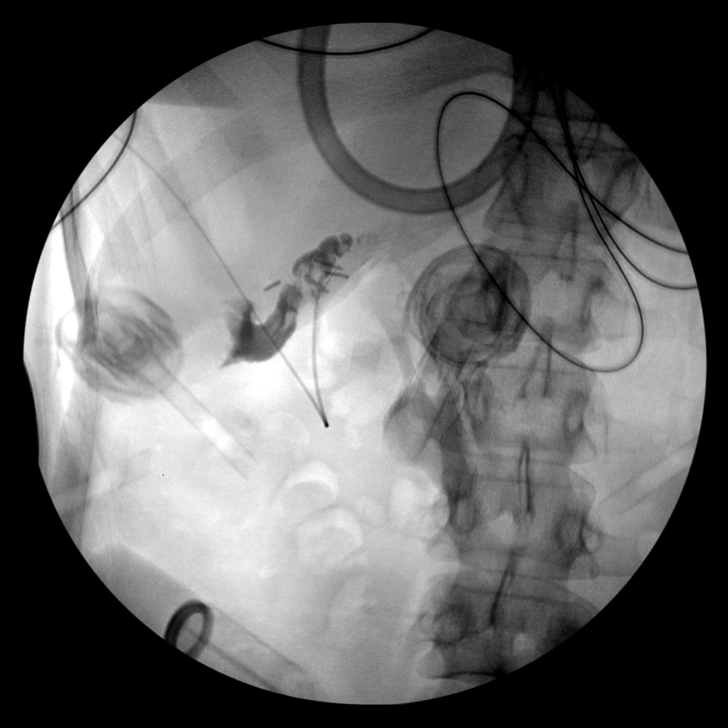
[im 1/4]
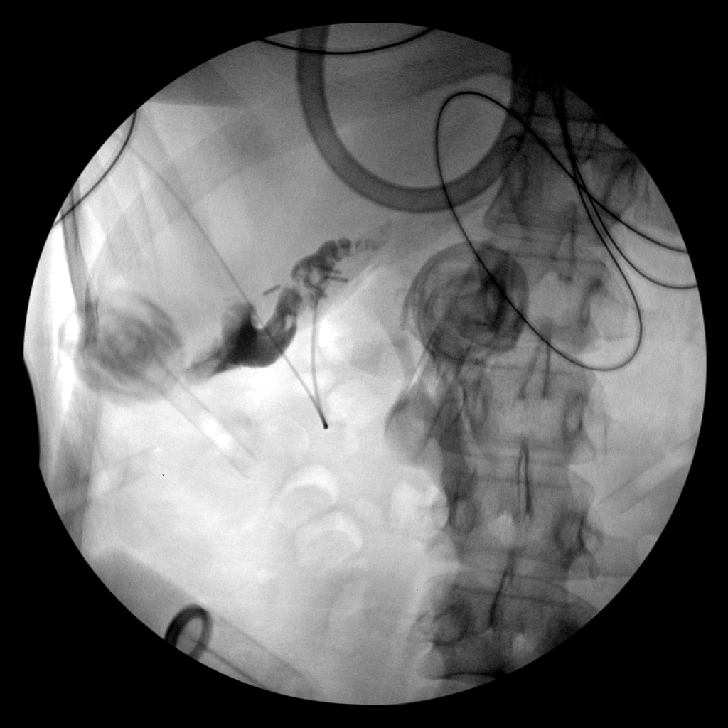
[im 1/4]
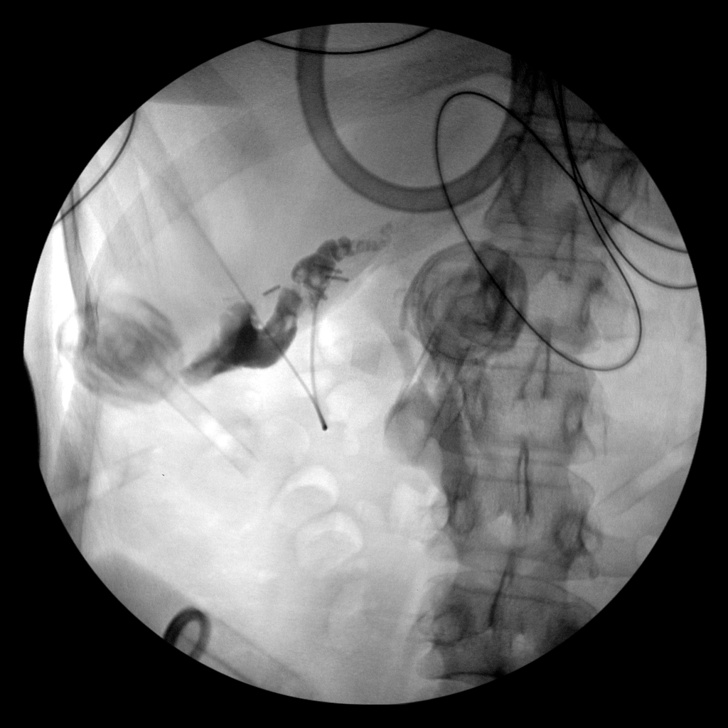
[im 2/4]
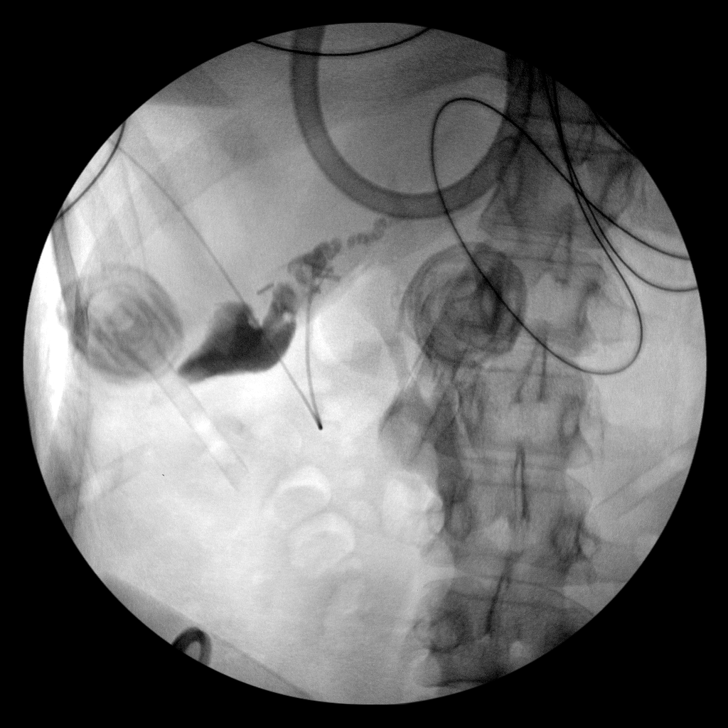
[im 3/4]
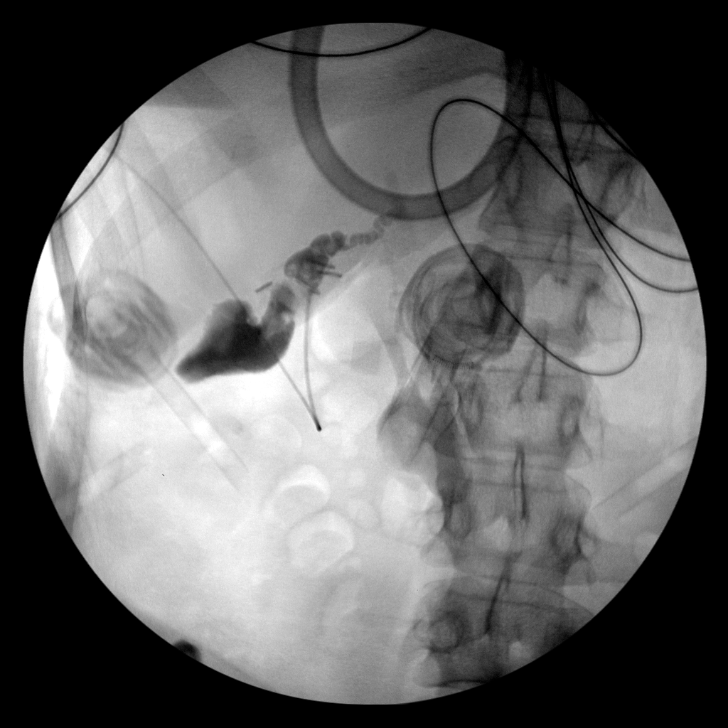
[im 3/4]
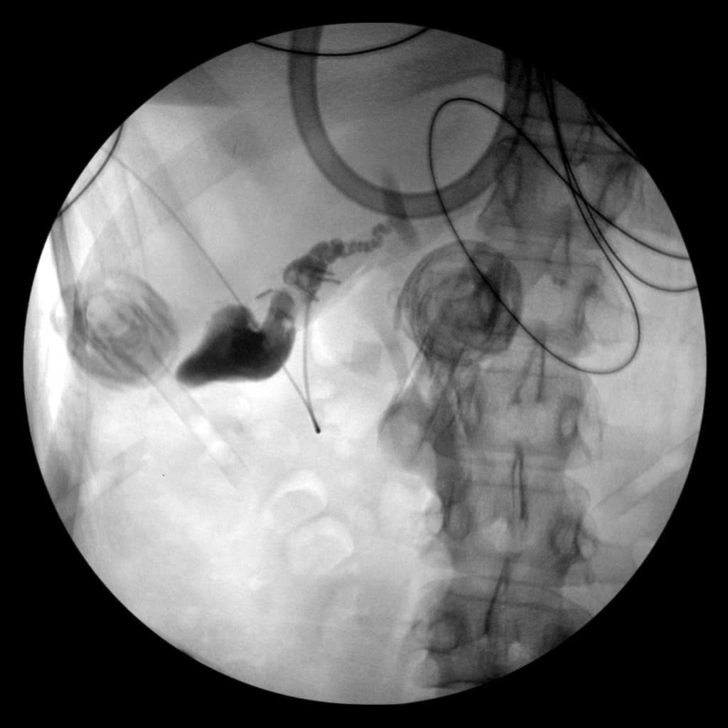
[im 3/4]
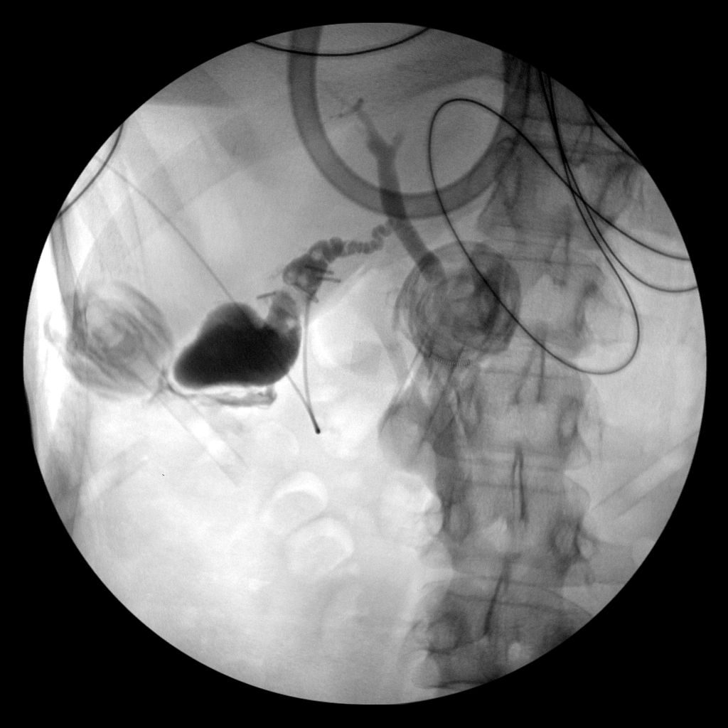
[im 3/4]
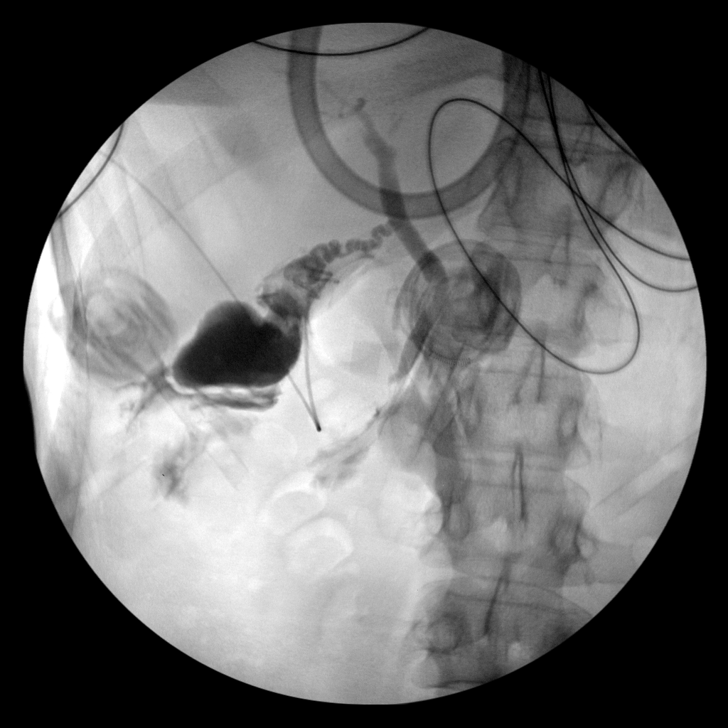
[im 4/4]
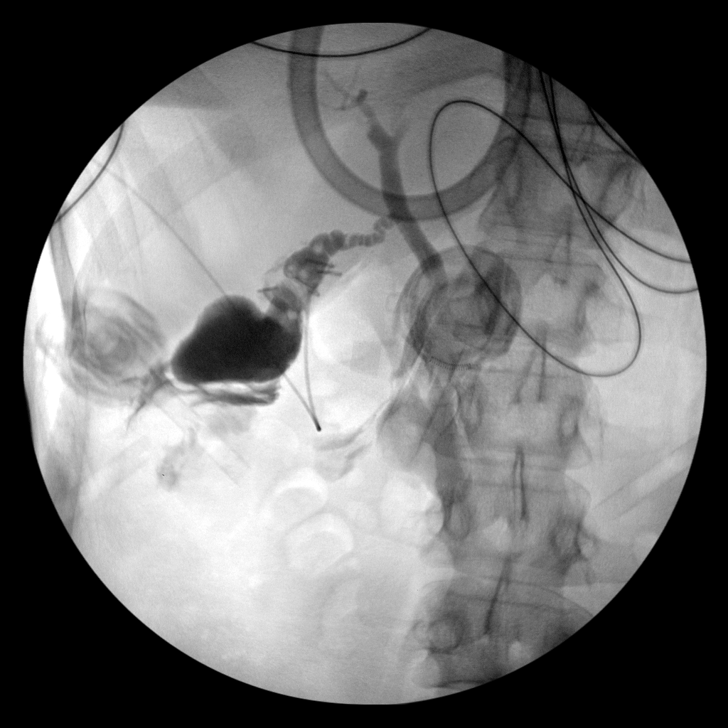

[10 of 10 positions shown; findings below may reference images not displayed]

FINDINGS: Mobile filling defects in the proximal cystic duct near the
gallbladder may represent small stones or bubbles. No filling
defects in the common duct. Intrahepatic ducts are incompletely
visualized, appearing decompressed centrally. Contrast passes into
the duodenum.

:
Negative for retained common duct stone.

Findings were discussed with Dr. Yusuri over the phone at the time
of interpretation.

## 2022-02-14 ENCOUNTER — Other Ambulatory Visit (HOSPITAL_COMMUNITY): Payer: Self-pay

## 2022-02-14 ENCOUNTER — Ambulatory Visit (INDEPENDENT_AMBULATORY_CARE_PROVIDER_SITE_OTHER): Payer: BC Managed Care – PPO | Admitting: Gastroenterology

## 2022-02-14 DIAGNOSIS — K5000 Crohn's disease of small intestine without complications: Secondary | ICD-10-CM

## 2022-02-14 DIAGNOSIS — E538 Deficiency of other specified B group vitamins: Secondary | ICD-10-CM

## 2022-03-07 ENCOUNTER — Ambulatory Visit: Payer: BC Managed Care – PPO | Admitting: Gastroenterology

## 2022-03-16 ENCOUNTER — Ambulatory Visit (INDEPENDENT_AMBULATORY_CARE_PROVIDER_SITE_OTHER): Payer: BC Managed Care – PPO | Admitting: Gastroenterology

## 2022-03-16 DIAGNOSIS — K5000 Crohn's disease of small intestine without complications: Secondary | ICD-10-CM | POA: Diagnosis not present

## 2022-03-16 DIAGNOSIS — E538 Deficiency of other specified B group vitamins: Secondary | ICD-10-CM

## 2022-03-16 NOTE — Patient Instructions (Signed)
Tolerated well. Next appointment scheduled.

## 2022-03-23 DIAGNOSIS — L309 Dermatitis, unspecified: Secondary | ICD-10-CM | POA: Diagnosis not present

## 2022-04-15 ENCOUNTER — Encounter: Payer: Self-pay | Admitting: Family Medicine

## 2022-04-15 DIAGNOSIS — G479 Sleep disorder, unspecified: Secondary | ICD-10-CM | POA: Insufficient documentation

## 2022-04-15 DIAGNOSIS — E559 Vitamin D deficiency, unspecified: Secondary | ICD-10-CM | POA: Insufficient documentation

## 2022-04-15 NOTE — Progress Notes (Unsigned)
Patient ID: Sophia Beard, female  DOB: June 24, 1980, 41 y.o.   MRN: [id number] Patient Care Team    Relationship Specialty Notifications Start End  Natalia Leatherwood, DO PCP - General Family Medicine  04/15/22   Olivia Mackie, MD Referring Physician Obstetrics and Gynecology  11/25/15   Napoleon Form, MD Consulting Physician Gastroenterology  04/15/22     No chief complaint on file.   Subjective:  Sophia Beard is a 41 y.o.  female present for new patient establishment. All past medical history, surgical history, allergies, family history, immunizations, medications and social history were *** in the electronic medical record today. All recent labs, ED visits and hospitalizations within the last year were reviewed.  Health maintenance:  Colonoscopy: completed ***, by ***, resutls ***. follow up ***. Mammogram: completed:***, birads ***.  Cervical cancer screening: last pap: ***, results: ***, completed by: *** Immunizations: tdap ***, Influenza *** (encouraged yearly), PNA series ***, zostavax *** Infectious disease screening: HIV ***, Hep C *** DEXA: last completed ***, result ***, follow needed *** PSA: No results found for: "PSA" Assistive device: *** Oxygen use:**** Patient has a Dental home. Hospitalizations/ED visits:      No data to display             No data to display                    No data to display           Immunization History  Administered Date(s) Administered   Influenza-Unspecified 05/02/2016   PFIZER(Purple Top)SARS-COV-2 Vaccination 08/16/2019, 09/18/2019   Pneumococcal Polysaccharide-23 02/09/2021    No results found.  Past Medical History:  Diagnosis Date   Anal fissure    Anemia    not presently   Anxiety    Asthma    exercise induced   COVID-19    Crohn disease ([facility])    Ileocecetomy 2015   Depression    Family history of adverse reaction to anesthesia    mother / sister have nausea   GERD  (gastroesophageal reflux disease)    GI bleed    Headache    Ovarian cyst    PONV (postoperative nausea and vomiting)    Protein S deficiency ([facility]) 11/2015   Raynaud's disease    Allergies  Allergen Reactions   Iron Dextran Anaphylaxis   Sulfonamide Derivatives Hives   Past Surgical History:  Procedure Laterality Date   CHOLECYSTECTOMY N/A 10/13/2020   Procedure: LAPAROSCOPIC CHOLECYSTECTOMY WITH INTRAOPERATIVE CHOLANGIOGRAM;  Surgeon: Berna Bue, MD;  Location: [facility];  Service: General;  Laterality: N/A;   COLONOSCOPY  2022   DILATION AND EVACUATION N/A 06/17/2016   Procedure: DILATATION AND EVACUATION;  Surgeon: Olivia Mackie, MD;  Location: [facility];  Service: Gynecology;  Laterality: N/A;   ESOPHAGOGASTRODUODENOSCOPY ENDOSCOPY     ILEOCECETOMY  2015   Crohn s disease   LAPAROSCOPIC ILEOCECECTOMY N/A 04/21/2014   Procedure: LAPAROSCOPIC ASSISTED ILEOCYSTECTOMY;  Surgeon: Avel Peace, MD;  Location: [facility];  Service: General;  Laterality: N/A;   WISDOM TOOTH EXTRACTION     Family History  Problem Relation Age of Onset   Heart disease Mother    Depression Mother    Cancer Mother    Hypertension Mother    Hypertension Father    Cancer Father    Miscarriages / [place] Sister    Depression Sister    Asthma Sister    Irritable bowel syndrome Sister  Depression Daughter    Asthma Daughter    Asthma Daughter    Colon cancer Paternal Uncle    Heart disease Paternal Grandfather    Esophageal cancer Neg Hx    Stomach cancer Neg Hx    Rectal cancer Neg Hx    Social History   Social History Narrative   Marital status/children/pets: Married, 2 children   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 If you have any questions, ask your nurse or doctor.          buPROPion 300 MG 24 hr tablet Commonly known as: WELLBUTRIN XL Take 300 mg by mouth in the morning.   cyanocobalamin 1000 MCG/ML injection Commonly known as: Dodex INJECT 1 ML INTO THE MUSCLE EVERY 7 DAYS   dicyclomine 20 MG tablet Commonly known as: BENTYL Take 1 tablet (20 mg total) by mouth every 8 (eight) hours as needed for spasms.   diphenhydrAMINE HCl (Sleep) 25 MG Caps Take by mouth.   Humira Pen 40 MG/0.4ML Pnkt Generic drug: Adalimumab Inject 1 pen into the skin every 14 (fourteen) days.   zolpidem 6.25 MG CR tablet Commonly known as: AMBIEN CR Take 6.25 mg by mouth at bedtime as needed for sleep.        All past medical history, surgical history, allergies, family history, immunizations andmedications were updated in the EMR today and reviewed under the history and medication portions of their EMR.    No results found for this or any previous visit (from the past 2160 hour(s)).  DG Cholangiogram Operative  Result Date: 10/13/2020 CLINICAL DATA:  Cholelithiasis.  Abdominal pain, Crohn's disease EXAM: INTRAOPERATIVE CHOLANGIOGRAM TECHNIQUE: Cholangiographic images from the C-arm fluoroscopic device were submitted for interpretation post-operatively. Please see the procedural report for the amount of contrast and the fluoroscopy time utilized. COMPARISON:  CT 09/30/2020 FINDINGS: Mobile filling defects in the proximal cystic duct near the gallbladder may represent small stones or bubbles. No filling defects in the common duct. Intrahepatic ducts are incompletely visualized, appearing decompressed centrally. Contrast passes into the duodenum. : Negative for retained common duct stone. Findings were discussed with Dr. Fredricka Bonine over the phone at the time of interpretation. Electronically Signed   By: Corlis Leak M.D.   On: 10/13/2020 16:39     ROS 14 pt review  of systems performed and negative (unless mentioned in an HPI)  Objective:  There were no vitals taken for this visit.  Physical Exam      Assessment/plan: Sophia Beard is a 41 y.o. female present for ***   Patient was encouraged to exercise greater than 150 minutes a week. Patient was encouraged to choose a diet filled with fresh fruits and vegetables, and lean meats. AVS provided to patient today for education/recommendation on gender specific health and safety maintenance. No follow-ups on file.  No orders of the defined types were placed in this encounter.  No orders of the defined types were placed in this encounter.  Referral Orders  No referral(s) requested today  Note is dictated utilizing voice recognition software. Although note has been proof read prior to signing, occasional typographical errors still can be missed. If any questions arise, please do not hesitate to call for verification.  Electronically signed by: Howard Pouch, DO [place]

## 2022-04-15 NOTE — Patient Instructions (Incomplete)
Return in about 24 weeks (around 10/03/2022) for cpe (20 min), Routine chronic condition follow-up.   Nurse visit every 4 weeks for b12 injections.       Great to see you today.  I have refilled the medication(s) we provide.   If labs were collected, we will inform you of lab results once received either by echart message or telephone call.   - echart message- for normal results that have been seen by the patient already.   - telephone call: abnormal results or if patient has not viewed results in their echart.

## 2022-04-18 ENCOUNTER — Encounter: Payer: Self-pay | Admitting: Family Medicine

## 2022-04-18 ENCOUNTER — Ambulatory Visit: Payer: BC Managed Care – PPO | Admitting: Family Medicine

## 2022-04-18 VITALS — BP 128/80 | HR 82 | Temp 97.8°F | Ht 61.81 in | Wt 129.6 lb

## 2022-04-18 DIAGNOSIS — G479 Sleep disorder, unspecified: Secondary | ICD-10-CM

## 2022-04-18 DIAGNOSIS — E559 Vitamin D deficiency, unspecified: Secondary | ICD-10-CM | POA: Diagnosis not present

## 2022-04-18 DIAGNOSIS — E538 Deficiency of other specified B group vitamins: Secondary | ICD-10-CM

## 2022-04-18 DIAGNOSIS — Z7689 Persons encountering health services in other specified circumstances: Secondary | ICD-10-CM

## 2022-04-18 DIAGNOSIS — L309 Dermatitis, unspecified: Secondary | ICD-10-CM

## 2022-04-18 DIAGNOSIS — Z23 Encounter for immunization: Secondary | ICD-10-CM

## 2022-04-18 MED ORDER — TRAZODONE HCL 50 MG PO TABS: 25.0000 mg | ORAL_TABLET | Freq: Every evening | ORAL | 1 refills | Status: DC | PRN

## 2022-04-18 MED ORDER — VITAMIN D (ERGOCALCIFEROL) 1.25 MG (50000 UNIT) PO CAPS: 50000.0000 [IU] | ORAL_CAPSULE | ORAL | 0 refills | Status: DC

## 2022-04-19 ENCOUNTER — Ambulatory Visit: Payer: BC Managed Care – PPO

## 2022-05-16 ENCOUNTER — Ambulatory Visit (INDEPENDENT_AMBULATORY_CARE_PROVIDER_SITE_OTHER): Payer: BC Managed Care – PPO

## 2022-05-16 DIAGNOSIS — E538 Deficiency of other specified B group vitamins: Secondary | ICD-10-CM

## 2022-05-16 NOTE — Progress Notes (Cosign Needed Addendum)
Pt here today for mothly B12 shot. Shot was given with no issue. Rx provided by office.    

## 2022-06-16 ENCOUNTER — Ambulatory Visit (INDEPENDENT_AMBULATORY_CARE_PROVIDER_SITE_OTHER): Payer: BC Managed Care – PPO

## 2022-06-16 DIAGNOSIS — E538 Deficiency of other specified B group vitamins: Secondary | ICD-10-CM | POA: Diagnosis not present

## 2022-06-16 NOTE — Progress Notes (Signed)
Pt here today for mothly B12 shot. Shot was given with no issue. Rx provided by office.

## 2022-07-18 ENCOUNTER — Ambulatory Visit (INDEPENDENT_AMBULATORY_CARE_PROVIDER_SITE_OTHER): Payer: BC Managed Care – PPO

## 2022-07-18 DIAGNOSIS — E538 Deficiency of other specified B group vitamins: Secondary | ICD-10-CM

## 2022-07-18 NOTE — Progress Notes (Signed)
Pt here today for monthly B12 shot. Shot was given with no issue. Rx provided by office.

## 2022-08-15 ENCOUNTER — Ambulatory Visit (INDEPENDENT_AMBULATORY_CARE_PROVIDER_SITE_OTHER): Payer: BC Managed Care – PPO

## 2022-08-15 DIAGNOSIS — E538 Deficiency of other specified B group vitamins: Secondary | ICD-10-CM

## 2022-08-15 NOTE — Progress Notes (Signed)
Pt here for monthly B12 injection per Dr.Kuneff  B12 1064mcg given IM right deltoid, and pt tolerated injection well.  Next B12 injection scheduled for 1 month.

## 2022-09-15 DIAGNOSIS — Z01419 Encounter for gynecological examination (general) (routine) without abnormal findings: Secondary | ICD-10-CM | POA: Diagnosis not present

## 2022-09-15 DIAGNOSIS — Z1231 Encounter for screening mammogram for malignant neoplasm of breast: Secondary | ICD-10-CM | POA: Diagnosis not present

## 2022-09-15 DIAGNOSIS — Z124 Encounter for screening for malignant neoplasm of cervix: Secondary | ICD-10-CM | POA: Diagnosis not present

## 2022-09-15 LAB — HM PAP SMEAR
HM Pap smear: NORMAL
HPV, high-risk: NEGATIVE

## 2022-09-15 LAB — HM MAMMOGRAPHY

## 2022-09-15 LAB — RESULTS CONSOLE HPV
CHL HPV: NEGATIVE
CHL HPV: NEGATIVE

## 2022-09-19 ENCOUNTER — Ambulatory Visit (INDEPENDENT_AMBULATORY_CARE_PROVIDER_SITE_OTHER): Payer: BC Managed Care – PPO

## 2022-09-19 DIAGNOSIS — E538 Deficiency of other specified B group vitamins: Secondary | ICD-10-CM

## 2022-09-19 NOTE — Progress Notes (Signed)
Pt here for monthly B12 injection per    B12 1000mcg given IM, and pt tolerated injection well.   Next B12 injection scheduled for 1 month 

## 2022-10-03 ENCOUNTER — Ambulatory Visit (INDEPENDENT_AMBULATORY_CARE_PROVIDER_SITE_OTHER): Payer: BC Managed Care – PPO | Admitting: Family Medicine

## 2022-10-03 ENCOUNTER — Encounter: Payer: Self-pay | Admitting: Family Medicine

## 2022-10-03 VITALS — BP 126/88 | HR 93 | Temp 98.1°F | Ht 61.81 in | Wt 129.4 lb

## 2022-10-03 DIAGNOSIS — Z131 Encounter for screening for diabetes mellitus: Secondary | ICD-10-CM | POA: Diagnosis not present

## 2022-10-03 DIAGNOSIS — Z13220 Encounter for screening for lipoid disorders: Secondary | ICD-10-CM | POA: Diagnosis not present

## 2022-10-03 DIAGNOSIS — G479 Sleep disorder, unspecified: Secondary | ICD-10-CM | POA: Diagnosis not present

## 2022-10-03 DIAGNOSIS — E538 Deficiency of other specified B group vitamins: Secondary | ICD-10-CM

## 2022-10-03 DIAGNOSIS — E559 Vitamin D deficiency, unspecified: Secondary | ICD-10-CM | POA: Diagnosis not present

## 2022-10-03 DIAGNOSIS — Z0000 Encounter for general adult medical examination without abnormal findings: Secondary | ICD-10-CM

## 2022-10-03 DIAGNOSIS — Z1231 Encounter for screening mammogram for malignant neoplasm of breast: Secondary | ICD-10-CM

## 2022-10-03 LAB — CBC WITH DIFFERENTIAL/PLATELET
Basophils Absolute: 0.1 10*3/uL (ref 0.0–0.1)
Basophils Relative: 2.2 % (ref 0.0–3.0)
Eosinophils Absolute: 0.1 10*3/uL (ref 0.0–0.7)
Eosinophils Relative: 2 % (ref 0.0–5.0)
HCT: 39.2 % (ref 36.0–46.0)
Hemoglobin: 12.8 g/dL (ref 12.0–15.0)
Lymphocytes Relative: 35.9 % (ref 12.0–46.0)
Lymphs Abs: 1.7 10*3/uL (ref 0.7–4.0)
MCHC: 32.7 g/dL (ref 30.0–36.0)
MCV: 77.7 fl — ABNORMAL LOW (ref 78.0–100.0)
Monocytes Absolute: 0.5 10*3/uL (ref 0.1–1.0)
Monocytes Relative: 10.1 % (ref 3.0–12.0)
Neutro Abs: 2.3 10*3/uL (ref 1.4–7.7)
Neutrophils Relative %: 49.8 % (ref 43.0–77.0)
Platelets: 394 10*3/uL (ref 150.0–400.0)
RBC: 5.05 Mil/uL (ref 3.87–5.11)
RDW: 14.5 % (ref 11.5–15.5)
WBC: 4.7 10*3/uL (ref 4.0–10.5)

## 2022-10-03 LAB — COMPREHENSIVE METABOLIC PANEL
ALT: 9 U/L (ref 0–35)
AST: 14 U/L (ref 0–37)
Albumin: 3.9 g/dL (ref 3.5–5.2)
Alkaline Phosphatase: 53 U/L (ref 39–117)
BUN: 6 mg/dL (ref 6–23)
CO2: 24 mEq/L (ref 19–32)
Calcium: 8.9 mg/dL (ref 8.4–10.5)
Chloride: 102 mEq/L (ref 96–112)
Creatinine, Ser: 0.98 mg/dL (ref 0.40–1.20)
GFR: 71.71 mL/min (ref >60.00)
Glucose, Bld: 81 mg/dL (ref 70–99)
Potassium: 4 mEq/L (ref 3.5–5.1)
Sodium: 136 mEq/L (ref 135–145)
Total Bilirubin: 0.4 mg/dL (ref 0.2–1.2)
Total Protein: 6.6 g/dL (ref 6.0–8.3)

## 2022-10-03 LAB — LIPID PANEL
Cholesterol: 147 mg/dL (ref 0–200)
HDL: 59.8 mg/dL (ref >39.00)
LDL Cholesterol: 74 mg/dL (ref 0–99)
NonHDL: 87.24
Total CHOL/HDL Ratio: 2
Triglycerides: 65 mg/dL (ref 0.0–149.0)
VLDL: 13 mg/dL (ref 0.0–40.0)

## 2022-10-03 LAB — TSH: TSH: 1.85 u[IU]/mL (ref 0.35–5.50)

## 2022-10-03 LAB — VITAMIN B12: Vitamin B-12: 309 pg/mL (ref 211–911)

## 2022-10-03 LAB — HEMOGLOBIN A1C: Hgb A1c MFr Bld: 5.3 % (ref 4.6–6.5)

## 2022-10-03 LAB — VITAMIN D 25 HYDROXY (VIT D DEFICIENCY, FRACTURES): VITD: 34.88 ng/mL (ref 30.00–100.00)

## 2022-10-03 NOTE — Patient Instructions (Signed)
No follow-ups on file.        Great to see you today.  I have refilled the medication(s) we provide.   If labs were collected, we will inform you of lab results once received either by echart message or telephone call.   - echart message- for normal results that have been seen by the patient already.   - telephone call: abnormal results or if patient has not viewed results in their echart.  

## 2022-10-03 NOTE — Progress Notes (Signed)
Patient ID: Sophia Beard, female  DOB: January 23, 1981, 42 y.o.   MRN: [id number] Patient Care Team    Relationship Specialty Notifications Start End  Natalia Leatherwood, DO PCP - General Family Medicine  04/15/22   Olivia Mackie, MD Referring Physician Obstetrics and Gynecology  11/25/15   Napoleon Form, MD Consulting Physician Gastroenterology  04/15/22     Chief Complaint  Patient presents with   Annual Exam    [facility]; pt is fasting    Subjective: Sophia Beard is a 42 y.o.  female present for cpe and Chronic Conditions/illness Management  All past medical history, surgical history, allergies, family history, immunizations, medications and social history were updated in the electronic medical record today. All recent labs, ED visits and hospitalizations within the last year were reviewed. Health maintenance:  Colonoscopy:h/o chrons disease. Est w/ Dr. Lavon Paganini Mammogram:Completed at [facility]yn [facility] Cervical cancer screening:08/2021. Completed at gyn 2023- [facility] Immunizations: tdap- pt reports  a date of 2015.  Infectious disease screening: HIV and Hep C declined  B 12 deficiency: She is receiving B12 injections every 4 weeks for B12 deficiency secondary to her Crohn's disease.    Insomnia:Pt reports today she did not start trazodone and uses [place] from gyn as needed.  Prior note: Patient has been having difficulty sleeping.  Only sleeping about 4 hours a night.  Her gynecologist started her on Ambien 6.25 mg extended release.  She states she does feel the medication works well for her when she uses it.  However it can make her feel little groggy the next day.  She is interested in trying a medication that is not a controlled substance.  Vitamin D deficiency: Noted patient's vitamin D was 18 in January.  She is currently taking supplement a few times a week.       10/03/2022    8:33 AM 04/18/2022   10:04 AM  Depression screen PHQ 2/9  Decreased Interest 0  1  Down, Depressed, Hopeless 0 1  PHQ - 2 Score 0 2  Altered sleeping 2 2  Tired, decreased energy 1 1  Change in appetite 1 0  Feeling bad or failure about yourself  1 1  Trouble concentrating 0 0  Moving slowly or fidgety/restless 0 0  Suicidal thoughts 0 0  PHQ-9 Score 5 6      04/18/2022   10:04 AM  GAD 7 : Generalized Anxiety Score  Nervous, Anxious, on Edge 1  Control/stop worrying 1  Worry too much - different things 1  Trouble relaxing 1  Restless 1  Easily annoyed or irritable 1  Afraid - awful might happen 1  Total GAD 7 Score 7          10/03/2022    8:33 AM  Fall Risk   Falls in the past year? 0  Number falls in past yr: 0  Injury with Fall? 0  Follow up Falls evaluation completed   Immunization History  Administered Date(s) Administered   Influenza,inj,Quad PF,6+ Mos 03/03/2017, 04/18/2022   Influenza-Unspecified 05/02/2016, 03/21/2019   PFIZER(Purple Top)SARS-COV-2 Vaccination 08/16/2019, 09/18/2019   Pneumococcal Polysaccharide-23 02/09/2021    No results found.  Past Medical History:  Diagnosis Date   Anal fissure    Anemia    not presently   Anxiety    Asthma    exercise induced   COVID-19 11/10/2020   Crohn disease (HCC)    Ileocecetomy 2015   Depression  Family history of adverse reaction to anesthesia    mother / sister have nausea   GERD (gastroesophageal reflux disease)    GI bleed    Headache    Ovarian cyst    PONV (postoperative nausea and vomiting)    Protein S deficiency (HCC) 11/2015   Raynaud's disease    Allergies  Allergen Reactions   Iron Dextran Anaphylaxis   Sulfonamide Derivatives Hives   Past Surgical History:  Procedure Laterality Date   CHOLECYSTECTOMY N/A 10/13/2020   Procedure: LAPAROSCOPIC CHOLECYSTECTOMY WITH INTRAOPERATIVE CHOLANGIOGRAM;  Surgeon: Berna Bue, MD;  Location: [facility];  Service: General;  Laterality: N/A;   COLONOSCOPY  2022   DILATION AND EVACUATION N/A 06/17/2016    Procedure: DILATATION AND EVACUATION;  Surgeon: Olivia Mackie, MD;  Location: [facility];  Service: Gynecology;  Laterality: N/A;   ESOPHAGOGASTRODUODENOSCOPY ENDOSCOPY     ILEOCECETOMY  2015   Crohn s disease   LAPAROSCOPIC ILEOCECECTOMY N/A 04/21/2014   Procedure: LAPAROSCOPIC ASSISTED ILEOCYSTECTOMY;  Surgeon: Avel Peace, MD;  Location: [facility];  Service: General;  Laterality: N/A;   WISDOM TOOTH EXTRACTION     Family History  Problem Relation Age of Onset   Heart disease Mother    Depression Mother    Skin cancer Mother    Hypertension Mother    Hypertension Father    Skin cancer Father    Miscarriages / Stillbirths Sister    Depression Sister    Asthma Sister    Irritable bowel syndrome Sister    Heart disease Paternal Grandfather    Depression Daughter    Asthma Daughter    Asthma Daughter    Colon cancer Paternal Uncle    Esophageal cancer Neg Hx    Stomach cancer Neg Hx    Rectal cancer Neg Hx    Social History   Social History Narrative   Marital status/children/pets: Married, 2 children   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 If you have any questions, ask your nurse or doctor.          STOP taking these medications    cyanocobalamin 1000 MCG/ML injection Commonly known as: Dodex Stopped by: Felix Pacini, DO   mometasone 0.1 % cream Commonly known as: ELOCON Stopped by: Felix Pacini, DO       TAKE these medications    buPROPion 300 MG 24 hr tablet Commonly known as: WELLBUTRIN XL Take 300 mg by mouth in the morning.   traZODone 50 MG tablet Commonly known as: DESYREL Take 0.5-1.5 tablets (25-75 mg total) by mouth at bedtime as needed  for sleep.   Vitamin D (Ergocalciferol) 1.25 MG (50000 UNIT) Caps capsule Commonly known as: DRISDOL Take 1 capsule (50,000 Units total) by mouth every 7 (seven) days.   Vitamin D3 125 MCG (5000 UT) Chew Chew by mouth.        All past medical history, surgical history, allergies, family history, immunizations andmedications were updated in the EMR today and reviewed under the history and medication portions of their  EMR.    No results found for this or any previous visit (from the past 2160 hour(s)).   ROS 14 pt review of systems performed and negative (unless mentioned in an HPI)  Objective: BP 126/88   Pulse 93   Temp 98.1 F (36.7 C)   Ht 5' 1.81" (1.57 m)   Wt 129 lb 6.4 oz (58.7 kg)   LMP 09/27/2022   SpO2 99%   BMI 23.81 kg/m  Physical Exam Vitals and nursing note reviewed.  Constitutional:      General: She is not in acute distress.    Appearance: Normal appearance. She is not ill-appearing or toxic-appearing.  HENT:     Head: Normocephalic and atraumatic.     Right Ear: Tympanic membrane, ear canal and external ear normal. There is no impacted cerumen.     Left Ear: Tympanic membrane, ear canal and external ear normal. There is no impacted cerumen.     Nose: No congestion or rhinorrhea.     Mouth/Throat:     Mouth: Mucous membranes are moist.     Pharynx: Oropharynx is clear. No oropharyngeal exudate or posterior oropharyngeal erythema.  Eyes:     General: No scleral icterus.       Right eye: No discharge.        Left eye: No discharge.     Extraocular Movements: Extraocular movements intact.     Conjunctiva/sclera: Conjunctivae normal.     Pupils: Pupils are equal, round, and reactive to light.  Cardiovascular:     Rate and Rhythm: Normal rate and regular rhythm.     Pulses: Normal pulses.     Heart sounds: Normal heart sounds. No murmur heard.    No friction rub. No gallop.  Pulmonary:     Effort: Pulmonary effort is normal. No respiratory distress.      Breath sounds: Normal breath sounds. No stridor. No wheezing, rhonchi or rales.  Chest:     Chest wall: No tenderness.  Abdominal:     General: Abdomen is flat. Bowel sounds are normal. There is no distension.     Palpations: Abdomen is soft. There is no mass.     Tenderness: There is no abdominal tenderness. There is no right CVA tenderness, left CVA tenderness, guarding or rebound.     Hernia: No hernia is present.  Musculoskeletal:        General: No swelling, tenderness or deformity. Normal range of motion.     Cervical back: Normal range of motion and neck supple. No rigidity or tenderness.     Right lower leg: No edema.     Left lower leg: No edema.  Lymphadenopathy:     Cervical: No cervical adenopathy.  Skin:    General: Skin is warm and dry.     Coloration: Skin is not jaundiced or pale.     Findings: No bruising, erythema, lesion or rash.  Neurological:     General: No focal deficit present.     Mental Status: She is alert and oriented to person, place, and time. Mental status is at baseline.     Cranial Nerves: No cranial nerve deficit.     Sensory: No sensory deficit.     Motor: No weakness.     Coordination: Coordination normal.     Gait: Gait normal.     Deep Tendon Reflexes: Reflexes normal.  Psychiatric:        Mood and Affect: Mood normal.        Behavior:  Behavior normal.        Thought Content: Thought content normal.        Judgment: Judgment normal.       Assessment/plan: GENEIVE KAWAHARA is a 42 y.o. female present for cpe and Chronic Conditions/illness Management Vit d: Vit d levels collected today Completed 50K weekly 12 weeks, started 5000 mcg a few times a week last 12 weeks after completing script.   Vitamin B12 deficiency Continue B12 injections every 4 weeks by nurse visit x12.  Patient will follow yearly for her physicals. - Vitamin B12  Sleep disturbance Medication managed by gyn - TSH  Routine general medical examination at a health  care facility Patient was encouraged to exercise greater than 150 minutes a week. Patient was encouraged to choose a diet filled with fresh fruits and vegetables, and lean meats. AVS provided to patient today for education/recommendation on gender specific health and safety maintenance. - CBC with Differential/Platelet - Comprehensive metabolic panel - Hemoglobin A1c - Lipid panel Colonoscopy:h/o chrons disease. Est w/ Dr. Lavon Paganini Mammogram:[facility] at [facility] Cervical cancer screening:08/2021. Completed at gyn 2023- [place] Immunizations: tdap- pt reports  a date of 2015.  Infectious disease screening: HIV and Hep C declined  Lipid screening - Lipid panel Diabetes mellitus screening - Hemoglobin A1c Return in about 1 year (around 10/04/2023) for cpe (20 min), Routine chronic condition follow-up.  Orders Placed This Encounter  Procedures   Vitamin B12   CBC with Differential/Platelet   Comprehensive metabolic panel   Hemoglobin A1c   Lipid panel   TSH   VITAMIN D 25 Hydroxy (Vit-D Deficiency, Fractures)   No orders of the defined types were placed in this encounter.  Referral Orders  No referral(s) requested today     Note is dictated utilizing voice recognition software. Although note has been proof read prior to signing, occasional typographical errors still can be missed. If any questions arise, please do not hesitate to call for verification.  Electronically signed by: Felix Pacini, DO [facility]

## 2022-10-19 ENCOUNTER — Ambulatory Visit (INDEPENDENT_AMBULATORY_CARE_PROVIDER_SITE_OTHER): Payer: BC Managed Care – PPO

## 2022-10-19 DIAGNOSIS — E538 Deficiency of other specified B group vitamins: Secondary | ICD-10-CM

## 2022-10-19 MED ORDER — CYANOCOBALAMIN 1000 MCG/ML IJ SOLN
1000.0000 ug | Freq: Once | INTRAMUSCULAR | Status: AC
Start: 2022-10-19 — End: 2022-10-19
  Administered 2022-10-19: 1000 ug via INTRAMUSCULAR

## 2022-10-19 NOTE — Progress Notes (Signed)
Pt here for monthly B12 injection.  B12 given IM, and pt tolerated injection well.  Next B12 injection scheduled for 4 weeks from today.

## 2022-11-21 ENCOUNTER — Ambulatory Visit (INDEPENDENT_AMBULATORY_CARE_PROVIDER_SITE_OTHER): Payer: BC Managed Care – PPO

## 2022-11-21 VITALS — BP 126/88

## 2022-11-21 DIAGNOSIS — E538 Deficiency of other specified B group vitamins: Secondary | ICD-10-CM

## 2022-11-21 MED ORDER — CYANOCOBALAMIN 1000 MCG/ML IJ SOLN
1000.0000 ug | Freq: Once | INTRAMUSCULAR | Status: AC
Start: 2022-11-21 — End: 2022-11-21
  Administered 2022-11-21: 1000 ug via INTRAMUSCULAR

## 2022-11-24 NOTE — Progress Notes (Addendum)
Pt here for monthly B12 injection.   B12 given IM, and pt tolerated injection well.   Next B12 injection scheduled.

## 2022-12-19 ENCOUNTER — Ambulatory Visit (INDEPENDENT_AMBULATORY_CARE_PROVIDER_SITE_OTHER): Payer: BC Managed Care – PPO

## 2022-12-19 DIAGNOSIS — E538 Deficiency of other specified B group vitamins: Secondary | ICD-10-CM

## 2022-12-19 MED ORDER — CYANOCOBALAMIN 1000 MCG/ML IJ SOLN
1000.0000 ug | INTRAMUSCULAR | Status: DC
Start: 2022-12-19 — End: 2024-04-02
  Administered 2022-12-19 – 2024-02-02 (×9): 1000 ug via INTRAMUSCULAR

## 2022-12-19 NOTE — Progress Notes (Signed)
Pt here for monthly B12 injection per Dr Kuneff ? ?B12 1000mcg given IM, and pt tolerated injection well. ? ?Next B12 injection scheduled for 2 weeks ? ?

## 2022-12-28 ENCOUNTER — Encounter (INDEPENDENT_AMBULATORY_CARE_PROVIDER_SITE_OTHER): Payer: Self-pay

## 2023-01-16 ENCOUNTER — Ambulatory Visit: Payer: BC Managed Care – PPO

## 2023-01-23 ENCOUNTER — Ambulatory Visit (INDEPENDENT_AMBULATORY_CARE_PROVIDER_SITE_OTHER): Payer: BC Managed Care – PPO

## 2023-01-23 DIAGNOSIS — E538 Deficiency of other specified B group vitamins: Secondary | ICD-10-CM | POA: Diagnosis not present

## 2023-01-23 MED ORDER — CYANOCOBALAMIN 1000 MCG/ML IJ SOLN
1000.0000 ug | Freq: Once | INTRAMUSCULAR | Status: AC
Start: 2023-01-23 — End: 2023-01-23
  Administered 2023-01-23: 1000 ug via INTRAMUSCULAR

## 2023-01-23 NOTE — Progress Notes (Signed)
Pt here for monthly B12 injection per Dr Claiborne Billings   B12 given IM, and pt tolerated injection well.  Please sign in absence of PCP.

## 2023-02-20 ENCOUNTER — Ambulatory Visit (INDEPENDENT_AMBULATORY_CARE_PROVIDER_SITE_OTHER): Payer: BC Managed Care – PPO

## 2023-02-20 DIAGNOSIS — E538 Deficiency of other specified B group vitamins: Secondary | ICD-10-CM

## 2023-02-20 MED ORDER — CYANOCOBALAMIN 1000 MCG/ML IJ SOLN
1000.0000 ug | Freq: Once | INTRAMUSCULAR | Status: AC
Start: 2023-02-20 — End: 2023-02-20
  Administered 2023-02-20: 1000 ug via INTRAMUSCULAR

## 2023-02-20 NOTE — Progress Notes (Signed)
Pt here for monthly B12 injection per Dr Claiborne Billings   B12 given IM, and pt tolerated injection well.

## 2023-02-22 DIAGNOSIS — Z1329 Encounter for screening for other suspected endocrine disorder: Secondary | ICD-10-CM | POA: Diagnosis not present

## 2023-02-22 DIAGNOSIS — N6452 Nipple discharge: Secondary | ICD-10-CM | POA: Diagnosis not present

## 2023-02-22 DIAGNOSIS — F5104 Psychophysiologic insomnia: Secondary | ICD-10-CM | POA: Diagnosis not present

## 2023-03-20 ENCOUNTER — Other Ambulatory Visit (INDEPENDENT_AMBULATORY_CARE_PROVIDER_SITE_OTHER): Payer: BC Managed Care – PPO

## 2023-03-20 DIAGNOSIS — E538 Deficiency of other specified B group vitamins: Secondary | ICD-10-CM | POA: Diagnosis not present

## 2023-03-20 DIAGNOSIS — Z23 Encounter for immunization: Secondary | ICD-10-CM

## 2023-03-20 NOTE — Progress Notes (Signed)
Pt here for monthly B12 injection per Dr Claiborne Billings   B12 given IM, and pt tolerated injection well.  Please sign in absence of PCP.

## 2023-04-20 ENCOUNTER — Ambulatory Visit: Payer: BC Managed Care – PPO

## 2023-04-21 ENCOUNTER — Ambulatory Visit (INDEPENDENT_AMBULATORY_CARE_PROVIDER_SITE_OTHER): Payer: BC Managed Care – PPO

## 2023-04-21 DIAGNOSIS — E538 Deficiency of other specified B group vitamins: Secondary | ICD-10-CM

## 2023-04-21 NOTE — Progress Notes (Signed)
Pt here for monthly B12 injection per Dr Claiborne Billings  B12 given IM, and pt tolerated injection well.  Next B12 injection scheduled for 1 month

## 2023-05-22 ENCOUNTER — Ambulatory Visit (INDEPENDENT_AMBULATORY_CARE_PROVIDER_SITE_OTHER): Payer: BC Managed Care – PPO

## 2023-05-22 DIAGNOSIS — E538 Deficiency of other specified B group vitamins: Secondary | ICD-10-CM

## 2023-05-22 NOTE — Progress Notes (Signed)
Pt here for monthly B12 injection per Dr.Kuneff   B12 1000mcg given IM, and pt tolerated injection well.   Next B12 injection scheduled for 1 month.       

## 2023-06-22 ENCOUNTER — Ambulatory Visit: Payer: BC Managed Care – PPO

## 2023-06-22 NOTE — Progress Notes (Signed)
Sophia Beard , 1980-09-03, 43 y.o., female MRN: [id number] Patient Care Team    Relationship Specialty Notifications Start End  Natalia Leatherwood, DO PCP - General Family Medicine  04/15/22   Olivia Mackie, MD Referring Physician Obstetrics and Gynecology  11/25/15   Napoleon Form, MD Consulting Physician Gastroenterology  04/15/22    And yesterday accident yesterday and that was Chief Complaint  Patient presents with   Shoulder Pain    Left shoulder into neck start Monday night      Subjective: Sophia Beard is a 43 y.o. Pt presents for an OV with complaints of neck and upper shoulder pain of 4 days duration. She denies numbness or tingling radiating down left arm. She works on a computer daily and reads her kindle at night. She has had these symptoms in the past, and has taken robaxin to help.  Pt has tried ice, heat, NSAIDs to ease their symptoms.  She is not getting much relief and can continue taking NSAIDs safely with her history of Crohn's.     06/23/2023   10:16 AM 10/03/2022    8:33 AM 04/18/2022   10:04 AM  Depression screen PHQ 2/9  Decreased Interest 1 0 1  Down, Depressed, Hopeless 1 0 1  PHQ - 2 Score 2 0 2  Altered sleeping 2 2 2   Tired, decreased energy 2 1 1   Change in appetite 1 1 0  Feeling bad or failure about yourself  1 1 1   Trouble concentrating 1 0 0  Moving slowly or fidgety/restless 0 0 0  Suicidal thoughts 0 0 0  PHQ-9 Score 9 5 6   Difficult doing work/chores Not difficult at all      Allergies  Allergen Reactions   Iron Dextran Anaphylaxis   Sulfonamide Derivatives Hives   Social History   Social History Narrative   Marital status/children/pets: Married, 2 children   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, Nature conservation officer:      -smoke alarm in the home:Yes     - wears seatbelt: Yes     - Feels safe in their relationships: Yes      Past Medical History:  Diagnosis Date   Anal fissure    Anemia    not presently   Anxiety     Asthma    exercise induced   COVID-19 11/10/2020   Crohn disease (HCC)    Ileocecetomy 2015   Depression    Family history of adverse reaction to anesthesia    mother / sister have nausea   GERD (gastroesophageal reflux disease)    GI bleed    Headache    Ovarian cyst    PONV (postoperative nausea and vomiting)    Protein S deficiency (HCC) 11/2015   Raynaud's disease    Past Surgical History:  Procedure Laterality Date   CHOLECYSTECTOMY N/A 10/13/2020   Procedure: LAPAROSCOPIC CHOLECYSTECTOMY WITH INTRAOPERATIVE CHOLANGIOGRAM;  Surgeon: Berna Bue, MD;  Location: [facility];  Service: General;  Laterality: N/A;   COLONOSCOPY  2022   DILATION AND EVACUATION N/A 06/17/2016   Procedure: DILATATION AND EVACUATION;  Surgeon: Olivia Mackie, MD;  Location: [facility];  Service: Gynecology;  Laterality: N/A;   ESOPHAGOGASTRODUODENOSCOPY ENDOSCOPY     ILEOCECETOMY  2015   Crohn s disease   LAPAROSCOPIC ILEOCECECTOMY N/A 04/21/2014   Procedure: LAPAROSCOPIC ASSISTED ILEOCYSTECTOMY;  Surgeon: Avel Peace, MD;  Location: [facility];  Service: General;  Laterality: N/A;   WISDOM TOOTH  EXTRACTION     Family History  Problem Relation Age of Onset   Heart disease Mother    Depression Mother    Skin cancer Mother    Hypertension Mother    Hypertension Father    Skin cancer Father    Miscarriages / Stillbirths Sister    Depression Sister    Asthma Sister    Irritable bowel syndrome Sister    Heart disease Paternal Grandfather    Depression Daughter    Asthma Daughter    Asthma Daughter    Colon cancer Paternal Uncle    Esophageal cancer Neg Hx    Stomach cancer Neg Hx    Rectal cancer Neg Hx    Allergies as of 06/23/2023       Reactions   Iron Dextran Anaphylaxis   Sulfonamide Derivatives Hives        Medication List        Accurate as of June 23, 2023 10:18 AM. If you have any questions, ask your nurse or doctor.          buPROPion 300 MG 24 hr  tablet Commonly known as: WELLBUTRIN XL Take 300 mg by mouth in the morning.   traZODone 50 MG tablet Commonly known as: DESYREL Take 0.5-1.5 tablets (25-75 mg total) by mouth at bedtime as needed for sleep.   Vitamin D (Ergocalciferol) 1.25 MG (50000 UNIT) Caps capsule Commonly known as: DRISDOL Take 1 capsule (50,000 Units total) by mouth every 7 (seven) days.   Vitamin D3 125 MCG (5000 UT) Chew Chew by mouth.        All past medical history, surgical history, allergies, family history, immunizations andmedications were updated in the EMR today and reviewed under the history and medication portions of their EMR.     ROS Negative, with the exception of above mentioned in HPI   Objective:  BP 120/70   Pulse 94   Temp 97.9 F (36.6 C)   Wt 132 lb 3.2 oz (60 kg)   LMP 06/11/2023   SpO2 99%   BMI 24.33 kg/m  Body mass index is 24.33 kg/m.  Physical Exam Vitals and nursing note reviewed.  Constitutional:      General: She is not in acute distress.    Appearance: Normal appearance. She is normal weight. She is not ill-appearing or toxic-appearing.  HENT:     Head: Normocephalic and atraumatic.  Eyes:     General: No scleral icterus.       Right eye: No discharge.        Left eye: No discharge.     Extraocular Movements: Extraocular movements intact.     Conjunctiva/sclera: Conjunctivae normal.     Pupils: Pupils are equal, round, and reactive to light.  Musculoskeletal:        General: Tenderness present. No swelling or deformity. Normal range of motion.     Cervical back: Spasms and tenderness present. No edema, torticollis, bony tenderness or crepitus. No pain with movement. Normal range of motion.  Skin:    Findings: No rash.  Neurological:     Mental Status: She is alert and oriented to person, place, and time. Mental status is at baseline.     Motor: No weakness.     Coordination: Coordination normal.     Gait: Gait normal.  Psychiatric:        Mood and  Affect: Mood normal.        Behavior: Behavior normal.  Thought Content: Thought content normal.        Judgment: Judgment normal.      No results found. No results found. No results found for this or any previous visit (from the past 24 hours).  Assessment/Plan: [name] is a 43 y.o. female present for OV for  Neck strain, initial encounter (Primary) Encouraged neck stretches BID Heat application BID, with burn precaution discussed Robaxin BID prn- has responded well to this in the past F/u PRN  B12 deficiency B2 inj provided today  Reviewed expectations re: course of current medical issues. Discussed self-management of symptoms. Outlined signs and symptoms indicating need for more acute intervention. Patient verbalized understanding and all questions were answered. Patient received an After-Visit Summary.    No orders of the defined types were placed in this encounter.  No orders of the defined types were placed in this encounter.  Referral Orders  No referral(s) requested today     Note is dictated utilizing voice recognition software. Although note has been proof read prior to signing, occasional typographical errors still can be missed. If any questions arise, please do not hesitate to call for verification.   electronically signed by:  Felix Pacini, DO   [facility] - [place]

## 2023-06-23 ENCOUNTER — Ambulatory Visit: Payer: BC Managed Care – PPO | Admitting: Family Medicine

## 2023-06-23 ENCOUNTER — Ambulatory Visit: Payer: BC Managed Care – PPO

## 2023-06-23 ENCOUNTER — Encounter: Payer: Self-pay | Admitting: Family Medicine

## 2023-06-23 VITALS — BP 120/70 | HR 94 | Temp 97.9°F | Wt 132.2 lb

## 2023-06-23 DIAGNOSIS — E538 Deficiency of other specified B group vitamins: Secondary | ICD-10-CM

## 2023-06-23 DIAGNOSIS — S161XXA Strain of muscle, fascia and tendon at neck level, initial encounter: Secondary | ICD-10-CM | POA: Diagnosis not present

## 2023-06-23 MED ORDER — METHOCARBAMOL 500 MG PO TABS: 500.0000 mg | ORAL_TABLET | Freq: Two times a day (BID) | ORAL | 1 refills | Status: DC | PRN

## 2023-06-23 NOTE — Patient Instructions (Addendum)
Return in about 2 weeks (around 07/07/2023), or if symptoms worsen or fail to improve.        Great to see you today.  I have refilled the medication(s) we provide.   If labs were collected or images ordered, we will inform you of  results once we have received them and reviewed. We will contact you either by echart message, or telephone call.  Please give ample time to the testing facility, and our office to run,  receive and review results. Please do not call inquiring of results, even if you can see them in your chart. We will contact you as soon as we are able. If it has been over 1 week since the test was completed, and you have not yet heard from [place], then please call us.    - echart message- for normal results that have been seen by the patient already.   - telephone call: abnormal results or if patient has not viewed results in their echart.  If a referral to a specialist was entered for you, please call us in 2 weeks if you have not heard from the specialist office to schedule.

## 2023-07-21 ENCOUNTER — Ambulatory Visit: Payer: BC Managed Care – PPO

## 2023-07-28 ENCOUNTER — Ambulatory Visit (INDEPENDENT_AMBULATORY_CARE_PROVIDER_SITE_OTHER): Payer: BC Managed Care – PPO

## 2023-07-28 DIAGNOSIS — E538 Deficiency of other specified B group vitamins: Secondary | ICD-10-CM | POA: Diagnosis not present

## 2023-07-28 MED ORDER — CYANOCOBALAMIN 1000 MCG/ML IJ SOLN
1000.0000 ug | Freq: Once | INTRAMUSCULAR | Status: AC
Start: 2023-07-28 — End: 2023-07-28
  Administered 2023-07-28: 1000 ug via INTRAMUSCULAR

## 2023-07-28 NOTE — Progress Notes (Signed)
Pt here for monthly B12 injection per Dr.Kuneff   B12 1000mcg given IM, and pt tolerated injection well.   Next B12 injection scheduled for 1 month.       

## 2023-08-25 ENCOUNTER — Ambulatory Visit: Payer: BC Managed Care – PPO

## 2023-08-25 DIAGNOSIS — E538 Deficiency of other specified B group vitamins: Secondary | ICD-10-CM

## 2023-08-25 NOTE — Progress Notes (Signed)
Pt here for monthly B12 injection per Dr.Kuneff   B12 1000mcg given IM left deltoid , and pt tolerated injection well.   Next B12 injection scheduled for 1 month.     

## 2023-09-18 DIAGNOSIS — Z1231 Encounter for screening mammogram for malignant neoplasm of breast: Secondary | ICD-10-CM | POA: Diagnosis not present

## 2023-09-18 LAB — HM MAMMOGRAPHY

## 2023-09-22 ENCOUNTER — Ambulatory Visit

## 2023-10-04 ENCOUNTER — Ambulatory Visit: Payer: BC Managed Care – PPO | Admitting: Family Medicine

## 2023-10-04 ENCOUNTER — Encounter: Payer: Self-pay | Admitting: Family Medicine

## 2023-10-04 VITALS — BP 122/76 | HR 81 | Temp 97.8°F | Ht 62.0 in | Wt 130.8 lb

## 2023-10-04 DIAGNOSIS — Z131 Encounter for screening for diabetes mellitus: Secondary | ICD-10-CM | POA: Diagnosis not present

## 2023-10-04 DIAGNOSIS — Z79899 Other long term (current) drug therapy: Secondary | ICD-10-CM

## 2023-10-04 DIAGNOSIS — Z13220 Encounter for screening for lipoid disorders: Secondary | ICD-10-CM | POA: Diagnosis not present

## 2023-10-04 DIAGNOSIS — Z1589 Genetic susceptibility to other disease: Secondary | ICD-10-CM

## 2023-10-04 DIAGNOSIS — K5000 Crohn's disease of small intestine without complications: Secondary | ICD-10-CM

## 2023-10-04 DIAGNOSIS — E559 Vitamin D deficiency, unspecified: Secondary | ICD-10-CM | POA: Diagnosis not present

## 2023-10-04 DIAGNOSIS — E611 Iron deficiency: Secondary | ICD-10-CM

## 2023-10-04 DIAGNOSIS — M542 Cervicalgia: Secondary | ICD-10-CM | POA: Insufficient documentation

## 2023-10-04 DIAGNOSIS — Z23 Encounter for immunization: Secondary | ICD-10-CM | POA: Diagnosis not present

## 2023-10-04 DIAGNOSIS — E538 Deficiency of other specified B group vitamins: Secondary | ICD-10-CM

## 2023-10-04 DIAGNOSIS — Z0000 Encounter for general adult medical examination without abnormal findings: Secondary | ICD-10-CM | POA: Diagnosis not present

## 2023-10-04 LAB — CBC
HCT: 41.3 % (ref 36.0–46.0)
Hemoglobin: 13.4 g/dL (ref 12.0–15.0)
MCHC: 32.5 g/dL (ref 30.0–36.0)
MCV: 78.4 fl (ref 78.0–100.0)
Platelets: 342 10*3/uL (ref 150.0–400.0)
RBC: 5.26 Mil/uL — ABNORMAL HIGH (ref 3.87–5.11)
RDW: 16.6 % — ABNORMAL HIGH (ref 11.5–15.5)
WBC: 8 10*3/uL (ref 4.0–10.5)

## 2023-10-04 LAB — COMPREHENSIVE METABOLIC PANEL WITH GFR
ALT: 6 U/L (ref 0–35)
AST: 12 U/L (ref 0–37)
Albumin: 4.3 g/dL (ref 3.5–5.2)
Alkaline Phosphatase: 59 U/L (ref 39–117)
BUN: 7 mg/dL (ref 6–23)
CO2: 26 meq/L (ref 19–32)
Calcium: 9.1 mg/dL (ref 8.4–10.5)
Chloride: 103 meq/L (ref 96–112)
Creatinine, Ser: 0.9 mg/dL (ref 0.40–1.20)
GFR: 78.87 mL/min (ref >60.00)
Glucose, Bld: 78 mg/dL (ref 70–99)
Potassium: 3.9 meq/L (ref 3.5–5.1)
Sodium: 135 meq/L (ref 135–145)
Total Bilirubin: 0.4 mg/dL (ref 0.2–1.2)
Total Protein: 7 g/dL (ref 6.0–8.3)

## 2023-10-04 LAB — VITAMIN D 25 HYDROXY (VIT D DEFICIENCY, FRACTURES): VITD: 28.72 ng/mL — ABNORMAL LOW (ref 30.00–100.00)

## 2023-10-04 LAB — IBC + FERRITIN
Ferritin: 12.6 ng/mL (ref 10.0–291.0)
Iron: 83 ug/dL (ref 42–145)
Saturation Ratios: 18.1 % — ABNORMAL LOW (ref 20.0–50.0)
TIBC: 459.2 ug/dL — ABNORMAL HIGH (ref 250.0–450.0)
Transferrin: 328 mg/dL (ref 212.0–360.0)

## 2023-10-04 LAB — LIPID PANEL
Cholesterol: 145 mg/dL (ref 0–200)
HDL: 58.1 mg/dL (ref >39.00)
LDL Cholesterol: 72 mg/dL (ref 0–99)
NonHDL: 87.06
Total CHOL/HDL Ratio: 2
Triglycerides: 77 mg/dL (ref 0.0–149.0)
VLDL: 15.4 mg/dL (ref 0.0–40.0)

## 2023-10-04 LAB — HEMOGLOBIN A1C: Hgb A1c MFr Bld: 5.1 % (ref 4.6–6.5)

## 2023-10-04 LAB — B12 AND FOLATE PANEL
Folate: 14.1 ng/mL (ref >5.9)
Vitamin B-12: 239 pg/mL (ref 211–911)

## 2023-10-04 LAB — TSH: TSH: 1.97 u[IU]/mL (ref 0.35–5.50)

## 2023-10-04 MED ORDER — CYANOCOBALAMIN 1000 MCG/ML IJ SOLN
1000.0000 ug | Freq: Once | INTRAMUSCULAR | Status: AC
  Administered 2023-10-04: 1000 ug via INTRAMUSCULAR

## 2023-10-04 MED ORDER — METHOCARBAMOL 500 MG PO TABS: 500.0000 mg | ORAL_TABLET | Freq: Two times a day (BID) | ORAL | 1 refills | Status: AC | PRN

## 2023-10-04 NOTE — Progress Notes (Signed)
 Patient ID: Sophia Beard, female  DOB: 10-19-1980, 43 y.o.   MRN: [id number] Patient Care Team    Relationship Specialty Notifications Start End  Mariel Shope, DO PCP - General Family Medicine  04/15/22   Meriam Stamp, MD Referring Physician Obstetrics and Gynecology  11/25/15   Sergio Dandy, MD Consulting Physician Gastroenterology  04/15/22     Chief Complaint  Patient presents with   Annual Exam    Pt is fasting.     Subjective: Sophia Beard is a 43 y.o.  female present for cpe and Chronic Conditions/illness Management  All past medical history, surgical history, allergies, family history, immunizations, medications and social history were updated in the electronic medical record today. All recent labs, ED visits and hospitalizations within the last year were reviewed.  Health maintenance:  Colonoscopy:h/o chrons disease. Est w/ Dr. Hilton Lucky Mammogram:Completed at gyn - [facility]/ / Cervical cancer screening:4/. Com[facility] Immunizations: tdap-due> completed 10/04/2023, influenza vaccine UTD 2024, recommend Shingrix vaccines after 50. Infectious disease screening: HIV and Hep C declined  B 12 deficiency: She is receiving B12 injections every 4 weeks for B12 deficiency secondary to her Crohn's disease.    Insomnia:Pt reports today she did not start trazodone  and uses the ambien from gyn as needed.  Prior note: Patient has been having difficulty sleeping.  Only sleeping about 4 hours a night.  Her gynecologist started her on Ambien 6.25 mg extended release.  She states she does feel the medication works well for her when she uses it.  However it can make her feel little groggy the next day.  She is interested in trying a medication that is not a controlled substance.  Vitamin D  deficiency: Vitamin D  supplement compliant       10/04/2023    8:38 AM 06/23/2023   10:16 AM 10/03/2022    8:33 AM 04/18/2022   10:04 AM  Depression screen PHQ 2/9   Decreased Interest 0 1 0 1  Down, Depressed, Hopeless 1 1 0 1  PHQ - 2 Score 1 2 0 2  Altered sleeping 2 2 2 2   Tired, decreased energy 1 2 1 1   Change in appetite 0 1 1 0  Feeling bad or failure about yourself  1 1 1 1   Trouble concentrating 0 1 0 0  Moving slowly or fidgety/restless 0 0 0 0  Suicidal thoughts 0 0 0 0  PHQ-9 Score 5 9 5 6   Difficult doing work/chores Somewhat difficult Not difficult at all        10/04/2023    8:38 AM 06/23/2023   10:16 AM 04/18/2022   10:04 AM  GAD 7 : Generalized Anxiety Score  Nervous, Anxious, on Edge 1 1 1   Control/stop worrying 1 1 1   Worry too much - different things 1 1 1   Trouble relaxing 2 1 1   Restless 0 1 1  Easily annoyed or irritable 1 1 1   Afraid - awful might happen 1 1 1   Total GAD 7 Score 7 7 7   Anxiety Difficulty Somewhat difficult Not difficult at all           10/04/2023    8:38 AM 06/23/2023   10:16 AM 10/03/2022    8:33 AM  Fall Risk   Falls in the past year? 0 0 0  Number falls in past yr:  0 0  Injury with Fall?  0 0  Risk for fall due to :  No Fall Risks   Follow up Falls evaluation completed Falls evaluation completed Falls evaluation completed   Immunization History  Administered Date(s) Administered   Influenza, Seasonal, Injecte, Preservative Fre 03/20/2023   Influenza,inj,Quad PF,6+ Mos 03/03/2017, 04/18/2022   Influenza-Unspecified 05/02/2016, 03/21/2019   PFIZER(Purple Top)SARS-COV-2 Vaccination 08/16/2019, 09/18/2019   Pneumococcal Polysaccharide-23 02/09/2021   Tdap 05/30/2013, 10/04/2023    No results found.  Past Medical History:  Diagnosis Date   Anal fissure    Anemia    not presently   Anxiety    Asthma    exercise induced   COVID-19 11/10/2020   Crohn disease (HCC)    Ileocecetomy 2015   Depression    Family history of adverse reaction to anesthesia    mother / sister have nausea   GERD (gastroesophageal reflux disease)    GI bleed    Headache    Ovarian cyst    PONV  (postoperative nausea and vomiting)    Protein S deficiency (HCC) 11/2015   Raynaud's disease    Allergies  Allergen Reactions   Iron Dextran Anaphylaxis   Sulfonamide Derivatives Hives   Past Surgical History:  Procedure Laterality Date   CHOLECYSTECTOMY N/A 10/13/2020   Procedure: LAPAROSCOPIC CHOLECYSTECTOMY WITH INTRAOPERATIVE CHOLANGIOGRAM;  Surgeon: Adalberto Acton, MD;  Location: [facility];  Service: General;  Laterality: N/A;   COLONOSCOPY  2022   DILATION AND EVACUATION N/A 06/17/2016   Procedure: DILATATION AND EVACUATION;  Surgeon: Meriam Stamp, MD;  Location: [facility];  Service: Gynecology;  Laterality: N/A;   ESOPHAGOGASTRODUODENOSCOPY ENDOSCOPY     ILEOCECETOMY  2015   Crohn s disease   LAPAROSCOPIC ILEOCECECTOMY N/A 04/21/2014   Procedure: LAPAROSCOPIC ASSISTED ILEOCYSTECTOMY;  Surgeon: Adalberto Hollow, MD;  Location: [facility];  Service: General;  Laterality: N/A;   WISDOM TOOTH EXTRACTION     Family History  Problem Relation Age of Onset   Heart disease Mother    Depression Mother    Skin cancer Mother    Hypertension Mother    Hypertension Father    Skin cancer Father    Miscarriages / Stillbirths Sister    Depression Sister    Asthma Sister    Irritable bowel syndrome Sister    Heart disease Paternal Grandfather    Depression Daughter    Asthma Daughter    Asthma Daughter    Colon cancer Paternal Uncle    Esophageal cancer Neg Hx    Stomach cancer Neg Hx    Rectal cancer Neg Hx    Social History   Social History Narrative   Marital status/children/pets: Married, 2 children   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 If you have any questions, ask  your nurse or doctor.          STOP taking these medications    traZODone  50 MG tablet Commonly known as: DESYREL  Stopped by: Napolean Backbone   Vitamin D  (Ergocalciferol ) 1.25 MG (50000 UNIT) Caps capsule Commonly known as: DRISDOL  Stopped by: Napolean Backbone  TAKE these medications    Ambien CR 6.25 MG CR tablet Generic drug: zolpidem   buPROPion  300 MG 24 hr tablet Commonly known as: WELLBUTRIN  XL Take 300 mg by mouth in the morning.   methocarbamol  500 MG tablet Commonly known as: ROBAXIN  Take 1 tablet (500 mg total) by mouth 2 (two) times daily as needed for muscle spasms.   Vitamin D3 125 MCG (5000 UT) Chew Chew by mouth.        All past medical history, surgical history, allergies, family history, immunizations andmedications were updated in the EMR today and reviewed under the history and medication portions of their EMR.    No results found for this or any previous visit (from the past 2160 hours).   ROS 14 pt review of systems performed and negative (unless mentioned in an HPI)  Objective: BP 122/76   Pulse 81   Temp 97.8 F (36.6 C)   Ht 5\' 2"  (1.575 m)   Wt 130 lb 12.8 oz (59.3 kg)   LMP 10/03/2023 (Exact Date)   SpO2 94%   BMI 23.92 kg/m  Physical Exam Vitals and nursing note reviewed.  Constitutional:      General: She is not in acute distress.    Appearance: Normal appearance. She is not ill-appearing or toxic-appearing.  HENT:     Head: Normocephalic and atraumatic.     Right Ear: Tympanic membrane, ear canal and external ear normal. There is no impacted cerumen.     Left Ear: Tympanic membrane, ear canal and external ear normal. There is no impacted cerumen.     Nose: No congestion or rhinorrhea.     Mouth/Throat:     Mouth: Mucous membranes are moist.     Pharynx: Oropharynx is clear. No oropharyngeal exudate or posterior oropharyngeal erythema.  Eyes:     General: No scleral icterus.       Right eye: No discharge.        Left  eye: No discharge.     Extraocular Movements: Extraocular movements intact.     Conjunctiva/sclera: Conjunctivae normal.     Pupils: Pupils are equal, round, and reactive to light.  Cardiovascular:     Rate and Rhythm: Normal rate and regular rhythm.     Pulses: Normal pulses.     Heart sounds: Normal heart sounds. No murmur heard.    No friction rub. No gallop.  Pulmonary:     Effort: Pulmonary effort is normal. No respiratory distress.     Breath sounds: Normal breath sounds. No stridor. No wheezing, rhonchi or rales.  Chest:     Chest wall: No tenderness.  Abdominal:     General: Abdomen is flat. Bowel sounds are normal. There is no distension.     Palpations: Abdomen is soft. There is no mass.     Tenderness: There is no abdominal tenderness. There is no right CVA tenderness, left CVA tenderness, guarding or rebound.     Hernia: No hernia is present.  Musculoskeletal:        General: No swelling, tenderness or deformity. Normal range of motion.     Cervical back: Normal range of motion and neck supple. No rigidity or tenderness.     Right lower leg: No edema.     Left lower leg: No edema.  Lymphadenopathy:     Cervical: No cervical adenopathy.  Skin:    General: Skin is warm and dry.     Coloration: Skin is not jaundiced or pale.  Findings: No bruising, erythema, lesion or rash.  Neurological:     General: No focal deficit present.     Mental Status: She is alert and oriented to person, place, and time. Mental status is at baseline.     Cranial Nerves: No cranial nerve deficit.     Sensory: No sensory deficit.     Motor: No weakness.     Coordination: Coordination normal.     Gait: Gait normal.     Deep Tendon Reflexes: Reflexes normal.  Psychiatric:        Mood and Affect: Mood normal.        Behavior: Behavior normal.        Thought Content: Thought content normal.        Judgment: Judgment normal.       Assessment/plan: KADIATOU COSSON is a 44 y.o. female  present for cpe and Chronic Conditions/illness Management Vit d: Vit d levels collected today Supplementing daily  Vitamin B12 deficiency/MTHFR mutation Continue B12 injections every 4 weeks by nurse visit x12.  Patient will follow yearly for her physicals. - Vitamin B12 collected today - b12 inj provided today  Need for Tdap vaccination - Tdap vaccine greater than or equal to 7yo IM Iron deficiency - IBC + Ferritin Lipid screening - Lipid panel Diabetes mellitus screening - Hemoglobin A1c Encounter for long-term current use of medication - Hemoglobin A1c - Lipid panel  Neck pain: - initial encounter 05/2023. Robaxin  helps, but pain continues to resurface.  - referral to PT placed today,  OR  Routine general medical examination at a health care facility Patient was encouraged to exercise greater than 150 minutes a week. Patient was encouraged to choose a diet filled with fresh fruits and vegetables, and lean meats. AVS provided to patient today for education/recommendation on gender specific health and safety maintenance. Colonoscopy:h/o chrons disease. Est w/ Dr. Hilton Lucky Mammogram:Completed at gyn - [facility]/ / Cervical cancer screening:08/2023. Completed at [facility]yn [facility] Immunizations: tdap-due> completed 10/04/2023, influenza vaccine UTD 2024, recommend Shingrix vaccines after 50. Infectious disease screening: HIV and Hep C declined  Return in about 1 year (around 10/04/2024) for cpe (20 min).  Orders Placed This Encounter  Procedures   Tdap vaccine greater than or equal to 7yo IM   CBC   Comprehensive metabolic panel with GFR   Hemoglobin A1c   Lipid panel   TSH   Vitamin D  (25 hydroxy)   B12 and Folate Panel   IBC + Ferritin   Ambulatory referral to Physical Therapy   Meds ordered this encounter  Medications   cyanocobalamin  (VITAMIN B12) injection 1,000 mcg   methocarbamol  (ROBAXIN ) 500 MG tablet    Sig: Take 1 tablet (500 mg total) by mouth 2 (two) times  daily as needed for muscle spasms.    Dispense:  60 tablet    Refill:  1   Referral Orders         Ambulatory referral to Physical Therapy       Note is dictated utilizing voice recognition software. Although note has been proof read prior to signing, occasional typographical errors still can be missed. If any questions arise, please do not hesitate to call for verification.  Electronically signed by: Napolean Backbone, DO [facility] [place]

## 2023-10-04 NOTE — Patient Instructions (Addendum)

## 2023-10-05 ENCOUNTER — Encounter: Payer: Self-pay | Admitting: Family Medicine

## 2023-11-03 ENCOUNTER — Encounter: Payer: Self-pay | Admitting: Family Medicine

## 2023-11-03 ENCOUNTER — Ambulatory Visit: Admitting: Family Medicine

## 2023-11-03 VITALS — BP 122/78 | HR 90 | Temp 97.9°F | Wt 129.4 lb

## 2023-11-03 DIAGNOSIS — F418 Other specified anxiety disorders: Secondary | ICD-10-CM | POA: Diagnosis not present

## 2023-11-03 DIAGNOSIS — G479 Sleep disorder, unspecified: Secondary | ICD-10-CM | POA: Diagnosis not present

## 2023-11-03 DIAGNOSIS — E538 Deficiency of other specified B group vitamins: Secondary | ICD-10-CM | POA: Diagnosis not present

## 2023-11-03 MED ORDER — TRAZODONE HCL 50 MG PO TABS: 25.0000 mg | ORAL_TABLET | Freq: Every evening | ORAL | 1 refills | Status: AC | PRN

## 2023-11-03 MED ORDER — CYANOCOBALAMIN 1000 MCG/ML IJ SOLN
1000.0000 ug | Freq: Once | INTRAMUSCULAR | Status: AC
  Administered 2023-11-03: 1000 ug via INTRAMUSCULAR

## 2023-11-03 MED ORDER — AMBIEN CR 6.25 MG PO TBCR: 6.2500 mg | EXTENDED_RELEASE_TABLET | Freq: Once | ORAL | 1 refills | Status: DC

## 2023-11-03 MED ORDER — BUPROPION HCL ER (XL) 300 MG PO TB24: 300.0000 mg | ORAL_TABLET | Freq: Every morning | ORAL | 1 refills | Status: DC

## 2023-11-03 NOTE — Patient Instructions (Addendum)
 Return in about 24 weeks (around 04/19/2024) for Routine chronic condition follow-up.        Great to see you today.  I have refilled the medication(s) we provide.   If labs were collected or images ordered, we will inform you of  results once we have received them and reviewed. We will contact you either by echart message, or telephone call.  Please give ample time to the testing facility, and our office to run,  receive and review results. Please do not call inquiring of results, even if you can see them in your chart. We will contact you as soon as we are able. If it has been over 1 week since the test was completed, and you have not yet heard from us , then please call us .    - echart message- for normal results that have been seen by the patient already.   - telephone call: abnormal results or if patient has not viewed results in their echart.  If a referral to a specialist was entered for you, please call us  in 2 weeks if you have not heard from the specialist office to schedule.

## 2023-11-03 NOTE — Progress Notes (Signed)
 Sophia Beard , [birth year], 43 y.o., female MRN: [id number] Patient Care Team    Relationship Specialty Notifications Start End  Mariel Shope, DO PCP - General Family Medicine  04/15/22   Meriam Stamp, MD Referring Physician Obstetrics and Gynecology  11/25/15   Sergio Dandy, MD Consulting Physician Gastroenterology  04/15/22     Chief Complaint  Patient presents with   Depression   Anxiety    Pt wants to discuss her medications that are prescribed by her GYN to be covered by PCP.      Subjective: Sophia Beard is a 43 y.o. Pt presents for an OV to discuss her chronic conditions.  Conditions were treated by gynecology prior, she would like to transfer the care of her chronic conditions to this provider today.  Depression/anxiety/sleep disturbance: Patient reports she had been on Wellbutrin  for some time, 150 mg was not adequate and it was increased Wellbutrin  300 mg.  She reports it is working well for her and she would like to continue this medication. She does endorse having sleep disturbances in which she will not be able to sleep well for a few nights in a row and then eventually she will take an Ambien to break the cycle.  She states she has been on Ambien for some time and it used to work well for her.  There are occasions now where Ambien still does not make her fall asleep.  She states she sometimes cannot seem to shut down her brain in order to fall asleep. She has tried over-the-counter sleep aids which were not helpful.     10/04/2023    8:38 AM 06/23/2023   10:16 AM 10/03/2022    8:33 AM 04/18/2022   10:04 AM  Depression screen PHQ 2/9  Decreased Interest 0 1 0 1  Down, Depressed, Hopeless 1 1 0 1  PHQ - 2 Score 1 2 0 2  Altered sleeping 2 2 2 2   Tired, decreased energy 1 2 1 1   Change in appetite 0 1 1 0  Feeling bad or failure about yourself  1 1 1 1   Trouble concentrating 0 1 0 0  Moving slowly or fidgety/restless 0 0 0 0  Suicidal thoughts 0 0  0 0  PHQ-9 Score 5 9 5 6   Difficult doing work/chores Somewhat difficult Not difficult at all        10/04/2023    8:38 AM 06/23/2023   10:16 AM 04/18/2022   10:04 AM  GAD 7 : Generalized Anxiety Score  Nervous, Anxious, on Edge 1 1 1   Control/stop worrying 1 1 1   Worry too much - different things 1 1 1   Trouble relaxing 2 1 1   Restless 0 1 1  Easily annoyed or irritable 1 1 1   Afraid - awful might happen 1 1 1   Total GAD 7 Score 7 7 7   Anxiety Difficulty Somewhat difficult Not difficult at all     Allergies  Allergen Reactions   Iron Dextran Anaphylaxis   Sulfonamide Derivatives Hives   Social History   Social History Narrative   Marital status/children/pets: Married, 2 children   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, Nature conservation officer:      -smoke alarm in the home:Yes     - wears seatbelt: Yes     - Feels safe in their relationships: Yes      Past Medical History:  Diagnosis Date   Anal fissure    Anemia  not presently   Anxiety    Asthma    exercise induced   COVID-19 11/10/2020   Crohn disease (HCC)    Ileocecetomy 2015   Depression    Family history of adverse reaction to anesthesia    mother / sister have nausea   GERD (gastroesophageal reflux disease)    GI bleed    Headache    Ovarian cyst    PONV (postoperative nausea and vomiting)    Protein S deficiency (HCC) 11/2015   Raynaud's disease    Past Surgical History:  Procedure Laterality Date   CHOLECYSTECTOMY N/A 10/13/2020   Procedure: LAPAROSCOPIC CHOLECYSTECTOMY WITH INTRAOPERATIVE CHOLANGIOGRAM;  Surgeon: Adalberto Acton, MD;  Location: [facility];  Service: General;  Laterality: N/A;   COLONOSCOPY  2022   DILATION AND EVACUATION N/A 06/17/2016   Procedure: DILATATION AND EVACUATION;  Surgeon: Meriam Stamp, MD;  Location: [facility];  Service: Gynecology;  Laterality: N/A;   ESOPHAGOGASTRODUODENOSCOPY ENDOSCOPY     ILEOCECETOMY  2015   Crohn s disease   LAPAROSCOPIC ILEOCECECTOMY N/A  04/21/2014   Procedure: LAPAROSCOPIC ASSISTED ILEOCYSTECTOMY;  Surgeon: Adalberto Hollow, MD;  Location: [facility];  Service: General;  Laterality: N/A;   WISDOM TOOTH EXTRACTION     Family History  Problem Relation Age of Onset   Heart disease Mother    Depression Mother    Skin cancer Mother    Hypertension Mother    Hypertension Father    Skin cancer Father    Miscarriages / Stillbirths Sister    Depression Sister    Asthma Sister    Irritable bowel syndrome Sister    Heart disease Paternal Grandfather    Depression Daughter    Asthma Daughter    Asthma Daughter    Colon cancer Paternal Uncle    Esophageal cancer Neg Hx    Stomach cancer Neg Hx    Rectal cancer Neg Hx    Allergies as of 11/03/2023       Reactions   Iron Dextran Anaphylaxis   Sulfonamide Derivatives Hives        Medication List        Accurate as of November 03, 2023 12:35 PM. If you have any questions, ask your nurse or doctor.          Ambien CR 6.25 MG CR tablet Generic drug: zolpidem Take 1 tablet (6.25 mg total) by mouth once for 1 dose. What changed: See the new instructions. Changed by: Napolean Backbone   buPROPion  300 MG 24 hr tablet Commonly known as: WELLBUTRIN  XL Take 1 tablet (300 mg total) by mouth in the morning.   methocarbamol  500 MG tablet Commonly known as: ROBAXIN  Take 1 tablet (500 mg total) by mouth 2 (two) times daily as needed for muscle spasms.   traZODone  50 MG tablet Commonly known as: DESYREL  Take 0.5-1.5 tablets (25-75 mg total) by mouth at bedtime as needed for sleep. Started by: Napolean Backbone   Vitamin D3 125 MCG (5000 UT) Chew Chew by mouth.        All past medical history, surgical history, allergies, family history, immunizations andmedications were updated in the EMR today and reviewed under the history and medication portions of their EMR.     ROS Negative, with the exception of above mentioned in HPI   Objective:  BP 122/78   Pulse 90   Temp 97.9 F  (36.6 C)   Wt 129 lb 6.4 oz (58.7 kg)   LMP 10/03/2023 (Exact Date)  SpO2 99%   BMI 23.67 kg/m  Body mass index is 23.67 kg/m. Physical Exam Vitals and nursing note reviewed.  Constitutional:      General: She is not in acute distress.    Appearance: Normal appearance. She is normal weight. She is not ill-appearing or toxic-appearing.  HENT:     Head: Normocephalic and atraumatic.  Eyes:     General: No scleral icterus.       Right eye: No discharge.        Left eye: No discharge.     Extraocular Movements: Extraocular movements intact.     Conjunctiva/sclera: Conjunctivae normal.     Pupils: Pupils are equal, round, and reactive to light.  Skin:    Findings: No rash.  Neurological:     Mental Status: She is alert and oriented to person, place, and time. Mental status is at baseline.     Motor: No weakness.     Coordination: Coordination normal.     Gait: Gait normal.  Psychiatric:        Mood and Affect: Mood normal.        Behavior: Behavior normal.        Thought Content: Thought content normal.        Judgment: Judgment normal.      No results found. No results found. No results found for this or any previous visit (from the past 24 hours).  Assessment/Plan: Sophia Beard is a 43 y.o. female present for OV for  Vitamin B12 deficiency - cyanocobalamin  (VITAMIN B12) injection 1,000 mcg Continue scheduled B12 injections every 14 days Depression with anxiety (Primary) Stable Continue Wellbutrin  300 mg XL daily Sleep disturbance Discussed options with her today. Elected to go ahead and refill the Ambien for her today, it is not working as well as it used to for her, therefore we discussed trying trazodone  in its place and she is agreeable to this.  She has Ambien in the event she needs a backup if the trazodone  does not work well for her.  She understands not to use these 2 medications together. Start trazodone  taper 25-75 mg nightly.  Reviewed expectations re:  course of current medical issues. Discussed self-management of symptoms. Outlined signs and symptoms indicating need for more acute intervention. Patient verbalized understanding and all questions were answered. Patient received an After-Visit Summary.  Return in about 24 weeks (around 04/19/2024) for Routine chronic condition follow-up.   No orders of the defined types were placed in this encounter.  Meds ordered this encounter  Medications   cyanocobalamin  (VITAMIN B12) injection 1,000 mcg   buPROPion  (WELLBUTRIN  XL) 300 MG 24 hr tablet    Sig: Take 1 tablet (300 mg total) by mouth in the morning.    Dispense:  90 tablet    Refill:  1   AMBIEN CR 6.25 MG CR tablet    Sig: Take 1 tablet (6.25 mg total) by mouth once for 1 dose.    Dispense:  30 tablet    Refill:  1   traZODone  (DESYREL ) 50 MG tablet    Sig: Take 0.5-1.5 tablets (25-75 mg total) by mouth at bedtime as needed for sleep.    Dispense:  135 tablet    Refill:  1   Referral Orders  No referral(s) requested today     Note is dictated utilizing voice recognition software. Although note has been proof read prior to signing, occasional typographical errors still can be missed. If any questions arise, please do  not hesitate to call for verification.   electronically signed by:  Napolean Backbone, DO  [facility] [place]

## 2023-11-10 ENCOUNTER — Telehealth: Payer: Self-pay

## 2023-11-10 MED ORDER — ZOLPIDEM TARTRATE ER 6.25 MG PO TBCR: 6.2500 mg | EXTENDED_RELEASE_TABLET | Freq: Every evening | ORAL | 5 refills | Status: AC | PRN

## 2023-11-10 NOTE — Telephone Encounter (Signed)
 Spoke with the pharmacy and he states her insurance will cover Generic but not brand name   Copied from CRM [id number]. Topic: Clinical - Medication Question >> Nov 09, 2023  4:14 PM Abigail D wrote: Reason for CRM: Patient is having trouble getting AMBIEN  CR 6.25 MG CR tablet covered through her insurance, she was on zolpidem  extended release 6.25mg  and is wondering if that could be prescribed.

## 2023-11-10 NOTE — Telephone Encounter (Signed)
 Patient is returning a call from the office

## 2023-11-10 NOTE — Telephone Encounter (Signed)
 Change ambien  to generic for her

## 2023-11-10 NOTE — Addendum Note (Signed)
 Addended by: Napolean Backbone A on: 11/10/2023 05:12 PM   Modules accepted: Orders

## 2023-11-10 NOTE — Telephone Encounter (Signed)
LVM to discuss w pt

## 2023-11-14 NOTE — Telephone Encounter (Signed)
 No further action needed at this time.

## 2023-12-08 ENCOUNTER — Ambulatory Visit (INDEPENDENT_AMBULATORY_CARE_PROVIDER_SITE_OTHER)

## 2023-12-08 DIAGNOSIS — E538 Deficiency of other specified B group vitamins: Secondary | ICD-10-CM

## 2023-12-08 NOTE — Progress Notes (Signed)
 Pt here for monthly B12 injection per Dr. Catherine.   B12 1000mcg given IM left deltoid, and pt tolerated injection well.   Next B12 injection scheduled for 1 month.  Please sign in absence of PCP

## 2024-01-05 ENCOUNTER — Ambulatory Visit

## 2024-01-05 DIAGNOSIS — E538 Deficiency of other specified B group vitamins: Secondary | ICD-10-CM

## 2024-01-05 NOTE — Progress Notes (Signed)
 Pt here for monthly B12 injection per Dr. Kuneff  B12 1000mcg given IM, and pt tolerated injection well.  Next B12 injection scheduled for:  9/5

## 2024-02-02 ENCOUNTER — Ambulatory Visit

## 2024-02-02 DIAGNOSIS — E538 Deficiency of other specified B group vitamins: Secondary | ICD-10-CM | POA: Diagnosis not present

## 2024-02-02 NOTE — Progress Notes (Signed)
 Pt here for monthly B12 injection per Dr. Kuneff   B12 1000mcg given IM, and pt tolerated injection well.   Next B12 injection scheduled for:  10/03

## 2024-03-01 ENCOUNTER — Ambulatory Visit

## 2024-03-04 ENCOUNTER — Ambulatory Visit: Admitting: Family Medicine

## 2024-03-04 ENCOUNTER — Encounter: Payer: Self-pay | Admitting: Family Medicine

## 2024-03-04 VITALS — BP 120/76 | HR 79 | Temp 97.9°F | Wt 130.0 lb

## 2024-03-04 DIAGNOSIS — Z23 Encounter for immunization: Secondary | ICD-10-CM | POA: Diagnosis not present

## 2024-03-04 DIAGNOSIS — E538 Deficiency of other specified B group vitamins: Secondary | ICD-10-CM

## 2024-03-04 DIAGNOSIS — R350 Frequency of micturition: Secondary | ICD-10-CM

## 2024-03-04 LAB — POC URINALSYSI DIPSTICK (AUTOMATED)
Bilirubin, UA: NEGATIVE
Glucose, UA: NEGATIVE
Nitrite, UA: NEGATIVE
Protein, UA: NEGATIVE
Spec Grav, UA: 1.02 (ref 1.010–1.025)
Urobilinogen, UA: NEGATIVE U/dL — AB
pH, UA: 6 (ref 5.0–8.0)

## 2024-03-04 MED ORDER — CYANOCOBALAMIN 1000 MCG/ML IJ SOLN
1000.0000 ug | Freq: Once | INTRAMUSCULAR | Status: AC
  Administered 2024-03-04: 1000 ug via INTRAMUSCULAR

## 2024-03-04 MED ORDER — NITROFURANTOIN MONOHYD MACRO 100 MG PO CAPS: 100.0000 mg | ORAL_CAPSULE | Freq: Two times a day (BID) | ORAL | 0 refills | Status: AC

## 2024-03-04 NOTE — Progress Notes (Signed)
 Sophia Beard , 1981/05/17, 43 y.o., female MRN: [id number] Patient Care Team    Relationship Specialty Notifications Start End  Catherine Sophia LABOR, DO PCP - General Family Medicine  04/15/22   Gorge Ade, MD Referring Physician Obstetrics and Gynecology  11/25/15   Shila Gustav GAILS, MD Consulting Physician Gastroenterology  04/15/22     Chief Complaint  Patient presents with   Urinary Frequency    3 weeks; abdominal pressure, urinary frequency. Pt has tried AZO and cranberry.      Subjective: Sophia Beard is a 43 y.o. Pt presents for an OV with complaints of urinary frequency  of 3 weeks duration.  Associated symptoms include abd pressure. Pt has tried azo and cranberry juice  Had frequent kidney infections as a kid.  Patient denies fevers, chills or low back pain.     10/04/2023    8:38 AM 06/23/2023   10:16 AM 10/03/2022    8:33 AM 04/18/2022   10:04 AM  Depression screen PHQ 2/9  Decreased Interest 0 1 0 1  Down, Depressed, Hopeless 1 1 0 1  PHQ - 2 Score 1 2 0 2  Altered sleeping 2 2 2 2   Tired, decreased energy 1 2 1 1   Change in appetite 0 1 1 0  Feeling bad or failure about yourself  1 1 1 1   Trouble concentrating 0 1 0 0  Moving slowly or fidgety/restless 0 0 0 0  Suicidal thoughts 0 0 0 0  PHQ-9 Score 5 9 5 6   Difficult doing work/chores Somewhat difficult Not difficult at all      Allergies  Allergen Reactions   Iron Dextran Anaphylaxis   Sulfonamide Derivatives Hives   Social History   Social History Narrative   Marital status/children/pets: Married, 2 children   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, Nature conservation officer:      -smoke alarm in the home:Yes     - wears seatbelt: Yes     - Feels safe in their relationships: Yes      Past Medical History:  Diagnosis Date   Anal fissure    Anemia    not presently   Anxiety    Asthma    exercise induced   COVID-19 11/10/2020   Crohn disease (HCC)    Ileocecetomy 2015   Depression     Family history of adverse reaction to anesthesia    mother / sister have nausea   GERD (gastroesophageal reflux disease)    GI bleed    Headache    Ovarian cyst    PONV (postoperative nausea and vomiting)    Protein S deficiency 11/2015   Raynaud's disease    Past Surgical History:  Procedure Laterality Date   CHOLECYSTECTOMY N/A 10/13/2020   Procedure: LAPAROSCOPIC CHOLECYSTECTOMY WITH INTRAOPERATIVE CHOLANGIOGRAM;  Surgeon: Signe Mitzie LABOR, MD;  Location: [facility];  Service: General;  Laterality: N/A;   COLONOSCOPY  2022   DILATION AND EVACUATION N/A 06/17/2016   Procedure: DILATATION AND EVACUATION;  Surgeon: Ade Gorge, MD;  Location: [facility];  Service: Gynecology;  Laterality: N/A;   ESOPHAGOGASTRODUODENOSCOPY ENDOSCOPY     ILEOCECETOMY  2015   Crohn s disease   LAPAROSCOPIC ILEOCECECTOMY N/A 04/21/2014   Procedure: LAPAROSCOPIC ASSISTED ILEOCYSTECTOMY;  Surgeon: Krystal Russell, MD;  Location: [facility];  Service: General;  Laterality: N/A;   WISDOM TOOTH EXTRACTION     Family History  Problem Relation Age of Onset   Heart disease Mother  Depression Mother    Skin cancer Mother    Hypertension Mother    Hypertension Father    Skin cancer Father    Miscarriages / Stillbirths Sister    Depression Sister    Asthma Sister    Irritable bowel syndrome Sister    Heart disease Paternal Grandfather    Depression Daughter    Asthma Daughter    Asthma Daughter    Colon cancer Paternal Uncle    Esophageal cancer Neg Hx    Stomach cancer Neg Hx    Rectal cancer Neg Hx    Allergies as of 03/04/2024       Reactions   Iron Dextran Anaphylaxis   Sulfonamide Derivatives Hives        Medication List        Accurate as of March 04, 2024  1:30 PM. If you have any questions, ask your nurse or doctor.          buPROPion  300 MG 24 hr tablet Commonly known as: WELLBUTRIN  XL Take 1 tablet (300 mg total) by mouth in the morning.   methocarbamol  500 MG tablet Commonly  known as: ROBAXIN  Take 1 tablet (500 mg total) by mouth 2 (two) times daily as needed for muscle spasms.   nitrofurantoin (macrocrystal-monohydrate) 100 MG capsule Commonly known as: Macrobid Take 1 capsule (100 mg total) by mouth 2 (two) times daily. Started by: Sophia Beard   traZODone  50 MG tablet Commonly known as: DESYREL  Take 0.5-1.5 tablets (25-75 mg total) by mouth at bedtime as needed for sleep.   Vitamin D3 125 MCG (5000 UT) Chew Chew by mouth.   zolpidem  6.25 MG CR tablet Commonly known as: AMBIEN  CR Take 1 tablet (6.25 mg total) by mouth at bedtime as needed for sleep.        All past medical history, surgical history, allergies, family history, immunizations andmedications were updated in the EMR today and reviewed under the history and medication portions of their EMR.     Review of Systems  Constitutional:  Negative for chills, fever and malaise/fatigue.  Gastrointestinal:  Positive for abdominal pain. Negative for diarrhea, nausea and vomiting.  Genitourinary:  Positive for dysuria and frequency. Negative for flank pain.  Neurological:  Negative for dizziness and headaches.   Negative, with the exception of above mentioned in HPI   Objective:  BP 120/76   Pulse 79   Temp 97.9 F (36.6 C)   Wt 130 lb (59 kg)   SpO2 97%   BMI 23.78 kg/m  Body mass index is 23.78 kg/m. Physical Exam Vitals and nursing note reviewed.  Constitutional:      General: She is not in acute distress.    Appearance: Normal appearance. She is normal weight. She is not ill-appearing or toxic-appearing.  HENT:     Head: Normocephalic and atraumatic.  Eyes:     General: No scleral icterus.       Right eye: No discharge.        Left eye: No discharge.     Extraocular Movements: Extraocular movements intact.     Conjunctiva/sclera: Conjunctivae normal.     Pupils: Pupils are equal, round, and reactive to light.  Abdominal:     General: Abdomen is flat. Bowel sounds are  normal. There is no distension.     Palpations: Abdomen is soft.     Tenderness: There is no abdominal tenderness. There is no right CVA tenderness, left CVA tenderness, guarding or rebound.  Skin:  Findings: No rash.  Neurological:     Mental Status: She is alert and oriented to person, place, and time. Mental status is at baseline.     Motor: No weakness.     Coordination: Coordination normal.     Gait: Gait normal.  Psychiatric:        Mood and Affect: Mood normal.        Behavior: Behavior normal.        Thought Content: Thought content normal.        Judgment: Judgment normal.     No results found. No results found. Results for orders placed or performed in visit on 03/04/24 (from the past 24 hours)  POCT Urinalysis Dipstick (Automated)     Status: Abnormal   Collection Time: 03/04/24  1:18 PM  Result Value Ref Range   Color, UA yellow    Clarity, UA clear    Glucose, UA Negative Negative   Bilirubin, UA negative    Ketones, UA trace    Spec Grav, UA 1.020 1.010 - 1.025   Blood, UA 1+    pH, UA 6.0 5.0 - 8.0   Protein, UA Negative Negative   Urobilinogen, UA negative (A) 0.2 or 1.0 E.U./dL   Nitrite, UA negative    Leukocytes, UA Trace (A) Negative    Assessment/Plan: LORAYNE GETCHELL is a 43 y.o. female present for OV for  Urinary frequency (Primary) - POCT Urinalysis Dipstick (Automated)- +/- LEU, 1+ bld, trace ketones - Urinalysis w microscopic + reflex cultur- pending Rest. Hydrate Elected to start macrobid bid x5 days while waiting on cx   B12 deficiency B12 injection provided today - cyanocobalamin  (VITAMIN B12) injection 1,000 mcg  Influenza vaccine administered today  Reviewed expectations re: course of current medical issues. Discussed self-management of symptoms. Outlined signs and symptoms indicating need for more acute intervention. Patient verbalized understanding and all questions were answered. Patient received an After-Visit  Summary.    Orders Placed This Encounter  Procedures   Flu vaccine trivalent PF, 6mos and older(Flulaval,Afluria,Fluarix,Fluzone)   Urinalysis w microscopic + reflex cultur   POCT Urinalysis Dipstick (Automated)   Meds ordered this encounter  Medications   cyanocobalamin  (VITAMIN B12) injection 1,000 mcg   nitrofurantoin, macrocrystal-monohydrate, (MACROBID) 100 MG capsule    Sig: Take 1 capsule (100 mg total) by mouth 2 (two) times daily.    Dispense:  10 capsule    Refill:  0   Referral Orders  No referral(s) requested today     Note is dictated utilizing voice recognition software. Although note has been proof read prior to signing, occasional typographical errors still can be missed. If any questions arise, please do not hesitate to call for verification.   electronically signed by:  Sophia Bellini, DO  [facility] - [place]

## 2024-03-07 LAB — URINALYSIS W MICROSCOPIC + REFLEX CULTURE

## 2024-03-11 NOTE — Telephone Encounter (Signed)
 No further action needed at this time. Chart verified with information provided by pt

## 2024-04-02 ENCOUNTER — Ambulatory Visit (INDEPENDENT_AMBULATORY_CARE_PROVIDER_SITE_OTHER)

## 2024-04-02 DIAGNOSIS — E538 Deficiency of other specified B group vitamins: Secondary | ICD-10-CM

## 2024-04-02 MED ORDER — CYANOCOBALAMIN 1000 MCG/ML IJ SOLN
1000.0000 ug | INTRAMUSCULAR | Status: AC
  Administered 2024-04-02 – 2024-07-05 (×3): 1000 ug via INTRAMUSCULAR

## 2024-04-02 NOTE — Progress Notes (Signed)
Pt here for monthly B12 injection per Dr.Kuneff   B12 1000mcg given IM, and pt tolerated injection well.   Next B12 injection scheduled for 1 month.       

## 2024-04-19 ENCOUNTER — Ambulatory Visit: Admitting: Family Medicine

## 2024-04-30 ENCOUNTER — Ambulatory Visit

## 2024-05-02 ENCOUNTER — Ambulatory Visit

## 2024-05-02 DIAGNOSIS — E538 Deficiency of other specified B group vitamins: Secondary | ICD-10-CM

## 2024-05-02 MED ORDER — CYANOCOBALAMIN 1000 MCG/ML IJ SOLN
1000.0000 ug | Freq: Once | INTRAMUSCULAR | Status: AC
  Administered 2024-05-02: 1000 ug via INTRAMUSCULAR

## 2024-05-02 NOTE — Progress Notes (Signed)
Pt here for monthly B12 injection per Dr.Kuneff   B12 1000mcg given IM, and pt tolerated injection well.   Next B12 injection scheduled for 1 month.       

## 2024-05-14 ENCOUNTER — Other Ambulatory Visit: Payer: Self-pay | Admitting: Family Medicine

## 2024-05-14 ENCOUNTER — Other Ambulatory Visit: Payer: Self-pay

## 2024-05-14 MED ORDER — BUPROPION HCL ER (XL) 300 MG PO TB24: 300.0000 mg | ORAL_TABLET | Freq: Every morning | ORAL | 0 refills | Status: DC

## 2024-05-14 MED ORDER — BUPROPION HCL ER (XL) 300 MG PO TB24: 300.0000 mg | ORAL_TABLET | Freq: Every morning | ORAL | 0 refills | Status: AC

## 2024-05-14 NOTE — Telephone Encounter (Unsigned)
 Copied from CRM #[id number]. Topic: Clinical - Medication Refill >> May 14, 2024  4:12 PM Shereese L wrote: Medication: buPROPion  (WELLBUTRIN  XL) 300 MG 24 hr tablet  Has the patient contacted their pharmacy? Yes (Agent: If no, request that the patient contact the pharmacy for the refill. If patient does not wish to contact the pharmacy document the reason why and proceed with request.) (Agent: If yes, when and what did the pharmacy advise?)  This is the patient's preferred pharmacy:  [id number] - [place],  - [id number] [place] [address] Phone: [phone] Fax: [phone]  Is this the correct pharmacy for this prescription? Yes If no, delete pharmacy and type the correct one.   Has the prescription been filled recently? Yes  Is the patient out of the medication? Yes  Has the patient been seen for an appointment in the last year OR does the patient have an upcoming appointment? Yes  Can we respond through MyChart? Yes  Agent: Please be advised that Rx refills may take up to 3 business days. We ask that you follow-up with your pharmacy.

## 2024-06-07 ENCOUNTER — Ambulatory Visit: Admitting: Family Medicine

## 2024-06-07 ENCOUNTER — Ambulatory Visit

## 2024-06-07 DIAGNOSIS — E538 Deficiency of other specified B group vitamins: Secondary | ICD-10-CM

## 2024-06-07 NOTE — Progress Notes (Signed)
 Pt here for monthly B12 injection per original order.  Last B12 injection:  12/4  B12 1000mcg given IM, and pt tolerated injection well.  Next B12 injection scheduled for: 1 month

## 2024-07-05 ENCOUNTER — Ambulatory Visit

## 2024-07-05 NOTE — Progress Notes (Signed)
 Pt here for monthly B12 injection per original order.   Last B12 injection:  06/07/24   B12 1000mcg given IM, and pt tolerated injection well.   Next B12 injection scheduled for: 1 month

## 2024-08-02 ENCOUNTER — Ambulatory Visit: Admitting: Family Medicine

## 2024-10-07 ENCOUNTER — Encounter: Admitting: Family Medicine
